# Patient Record
Sex: Female | Born: 1945 | Race: White | Hispanic: No | Smoking: Former smoker
Health system: Southern US, Community
[De-identification: ages and names within clinical notes are randomized; demographics above are authoritative.]

## PROBLEM LIST (undated history)

## (undated) DIAGNOSIS — E785 Hyperlipidemia, unspecified: Secondary | ICD-10-CM

## (undated) DIAGNOSIS — I213 ST elevation (STEMI) myocardial infarction of unspecified site: Secondary | ICD-10-CM

## (undated) DIAGNOSIS — I10 Essential (primary) hypertension: Secondary | ICD-10-CM

## (undated) DIAGNOSIS — I5021 Acute systolic (congestive) heart failure: Secondary | ICD-10-CM

## (undated) HISTORY — PX: OVARY SURGERY: SHX727

## (undated) HISTORY — PX: FRACTURE SURGERY: SHX138

---

## 2001-09-13 ENCOUNTER — Encounter: Payer: Self-pay | Admitting: Obstetrics and Gynecology

## 2001-09-13 ENCOUNTER — Ambulatory Visit (HOSPITAL_COMMUNITY): Admission: RE | Admit: 2001-09-13 | Discharge: 2001-09-13 | Payer: Self-pay | Admitting: Obstetrics and Gynecology

## 2001-09-22 ENCOUNTER — Other Ambulatory Visit: Admission: RE | Admit: 2001-09-22 | Discharge: 2001-09-22 | Payer: Self-pay | Admitting: Obstetrics and Gynecology

## 2001-12-28 ENCOUNTER — Ambulatory Visit: Admission: RE | Admit: 2001-12-28 | Discharge: 2001-12-28 | Payer: Self-pay | Admitting: Obstetrics and Gynecology

## 2009-08-07 ENCOUNTER — Ambulatory Visit (HOSPITAL_COMMUNITY): Admission: RE | Admit: 2009-08-07 | Discharge: 2009-08-07 | Payer: Self-pay | Admitting: Family Medicine

## 2009-08-07 ENCOUNTER — Encounter: Payer: Self-pay | Admitting: Family Medicine

## 2011-05-26 ENCOUNTER — Other Ambulatory Visit (HOSPITAL_COMMUNITY): Payer: Self-pay | Admitting: Family Medicine

## 2011-05-26 DIAGNOSIS — Z139 Encounter for screening, unspecified: Secondary | ICD-10-CM

## 2011-05-29 ENCOUNTER — Ambulatory Visit (HOSPITAL_COMMUNITY)
Admission: RE | Admit: 2011-05-29 | Discharge: 2011-05-29 | Disposition: A | Discharge status: Home / Self Care | Payer: BC Managed Care – PPO | Source: Ambulatory Visit | Attending: Family Medicine | Admitting: Family Medicine

## 2011-05-29 DIAGNOSIS — Z1231 Encounter for screening mammogram for malignant neoplasm of breast: Secondary | ICD-10-CM | POA: Insufficient documentation

## 2011-05-29 DIAGNOSIS — Z139 Encounter for screening, unspecified: Secondary | ICD-10-CM

## 2012-07-16 ENCOUNTER — Ambulatory Visit (HOSPITAL_COMMUNITY)
Admission: RE | Admit: 2012-07-16 | Discharge: 2012-07-16 | Disposition: A | Discharge status: Home / Self Care | Payer: BC Managed Care – PPO | Source: Ambulatory Visit | Attending: Family Medicine | Admitting: Family Medicine

## 2012-07-16 ENCOUNTER — Other Ambulatory Visit (HOSPITAL_COMMUNITY): Payer: Self-pay | Admitting: Family Medicine

## 2012-07-16 DIAGNOSIS — R52 Pain, unspecified: Secondary | ICD-10-CM

## 2012-07-16 DIAGNOSIS — M25569 Pain in unspecified knee: Secondary | ICD-10-CM | POA: Insufficient documentation

## 2013-08-01 ENCOUNTER — Other Ambulatory Visit (HOSPITAL_COMMUNITY): Payer: Self-pay | Admitting: Family Medicine

## 2013-08-01 DIAGNOSIS — Z139 Encounter for screening, unspecified: Secondary | ICD-10-CM

## 2013-08-03 ENCOUNTER — Ambulatory Visit (HOSPITAL_COMMUNITY)
Admission: RE | Admit: 2013-08-03 | Discharge: 2013-08-03 | Disposition: A | Discharge status: Home / Self Care | Payer: BC Managed Care – PPO | Source: Ambulatory Visit | Attending: Family Medicine | Admitting: Family Medicine

## 2013-08-03 DIAGNOSIS — Z139 Encounter for screening, unspecified: Secondary | ICD-10-CM

## 2013-08-03 DIAGNOSIS — Z1231 Encounter for screening mammogram for malignant neoplasm of breast: Secondary | ICD-10-CM | POA: Insufficient documentation

## 2013-08-04 ENCOUNTER — Ambulatory Visit (HOSPITAL_COMMUNITY): Payer: BC Managed Care – PPO

## 2014-07-23 ENCOUNTER — Other Ambulatory Visit (HOSPITAL_COMMUNITY): Payer: Self-pay | Admitting: Family Medicine

## 2014-07-23 DIAGNOSIS — Z1231 Encounter for screening mammogram for malignant neoplasm of breast: Secondary | ICD-10-CM

## 2014-08-09 ENCOUNTER — Ambulatory Visit (HOSPITAL_COMMUNITY): Payer: BC Managed Care – PPO

## 2014-08-20 ENCOUNTER — Ambulatory Visit (HOSPITAL_COMMUNITY)
Admission: RE | Admit: 2014-08-20 | Discharge: 2014-08-20 | Disposition: A | Discharge status: Home / Self Care | Payer: BC Managed Care – PPO | Source: Ambulatory Visit | Attending: Family Medicine | Admitting: Family Medicine

## 2014-08-20 DIAGNOSIS — Z1231 Encounter for screening mammogram for malignant neoplasm of breast: Secondary | ICD-10-CM | POA: Diagnosis not present

## 2014-12-29 ENCOUNTER — Encounter: Payer: Self-pay | Admitting: Podiatry

## 2014-12-29 ENCOUNTER — Ambulatory Visit (INDEPENDENT_AMBULATORY_CARE_PROVIDER_SITE_OTHER): Payer: BC Managed Care – PPO | Admitting: Podiatry

## 2014-12-29 VITALS — BP 155/81 | HR 73 | Resp 18

## 2014-12-29 DIAGNOSIS — B351 Tinea unguium: Secondary | ICD-10-CM

## 2014-12-29 DIAGNOSIS — L600 Ingrowing nail: Secondary | ICD-10-CM

## 2014-12-29 MED ORDER — TAVABOROLE 5 % EX SOLN: 1.0000 [drp] | CUTANEOUS | Status: DC

## 2014-12-29 NOTE — Progress Notes (Signed)
   Subjective:    Patient ID: Rachel Kennedy, female    DOB: 04/30/1946, 69 y.o.   MRN: [id number]  HPI  69 year old phenol presents the office today with complaints of bilateral ingrown toenails on both of her big toes. She states that areas are not particularly painful and that she is to hit her toe. She states that she previously had a partial nail avulsion with a chemical wanted to help prevent the corner from reoccurring on the right inside border however since then she states that the right big toenail has started to grow curve towards the outside is abutting the second digit. She denies any recent redness or drainage from the nail sites. No other complaints at this time.   Review of Systems  All other systems reviewed and are negative.      Objective:   Physical Exam AAO x3, NAD DP/PT pulses palpable bilaterally, CRT less than 3 seconds Protective sensation intact with Simms Weinstein monofilament, vibratory sensation intact, Achilles tendon reflex intact Nails are hypertrophic, yellow discoloration, dystrophic 10. There is incurvation of both the medial and lateral nail borders of all nails. The right hallux nail medial nail border has previously been removed. The right hallux nail is laterally deviated and is abutting the second digit. There is significant ingrowing along the medial border of the left hallux.There is no swelling erythema or drainage from the nail sites.  Currently, there is no tenderness to palpation along the nail border sites. No overlying edema, erythema, increased warmth bilateral lower extremities. No areas of pinpoint bony tenderness to bilateral lower extremities. MMT 5/5, ROM WNL.  No pain with calf compression, swelling, warmth, erythema.      Assessment & Plan:  69 year old female with ingrown toenail deformities, likely onychomycosis. -Treatment options were discussed with the patient including alternatives, risks, complications. - Nails were sharply  debrided 10 without complications/bleeding to patient comfort.  -Discussed with her that she likely has nail fungus. Discussed various treatments of nail fungus. At this time she is not to proceed with topical treatment and she is allowed to proceed with Rachel Kennedy. Prescription for this was sent to [facility] and she was given information to follow-up at the pharmacy. Side effects the medication were discussed the patient and directed to stop immediately if any are to occur and call the office. -Discussed possible partial nail avulsions with chemical matricectomy to help prevent the nail borders from reoccurring. Will continue to monitor the area. If she has reoccurrence or pain to the area she will consider doing this. -Follow-up as needed. In the meantime, encouraged call the office with any questions, concerns, changes symptoms. -

## 2014-12-29 NOTE — Patient Instructions (Signed)
Onychomycosis/Fungal Toenails  WHAT IS IT? An infection that lies within the keratin of your nail plate that is caused by a fungus.  WHY ME? Fungal infections affect all ages, sexes, races, and creeds.  There may be many factors that predispose you to a fungal infection such as age, coexisting medical conditions such as diabetes, or an autoimmune disease; stress, medications, fatigue, genetics, etc.  Bottom line: fungus thrives in a warm, moist environment and your shoes offer such a location.  IS IT CONTAGIOUS? Theoretically, yes.  You do not want to share shoes, nail clippers or files with someone who has fungal toenails.  Walking around barefoot in the same room or sleeping in the same bed is unlikely to transfer the organism.  It is important to realize, however, that fungus can spread easily from one nail to the next on the same foot.  HOW DO WE TREAT THIS?  There are several ways to treat this condition.  Treatment may depend on many factors such as age, medications, pregnancy, liver and kidney conditions, etc.  It is best to ask your doctor which options are available to you.  1. No treatment.   Unlike many other medical concerns, you can live with this condition.  However for many people this can be a painful condition and may lead to ingrown toenails or a bacterial infection.  It is recommended that you keep the nails cut short to help reduce the amount of fungal nail. 2. Topical treatment.  These range from herbal remedies to prescription strength nail lacquers.  About 40-50% effective, topicals require twice daily application for approximately 9 to 12 months or until an entirely new nail has grown out.  The most effective topicals are medical grade medications available through physicians offices. 3. Oral antifungal medications.  With an 80-90% cure rate, the most common oral medication requires 3 to 4 months of therapy and stays in your system for a year as the new nail grows out.  Oral  antifungal medications do require blood work to make sure it is a safe drug for you.  A liver function panel will be performed prior to starting the medication and after the first month of treatment.  It is important to have the blood work performed to avoid any harmful side effects.  In general, this medication safe but blood work is required. 4. Laser Therapy.  This treatment is performed by applying a specialized laser to the affected nail plate.  This therapy is noninvasive, fast, and non-painful.  It is not covered by insurance and is therefore, out of pocket.  The results have been very good with a 80-95% cure rate.  [facility] is the only practice in the area to offer this therapy. Permanent Nail Avulsion.  Removing the entire nail so that a new nail will not grow back.Ingrown Toenail An ingrown toenail occurs when the sharp edge of your toenail grows into the skin. Causes of ingrown toenails include toenails clipped too far back or poorly fitting shoes. Activities involving sudden stops (basketball, tennis) causing "toe jamming" may lead to an ingrown nail. HOME CARE INSTRUCTIONS  5. Soak the whole foot in warm soapy water for 20 minutes, 3 times per day. 6. You may lift the edge of the nail away from the sore skin by wedging a small piece of cotton under the corner of the nail. Be careful not to dig (traumatize) and cause more injury to the area. 7. Wear shoes that fit well.  While the ingrown nail is causing problems, sandals may be beneficial. 8. Trim your toenails regularly and carefully. Cut your toenails straight across, not in a curve. This will prevent injury to the skin at the corners of the toenail. 9. Keep your feet clean and dry. 10. Crutches may be helpful early in treatment if walking is painful. 11. Antibiotics, if prescribed, should be taken as directed. 12. Return for a wound check in 2 days or as directed. 13. Only take over-the-counter or prescription medicines for  pain, discomfort, or fever as directed by your caregiver. SEEK IMMEDIATE MEDICAL CARE IF:   You have a fever.  You have increasing pain, redness, swelling, or heat at the wound site.  Your toe is not better in 7 days. If conservative treatment is not successful, surgical removal of a portion or all of the nail may be necessary. MAKE SURE YOU:   Understand these instructions.  Will watch your condition.  Will get help right away if you are not doing well or get worse. Document Released: 12/04/2000 Document Revised: 02/29/2012 Document Reviewed: 11/28/2008 [facility] Patient Information 2015 [place], [place]. This information is not intended to replace advice given to you by your health care provider. Make sure you discuss any questions you have with your health care provider.

## 2015-12-06 ENCOUNTER — Other Ambulatory Visit: Payer: Self-pay | Admitting: Family Medicine

## 2015-12-10 ENCOUNTER — Other Ambulatory Visit: Payer: Self-pay

## 2015-12-11 ENCOUNTER — Other Ambulatory Visit: Payer: Self-pay | Admitting: Family Medicine

## 2016-03-30 ENCOUNTER — Other Ambulatory Visit (HOSPITAL_COMMUNITY): Payer: Self-pay | Admitting: Family Medicine

## 2016-03-30 DIAGNOSIS — Z1231 Encounter for screening mammogram for malignant neoplasm of breast: Secondary | ICD-10-CM

## 2016-04-01 ENCOUNTER — Ambulatory Visit (HOSPITAL_COMMUNITY)
Admission: RE | Admit: 2016-04-01 | Discharge: 2016-04-01 | Disposition: A | Discharge status: Home / Self Care | Payer: BC Managed Care – PPO | Source: Ambulatory Visit | Attending: Family Medicine | Admitting: Family Medicine

## 2016-04-01 DIAGNOSIS — Z1231 Encounter for screening mammogram for malignant neoplasm of breast: Secondary | ICD-10-CM | POA: Diagnosis present

## 2016-08-05 ENCOUNTER — Other Ambulatory Visit (HOSPITAL_COMMUNITY): Payer: Self-pay | Admitting: Registered Nurse

## 2016-08-05 DIAGNOSIS — M2550 Pain in unspecified joint: Secondary | ICD-10-CM

## 2016-08-10 ENCOUNTER — Ambulatory Visit (HOSPITAL_COMMUNITY)
Admission: RE | Admit: 2016-08-10 | Discharge: 2016-08-10 | Disposition: A | Discharge status: Home / Self Care | Payer: Medicare Other | Source: Ambulatory Visit | Attending: Registered Nurse | Admitting: Registered Nurse

## 2016-08-10 DIAGNOSIS — M169 Osteoarthritis of hip, unspecified: Secondary | ICD-10-CM | POA: Diagnosis not present

## 2016-08-10 DIAGNOSIS — M2550 Pain in unspecified joint: Secondary | ICD-10-CM | POA: Diagnosis not present

## 2016-08-10 DIAGNOSIS — M19049 Primary osteoarthritis, unspecified hand: Secondary | ICD-10-CM | POA: Insufficient documentation

## 2018-03-07 ENCOUNTER — Other Ambulatory Visit (HOSPITAL_COMMUNITY): Payer: Self-pay | Admitting: Family Medicine

## 2018-03-07 DIAGNOSIS — Z1231 Encounter for screening mammogram for malignant neoplasm of breast: Secondary | ICD-10-CM

## 2018-03-09 ENCOUNTER — Ambulatory Visit (HOSPITAL_COMMUNITY)
Admission: RE | Admit: 2018-03-09 | Discharge: 2018-03-09 | Disposition: A | Discharge status: Home / Self Care | Payer: Medicare Other | Source: Ambulatory Visit | Attending: Family Medicine | Admitting: Family Medicine

## 2018-03-09 DIAGNOSIS — Z1231 Encounter for screening mammogram for malignant neoplasm of breast: Secondary | ICD-10-CM | POA: Insufficient documentation

## 2019-02-09 ENCOUNTER — Other Ambulatory Visit: Payer: Self-pay

## 2019-02-09 ENCOUNTER — Inpatient Hospital Stay (HOSPITAL_COMMUNITY)
Admission: EM | Disposition: A | Discharge status: Home / Self Care | Payer: Self-pay | Source: Home / Self Care | Attending: Cardiology

## 2019-02-09 ENCOUNTER — Emergency Department (HOSPITAL_COMMUNITY): Payer: Medicare Other

## 2019-02-09 ENCOUNTER — Inpatient Hospital Stay (HOSPITAL_COMMUNITY)
Admission: EM | Admit: 2019-02-09 | Discharge: 2019-02-11 | DRG: 246 | Disposition: A | Discharge status: Home / Self Care | Payer: Medicare Other | Attending: Cardiology | Admitting: Cardiology

## 2019-02-09 ENCOUNTER — Encounter (HOSPITAL_COMMUNITY): Payer: Self-pay | Admitting: Emergency Medicine

## 2019-02-09 DIAGNOSIS — Z8249 Family history of ischemic heart disease and other diseases of the circulatory system: Secondary | ICD-10-CM | POA: Diagnosis not present

## 2019-02-09 DIAGNOSIS — E785 Hyperlipidemia, unspecified: Secondary | ICD-10-CM | POA: Diagnosis present

## 2019-02-09 DIAGNOSIS — Z87891 Personal history of nicotine dependence: Secondary | ICD-10-CM

## 2019-02-09 DIAGNOSIS — I2109 ST elevation (STEMI) myocardial infarction involving other coronary artery of anterior wall: Secondary | ICD-10-CM | POA: Diagnosis not present

## 2019-02-09 DIAGNOSIS — I2102 ST elevation (STEMI) myocardial infarction involving left anterior descending coronary artery: Secondary | ICD-10-CM | POA: Diagnosis present

## 2019-02-09 DIAGNOSIS — I2589 Other forms of chronic ischemic heart disease: Secondary | ICD-10-CM | POA: Diagnosis not present

## 2019-02-09 DIAGNOSIS — I2510 Atherosclerotic heart disease of native coronary artery without angina pectoris: Secondary | ICD-10-CM | POA: Diagnosis present

## 2019-02-09 DIAGNOSIS — I255 Ischemic cardiomyopathy: Secondary | ICD-10-CM | POA: Diagnosis present

## 2019-02-09 DIAGNOSIS — Z79899 Other long term (current) drug therapy: Secondary | ICD-10-CM | POA: Diagnosis not present

## 2019-02-09 DIAGNOSIS — R079 Chest pain, unspecified: Secondary | ICD-10-CM | POA: Diagnosis present

## 2019-02-09 DIAGNOSIS — Z955 Presence of coronary angioplasty implant and graft: Secondary | ICD-10-CM

## 2019-02-09 DIAGNOSIS — I110 Hypertensive heart disease with heart failure: Secondary | ICD-10-CM | POA: Diagnosis present

## 2019-02-09 DIAGNOSIS — I5021 Acute systolic (congestive) heart failure: Secondary | ICD-10-CM | POA: Diagnosis present

## 2019-02-09 HISTORY — DX: Essential (primary) hypertension: I10

## 2019-02-09 HISTORY — DX: Acute systolic (congestive) heart failure: I50.21

## 2019-02-09 HISTORY — PX: LEFT HEART CATH AND CORONARY ANGIOGRAPHY: CATH118249

## 2019-02-09 HISTORY — DX: Hyperlipidemia, unspecified: E78.5

## 2019-02-09 HISTORY — PX: CORONARY/GRAFT ACUTE MI REVASCULARIZATION: CATH118305

## 2019-02-09 HISTORY — DX: ST elevation (STEMI) myocardial infarction of unspecified site: I21.3

## 2019-02-09 LAB — CBC
HCT: 47.9 % — ABNORMAL HIGH (ref 36.0–46.0)
HCT: 41.5 % (ref 36.0–46.0)
Hemoglobin: 15.2 g/dL — ABNORMAL HIGH (ref 12.0–15.0)
Hemoglobin: 13.6 g/dL (ref 12.0–15.0)
MCH: 26.5 pg (ref 26.0–34.0)
MCH: 26.5 pg (ref 26.0–34.0)
MCHC: 31.7 g/dL (ref 30.0–36.0)
MCHC: 32.8 g/dL (ref 30.0–36.0)
MCV: 83.4 fL (ref 80.0–100.0)
MCV: 80.7 fL (ref 80.0–100.0)
PLATELETS: 200 10*3/uL (ref 150–400)
Platelets: 219 K/uL (ref 150–400)
RBC: 5.74 MIL/uL — ABNORMAL HIGH (ref 3.87–5.11)
RBC: 5.14 MIL/uL — ABNORMAL HIGH (ref 3.87–5.11)
RDW: 14 % (ref 11.5–15.5)
RDW: 13.6 % (ref 11.5–15.5)
WBC: 11 K/uL — ABNORMAL HIGH (ref 4.0–10.5)
WBC: 10.5 10*3/uL (ref 4.0–10.5)
nRBC: 0 % (ref 0.0–0.2)
nRBC: 0 % (ref 0.0–0.2)

## 2019-02-09 LAB — HEMOGLOBIN A1C
Hgb A1c MFr Bld: 5.8 % — ABNORMAL HIGH (ref 4.8–5.6)
Mean Plasma Glucose: 119.76 mg/dL

## 2019-02-09 LAB — BASIC METABOLIC PANEL WITH GFR
Anion gap: 11 (ref 5–15)
BUN: 21 mg/dL (ref 8–23)
CO2: 20 mmol/L — ABNORMAL LOW (ref 22–32)
Calcium: 9.7 mg/dL (ref 8.9–10.3)
Chloride: 102 mmol/L (ref 98–111)
Creatinine, Ser: 0.8 mg/dL (ref 0.44–1.00)
GFR calc Af Amer: >60 mL/min
GFR calc non Af Amer: >60 mL/min
Glucose, Bld: 131 mg/dL — ABNORMAL HIGH (ref 70–99)
Potassium: 3.8 mmol/L (ref 3.5–5.1)
Sodium: 133 mmol/L — ABNORMAL LOW (ref 135–145)

## 2019-02-09 LAB — POCT I-STAT TROPONIN I: Troponin i, poc: 0.24 ng/mL (ref 0.00–0.08)

## 2019-02-09 LAB — COMPREHENSIVE METABOLIC PANEL
ALT: 40 U/L (ref 0–44)
AST: 149 U/L — ABNORMAL HIGH (ref 15–41)
Albumin: 3.8 g/dL (ref 3.5–5.0)
Alkaline Phosphatase: 70 U/L (ref 38–126)
Anion gap: 8 (ref 5–15)
BUN: 14 mg/dL (ref 8–23)
CO2: 21 mmol/L — ABNORMAL LOW (ref 22–32)
Calcium: 9.2 mg/dL (ref 8.9–10.3)
Chloride: 105 mmol/L (ref 98–111)
Creatinine, Ser: 0.77 mg/dL (ref 0.44–1.00)
GFR calc Af Amer: >60 mL/min (ref >60)
GFR calc non Af Amer: >60 mL/min (ref >60)
Glucose, Bld: 145 mg/dL — ABNORMAL HIGH (ref 70–99)
Potassium: 3.9 mmol/L (ref 3.5–5.1)
Sodium: 134 mmol/L — ABNORMAL LOW (ref 135–145)
Total Bilirubin: 0.7 mg/dL (ref 0.3–1.2)
Total Protein: 6.4 g/dL — ABNORMAL LOW (ref 6.5–8.1)

## 2019-02-09 LAB — POCT ACTIVATED CLOTTING TIME: Activated Clotting Time: 390 seconds

## 2019-02-09 LAB — MRSA PCR SCREENING: MRSA by PCR: NEGATIVE

## 2019-02-09 LAB — TROPONIN I: Troponin I: 57.07 ng/mL (ref <0.03)

## 2019-02-09 LAB — TSH: TSH: 0.648 u[IU]/mL (ref 0.350–4.500)

## 2019-02-09 SURGERY — CORONARY/GRAFT ACUTE MI REVASCULARIZATION
Anesthesia: LOCAL

## 2019-02-09 MED ORDER — TIROFIBAN (AGGRASTAT) BOLUS VIA INFUSION
INTRAVENOUS | Status: DC | PRN
  Administered 2019-02-09: 2040 ug via INTRAVENOUS

## 2019-02-09 MED ORDER — TICAGRELOR 90 MG PO TABS
ORAL_TABLET | ORAL | Status: DC | PRN
  Administered 2019-02-09 (×2): 180 mg via ORAL

## 2019-02-09 MED ORDER — SODIUM CHLORIDE 0.9 % IV SOLN
INTRAVENOUS | Status: AC | PRN
  Administered 2019-02-09: 10 mL/h via INTRAVENOUS

## 2019-02-09 MED ORDER — SODIUM CHLORIDE 0.9 % IV SOLN: 250.0000 mL | INTRAVENOUS | Status: DC | PRN

## 2019-02-09 MED ORDER — SODIUM CHLORIDE 0.9% FLUSH: 3.0000 mL | Freq: Once | INTRAVENOUS | Status: DC

## 2019-02-09 MED ORDER — TIROFIBAN HCL IN NACL 5-0.9 MG/100ML-% IV SOLN
INTRAVENOUS | Status: AC | PRN
  Administered 2019-02-09: 0.15 ug/kg/min via INTRAVENOUS

## 2019-02-09 MED ORDER — HEPARIN (PORCINE) IN NACL 1000-0.9 UT/500ML-% IV SOLN
INTRAVENOUS | Status: DC | PRN
  Administered 2019-02-09 (×2): 500 mL

## 2019-02-09 MED ORDER — ACETAMINOPHEN 325 MG PO TABS: 650.0000 mg | ORAL_TABLET | ORAL | Status: DC | PRN

## 2019-02-09 MED ORDER — ENOXAPARIN SODIUM 40 MG/0.4ML ~~LOC~~ SOLN
40.0000 mg | SUBCUTANEOUS | Status: DC
  Administered 2019-02-10 – 2019-02-11 (×2): 40 mg via SUBCUTANEOUS
  Filled 2019-02-09 (×2): qty 0.4

## 2019-02-09 MED ORDER — HEPARIN SODIUM (PORCINE) 5000 UNIT/ML IJ SOLN
INTRAMUSCULAR | Status: AC
  Administered 2019-02-09: 5000 [IU]
  Filled 2019-02-09: qty 1

## 2019-02-09 MED ORDER — ASPIRIN 81 MG PO CHEW
CHEWABLE_TABLET | ORAL | Status: AC
  Administered 2019-02-09: 243 mg
  Filled 2019-02-09: qty 4

## 2019-02-09 MED ORDER — ONDANSETRON HCL 4 MG/2ML IJ SOLN: 4.0000 mg | Freq: Four times a day (QID) | INTRAMUSCULAR | Status: DC | PRN

## 2019-02-09 MED ORDER — ATORVASTATIN CALCIUM 80 MG PO TABS
80.0000 mg | ORAL_TABLET | Freq: Every day | ORAL | Status: DC
  Administered 2019-02-09 – 2019-02-10 (×2): 80 mg via ORAL
  Filled 2019-02-09 (×2): qty 1

## 2019-02-09 MED ORDER — HEPARIN SODIUM (PORCINE) 1000 UNIT/ML IJ SOLN
INTRAMUSCULAR | Status: DC | PRN
  Administered 2019-02-09: 5000 [IU] via INTRAVENOUS

## 2019-02-09 MED ORDER — SERTRALINE HCL 100 MG PO TABS
100.0000 mg | ORAL_TABLET | Freq: Every day | ORAL | Status: DC
  Administered 2019-02-09 – 2019-02-10 (×2): 100 mg via ORAL
  Filled 2019-02-09 (×2): qty 1

## 2019-02-09 MED ORDER — TICAGRELOR 90 MG PO TABS
90.0000 mg | ORAL_TABLET | Freq: Two times a day (BID) | ORAL | Status: DC
  Administered 2019-02-10 – 2019-02-11 (×3): 90 mg via ORAL
  Filled 2019-02-09 (×4): qty 1

## 2019-02-09 MED ORDER — LIDOCAINE HCL (PF) 1 % IJ SOLN
INTRAMUSCULAR | Status: DC | PRN
  Administered 2019-02-09: 2 mL via INTRADERMAL

## 2019-02-09 MED ORDER — SODIUM CHLORIDE 0.9% FLUSH
3.0000 mL | Freq: Two times a day (BID) | INTRAVENOUS | Status: DC
  Administered 2019-02-10 – 2019-02-11 (×3): 3 mL via INTRAVENOUS

## 2019-02-09 MED ORDER — TIROFIBAN HCL IN NACL 5-0.9 MG/100ML-% IV SOLN
0.1500 ug/kg/min | INTRAVENOUS | Status: AC
  Administered 2019-02-09: 0.15 ug/kg/min via INTRAVENOUS
  Filled 2019-02-09: qty 100

## 2019-02-09 MED ORDER — VERAPAMIL HCL 2.5 MG/ML IV SOLN
INTRAVENOUS | Status: DC | PRN
  Administered 2019-02-09: 10 mL via INTRA_ARTERIAL

## 2019-02-09 MED ORDER — BUPROPION HCL ER (XL) 150 MG PO TB24
300.0000 mg | ORAL_TABLET | Freq: Every day | ORAL | Status: DC
  Administered 2019-02-09 – 2019-02-10 (×2): 300 mg via ORAL
  Filled 2019-02-09 (×2): qty 2

## 2019-02-09 MED ORDER — SODIUM CHLORIDE 0.9 % WEIGHT BASED INFUSION: 1.0000 mL/kg/h | INTRAVENOUS | Status: AC

## 2019-02-09 MED ORDER — IOHEXOL 350 MG/ML SOLN
INTRAVENOUS | Status: DC | PRN
  Administered 2019-02-09: 130 mL via INTRAVENOUS

## 2019-02-09 MED ORDER — ASPIRIN 81 MG PO CHEW
81.0000 mg | CHEWABLE_TABLET | Freq: Every day | ORAL | Status: DC
  Administered 2019-02-10 – 2019-02-11 (×2): 81 mg via ORAL
  Filled 2019-02-09 (×3): qty 1

## 2019-02-09 MED ORDER — NITROGLYCERIN 0.4 MG SL SUBL: 0.4000 mg | SUBLINGUAL_TABLET | SUBLINGUAL | Status: DC | PRN

## 2019-02-09 MED ORDER — LOSARTAN POTASSIUM 25 MG PO TABS
25.0000 mg | ORAL_TABLET | Freq: Every day | ORAL | Status: DC
  Administered 2019-02-09 – 2019-02-11 (×3): 25 mg via ORAL
  Filled 2019-02-09 (×4): qty 1

## 2019-02-09 MED ORDER — ENOXAPARIN SODIUM 40 MG/0.4ML ~~LOC~~ SOLN: 40.0000 mg | SUBCUTANEOUS | Status: DC

## 2019-02-09 MED ORDER — NITROGLYCERIN 1 MG/10 ML FOR IR/CATH LAB
INTRA_ARTERIAL | Status: DC | PRN
  Administered 2019-02-09 (×2): 200 ug via INTRACORONARY
  Administered 2019-02-09: 200 ug

## 2019-02-09 MED ORDER — SODIUM CHLORIDE 0.9% FLUSH: 3.0000 mL | INTRAVENOUS | Status: DC | PRN

## 2019-02-09 MED ORDER — ONDANSETRON HCL 4 MG/2ML IJ SOLN
INTRAMUSCULAR | Status: DC | PRN
  Administered 2019-02-09: 4 mg via INTRAVENOUS

## 2019-02-09 MED ORDER — CARVEDILOL 6.25 MG PO TABS
6.2500 mg | ORAL_TABLET | Freq: Two times a day (BID) | ORAL | Status: DC
  Administered 2019-02-09 – 2019-02-11 (×4): 6.25 mg via ORAL
  Filled 2019-02-09 (×4): qty 1

## 2019-02-09 SURGICAL SUPPLY — 18 items
BALLN SAPPHIRE 2.5X12 (BALLOONS) ×2
BALLN SAPPHIRE ~~LOC~~ 3.25X18 (BALLOONS) ×1 IMPLANT
BALLOON SAPPHIRE 2.5X12 (BALLOONS) IMPLANT
CATH INFINITI 5FR ANG PIGTAIL (CATHETERS) ×1 IMPLANT
CATH INFINITI JR4 5F (CATHETERS) ×1 IMPLANT
CATH VISTA GUIDE 6FR XBLAD3.5 (CATHETERS) ×1 IMPLANT
DEVICE RAD COMP TR BAND LRG (VASCULAR PRODUCTS) ×1 IMPLANT
GLIDESHEATH SLEND SS 6F .021 (SHEATH) ×1 IMPLANT
GUIDEWIRE INQWIRE 1.5J.035X260 (WIRE) IMPLANT
INQWIRE 1.5J .035X260CM (WIRE) ×2
KIT ENCORE 26 ADVANTAGE (KITS) ×1 IMPLANT
KIT HEART LEFT (KITS) ×2 IMPLANT
PACK CARDIAC CATHETERIZATION (CUSTOM PROCEDURE TRAY) ×2 IMPLANT
STENT RESOLUTE ONYX 3.0X30 (Permanent Stent) ×1 IMPLANT
SYR MEDRAD MARK 7 150ML (SYRINGE) ×1 IMPLANT
TRANSDUCER W/STOPCOCK (MISCELLANEOUS) ×2 IMPLANT
TUBING CIL FLEX 10 FLL-RA (TUBING) ×2 IMPLANT
WIRE ASAHI PROWATER 180CM (WIRE) ×1 IMPLANT

## 2019-02-09 NOTE — Care Management (Signed)
Benefits check sent and pending for Brilinta.  Colleen Can RN, BSN, NCM-BC, ACM-RN [phone]

## 2019-02-09 NOTE — ED Notes (Signed)
CRITICAL VALUE ALERT  Critical Value:  Troponin 0.24  Date & Time Notied:  02/09/2019  1430  Provider Notified: Jeraldine Loots  Orders Received/Actions taken: Charge nurse notified Dyanne Iha R.N.

## 2019-02-09 NOTE — ED Provider Notes (Signed)
[facility] EMERGENCY DEPARTMENT Provider Note   CSN: [id number] Arrival date & time: 02/09/19  1356    History   Chief Complaint Chief Complaint  Patient presents with  . Code STEMI    HPI Rachel Kennedy is a 73 y.o. female.     HPI Patient presents due to chest pain. Patient states that she is generally well has no history of hypertension, diabetes or known cardiac disease. Within the past 3 hours or so the patient developed left-sided sharp pain. There is some pressure component, but she describes the pain essentially as being sharp. Pain was more severe initially, improved minimally with aspirin, 81 mg There is associated mild generalized discomfort, but no dyspnea or other focal changes. She notes that she had a similar episode, though more transient, milder about 2 weeks ago, following which she did not seek medical care. She is here with her husband who assists with the HPI after the initial intervention, evaluation. History reviewed. No pertinent past medical history.  There are no active problems to display for this patient.   Past Surgical History:  Procedure Laterality Date  . FRACTURE SURGERY    . OVARY SURGERY       OB History   No obstetric history on file.      Home Medications    Prior to Admission medications   Medication Sig Start Date End Date Taking? Authorizing Provider  buPROPion (WELLBUTRIN XL) 300 MG 24 hr tablet Take 300 mg by mouth daily. 10/04/14   [provider]  doxycycline (PERIOSTAT) 20 MG tablet Take 20 mg by mouth 2 (two) times daily. 11/17/14   [provider]  RESTASIS 0.05 % ophthalmic emulsion  11/19/14   [provider]  sertraline (ZOLOFT) 100 MG tablet Take 100 mg by mouth daily.    [provider]  Tavaborole (KERYDIN) 5 % SOLN Apply 1 drop topically 1 day or 1 dose. Apply 1 drop to the toenail daily. 12/29/14   Vivi Barrack, DPM    Family History Family History  Problem  Relation Age of Onset  . Heart disease Father     Social History Social History   Tobacco Use  . Smoking status: Never Smoker  . Smokeless tobacco: Never Used  Substance Use Topics  . Alcohol use: No    Alcohol/week: 0.0 standard drinks  . Drug use: No     Allergies   Patient has no known allergies.   Review of Systems Review of Systems  Constitutional:       Per HPI, otherwise negative  HENT:       Per HPI, otherwise negative  Respiratory:       Per HPI, otherwise negative  Cardiovascular:       Per HPI, otherwise negative  Gastrointestinal: Negative for vomiting.  Endocrine:       Negative aside from HPI  Genitourinary:       Neg aside from HPI   Musculoskeletal:       Per HPI, otherwise negative  Skin: Negative.   Neurological: Negative for syncope.  Psychiatric/Behavioral: Positive for dysphoric mood.     Physical Exam Updated Vital Signs BP (!) 192/90 (BP Location: Right Arm)   Pulse 80   Temp 98.1 F (36.7 C) (Oral)   Resp (!) 25   Ht 5\' 6"  (1.676 m)   Wt 81.6 kg   SpO2 95%   BMI 29.05 kg/m   Physical Exam Vitals signs and nursing note reviewed.  Constitutional:  General: She is not in acute distress.    Appearance: She is well-developed.  HENT:     Head: Normocephalic and atraumatic.  Eyes:     Conjunctiva/sclera: Conjunctivae normal.  Cardiovascular:     Rate and Rhythm: Normal rate and regular rhythm.  Pulmonary:     Effort: Pulmonary effort is normal. No respiratory distress.     Breath sounds: Normal breath sounds. No stridor.  Abdominal:     General: There is no distension.  Skin:    General: Skin is warm and dry.  Neurological:     Mental Status: She is alert and oriented to person, place, and time.     Cranial Nerves: No cranial nerve deficit.      ED Treatments / Results  Labs (all labs ordered are listed, but only abnormal results are displayed) Labs Reviewed  CBC - Abnormal; Notable for the following  components:      Result Value   WBC 11.0 (*)    RBC 5.74 (*)    Hemoglobin 15.2 (*)    HCT 47.9 (*)    All other components within normal limits  POCT I-STAT TROPONIN I - Abnormal; Notable for the following components:   Troponin i, poc 0.24 (*)    All other components within normal limits  BASIC METABOLIC PANEL  I-STAT TROPONIN, ED    EKG Sinus rhythm, rate 78, acute ischemic changes in the anterior leads abnormal  So ID services are currently unavailable, the EKG itself does not transmit fully, but is available under the images tab.  Radiology Dg Chest Port 1 View  Result Date: 02/09/2019 CLINICAL DATA:  Lambert ModySharp chest pain centered about left breast area. Nausea. EXAM: PORTABLE CHEST 1 VIEW COMPARISON:  None. FINDINGS: Midline trachea. Normal heart size. Atherosclerosis in the transverse aorta. No pleural effusion or pneumothorax. Pulmonary interstitial prominence is felt to be technique related; due to low lung volumes and AP portable technique. Numerous leads and wires project over the chest. No lobar consolidation. No overt congestive failure. IMPRESSION: No acute cardiopulmonary disease. Aortic Atherosclerosis (ICD10-I70.0). Electronically Signed   By: Jeronimo GreavesKyle  Talbot M.D.   On: 02/09/2019 14:38    Procedures Procedures (including critical care time)  Medications Ordered in ED Medications  sodium chloride flush (NS) 0.9 % injection 3 mL (has no administration in time range)  heparin 5000 UNIT/ML injection (5,000 Units  Given 02/09/19 1426)  aspirin 81 MG chewable tablet (243 mg  Given 02/09/19 1425)     Initial Impression / Assessment and Plan / ED Course  I have reviewed the triage vital signs and the nursing notes.  Pertinent labs & imaging results that were available during my care of the patient were reviewed by me and considered in my medical decision making (see chart for details).    With concern for acute ischemic event given her EKG abnormalities, which was repeated  in the room, discussed the patient's case with our cardiology colleagues, and the patient was provided heparin, aspirin. [name] remained 3/10 in the emergency department.     2:35 PM Initial troponin 0 0.24. Patient preparing for transfer to our affiliated catheterization center.  This elderly female with no cardiac disease history, no prior cardiology evaluation presents after an episode of chest pain within the past 2 weeks that was transient, but now with persistent pain, starting earlier today. Patient found to have abnormal EKG concerning for acute ischemic event, and after discussion with cardiology, provision of heparin bolus, aspirin, the patient required  transfer for emergent catheterization.   7:50 PM Cath Lab records reviewed, findings as below: 1. 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 Diffuse 50% proximal to mid RCA 3. Severe LV dysfunction with anterolateral akinesis. EF 30-35%. 4. Normal LVEDP 5. Successful PCI with stenting of the proximal LAD with DES. There is persistent diffuse narrowing of the mid to distal LAD of unclear etiology ? Spasm  Final Clinical Impressions(s) / ED Diagnoses   Final diagnoses:  ST elevation myocardial infarction involving left anterior descending (LAD) coronary artery (HCC)   CRITICAL CARE Performed by: Gerhard Munch Total critical care time: 35 minutes Critical care time was exclusive of separately billable procedures and treating other patients. Critical care was necessary to treat or prevent imminent or life-threatening deterioration. Critical care was time spent personally by me on the following activities: development of treatment plan with patient and/or surrogate as well as nursing, discussions with consultants, evaluation of patient's response to treatment, examination of patient, obtaining history from patient or surrogate, ordering and performing treatments and interventions, ordering and review of laboratory  studies, ordering and review of radiographic studies, pulse oximetry and re-evaluation of patient's condition.    Gerhard Munch, MD 02/09/19 [phone]

## 2019-02-09 NOTE — ED Triage Notes (Signed)
Patient reports onset of sharp chest pain this am, has eased some since 1000. Patient reports pain around the L breast area. Denies SOB, dizziness, or diaphoresis. Patient states she feels nauseated and has had 1 episode of diarrhea this am. No cardiac history.

## 2019-02-09 NOTE — H&P (Signed)
Cardiology Admission History and Physical:   Patient ID: Rachel Kennedy MRN: [id number]; DOB: [birth year]   Admission date: 02/09/2019  Primary Care Provider: Assunta Found, MD Primary Cardiologist: No primary care provider on file.  Primary Electrophysiologist:  None   Chief Complaint:  Chest pain  Patient Profile:   Rachel Kennedy is a 73 y.o. female with PMH of HTN who presented to the [facility] with chest pain.   History of Present Illness:   Rachel Kennedy is a 73 yo female with PMH of HTN. She presented to the [facility] with onset of chest pain that started about 3 hours prior to presenting to the ED. Had associated nausea and diarrhea. States that she had similar pain about 2 weeks prior but did not see anyone for this. In the ED her EKG was concerning for anterior ST elevation and CODE STEMI was called. Pt was given ASA and heparin prior to transfer. Initial troponin was 0.24. CXR was negative.    Past Medical History:  Diagnosis Date  . Hypertension     Past Surgical History:  Procedure Laterality Date  . FRACTURE SURGERY    . OVARY SURGERY       Medications Prior to Admission: Prior to Admission medications   Medication Sig Start Date End Date Taking? Authorizing Provider  buPROPion (WELLBUTRIN XL) 300 MG 24 hr tablet Take 300 mg by mouth daily. 10/04/14   [provider]  doxycycline (PERIOSTAT) 20 MG tablet Take 20 mg by mouth 2 (two) times daily. 11/17/14   [provider]  RESTASIS 0.05 % ophthalmic emulsion  11/19/14   [provider]  sertraline (ZOLOFT) 100 MG tablet Take 100 mg by mouth daily.    [provider]  Tavaborole (KERYDIN) 5 % SOLN Apply 1 drop topically 1 day or 1 dose. Apply 1 drop to the toenail daily. 12/29/14   Vivi Barrack, DPM     Allergies:   No Known Allergies  Social History:   Social History   Socioeconomic History  . Marital status: Significant Other    Spouse name: Not on file  . Number of  children: Not on file  . Years of education: Not on file  . Highest education level: Not on file  Occupational History  . Not on file  Social Needs  . Financial resource strain: Not on file  . Food insecurity:    Worry: Not on file    Inability: Not on file  . Transportation needs:    Medical: Not on file    Non-medical: Not on file  Tobacco Use  . Smoking status: Never Smoker  . Smokeless tobacco: Never Used  Substance and Sexual Activity  . Alcohol use: No    Alcohol/week: 0.0 standard drinks  . Drug use: No  . Sexual activity: Not on file  Lifestyle  . Physical activity:    Days per week: Not on file    Minutes per session: Not on file  . Stress: Not on file  Relationships  . Social connections:    Talks on phone: Not on file    Gets together: Not on file    Attends religious service: Not on file    Active member of club or organization: Not on file    Attends meetings of clubs or organizations: Not on file    Relationship status: Not on file  . Intimate partner violence:    Fear of current or ex partner: Not on file  Emotionally abused: Not on file    Physically abused: Not on file    Forced sexual activity: Not on file  Other Topics Concern  . Not on file  Social History Narrative  . Not on file    Family History:   The patient's family history includes Heart disease in her father.    ROS:  Please see the history of present illness.  All other ROS reviewed and negative.     Physical Exam/Data:   Vitals:   02/09/19 1424 02/09/19 1425 02/09/19 1426 02/09/19 1507  BP:      Pulse: 76 77 80   Resp: 19 18 (!) 25   Temp:      TempSrc:      SpO2: 94% 93% 95% 91%  Weight:      Height:       No intake or output data in the 24 hours ending 02/09/19 1548 Last 3 Weights 02/09/2019  Weight (lbs) 180 lb  Weight (kg) 81.647 kg     Body mass index is 29.05 kg/m.  General:  Older WF HEENT: normal Lymph: no adenopathy Neck: no JVD Cardiac:  normal S1, S2;  RRR; no murmur  Lungs:  clear to auscultation bilaterally, no wheezing, rhonchi or rales  Abd: soft, nontender, no hepatomegaly  Ext: no edema Musculoskeletal:  No deformities, BUE and BLE strength normal and equal Skin: warm and dry  Neuro:  CNs 2-12 intact, no focal abnormalities noted Psych:  Normal affect    EKG:  The ECG that was done 02/09/2019 was personally reviewed and demonstrates SR with ST elevation in anterior leads  Relevant CV Studies:   Laboratory Data:  Chemistry Recent Labs  Lab 02/09/19 1405  NA 133*  K 3.8  CL 102  CO2 20*  GLUCOSE 131*  BUN 21  CREATININE 0.80  CALCIUM 9.7  GFRNONAA >60  GFRAA >60  ANIONGAP 11    No results for input(s): PROT, ALBUMIN, AST, ALT, ALKPHOS, BILITOT in the last 168 hours. Hematology Recent Labs  Lab 02/09/19 1405  WBC 11.0*  RBC 5.74*  HGB 15.2*  HCT 47.9*  MCV 83.4  MCH 26.5  MCHC 31.7  RDW 14.0  PLT 219   Cardiac EnzymesNo results for input(s): TROPONINI in the last 168 hours.  Recent Labs  Lab 02/09/19 1417  TROPIPOC 0.24*    BNPNo results for input(s): BNP, PROBNP in the last 168 hours.  DDimer No results for input(s): DDIMER in the last 168 hours.  Radiology/Studies:  Dg Chest Port 1 View  Result Date: 02/09/2019 CLINICAL DATA:  Lambert Mody chest pain centered about left breast area. Nausea. EXAM: PORTABLE CHEST 1 VIEW COMPARISON:  None. FINDINGS: Midline trachea. Normal heart size. Atherosclerosis in the transverse aorta. No pleural effusion or pneumothorax. Pulmonary interstitial prominence is felt to be technique related; due to low lung volumes and AP portable technique. Numerous leads and wires project over the chest. No lobar consolidation. No overt congestive failure. IMPRESSION: No acute cardiopulmonary disease. Aortic Atherosclerosis (ICD10-I70.0). Electronically Signed   By: Jeronimo Greaves M.D.   On: 02/09/2019 14:38    Assessment and Plan:   Rachel Kennedy is a 73 y.o. female with PMH of HTN  who presented to the [facility] with chest pain.   Acute anterior STEMI: Symptoms started about 3 hours prior to presenting to the ED. EKG with ST elevation in anterior leads. Given ASA and heparin. Transferred to [facility] for emergent cardiac cath. Further recommendations post cath.  Severity of Illness: The appropriate patient status for this patient is INPATIENT. Inpatient status is judged to be reasonable and necessary in order to provide the required intensity of service to ensure the patient's safety. The patient's presenting symptoms, physical exam findings, and initial radiographic and laboratory data in the context of their chronic comorbidities is felt to place them at high risk for further clinical deterioration. Furthermore, it is not anticipated that the patient will be medically stable for discharge from the hospital within 2 midnights of admission. The following factors support the patient status of inpatient.   " The patient's presenting symptoms include chest pain, nausea. " The worrisome physical exam findings include stable findings. " The initial radiographic and laboratory data are worrisome because of EKG concerning for STEMI. " The chronic co-morbidities include HTN.   * I certify that at the point of admission it is my clinical judgment that the patient will require inpatient hospital care spanning beyond 2 midnights from the point of admission due to high intensity of service, high risk for further deterioration and high frequency of surveillance required.*    For questions or updates, please contact [facility] Please consult [email or web address] for contact info under   Signed, Laverda PageLindsay , NP  02/09/2019 3:48 PM

## 2019-02-09 NOTE — ED Notes (Signed)
[facility] here and given report to [facility] at this time.

## 2019-02-09 NOTE — Progress Notes (Signed)
ANTICOAGULATION CONSULT NOTE  Pharmacy Consult for Tirofiban post-PCI  Labs: Recent Labs    02/09/19 1405  HGB 15.2*  HCT 47.9*  PLT 219  CREATININE 0.80    Assessment: 72 yof presenting with STEMI, now s/p cath with DES to LAD. Pt received tirofiban in cath. Pharmacy consulted to continue x 6 hrs post-cath. CBC and SCr wnl. No active bleed issues documented.  Goal of Therapy:  Monitor platelets by anticoagulation protocol: Yes   Plan:  Continue tirofiban 0.15 mcg/kg/min x 6 hrs (stop 2130) Monitor CBC, s/sx bleeding  Babs Bertin, PharmD, BCPS Clinical Pharmacist 02/09/2019 4:32 PM

## 2019-02-09 NOTE — ED Notes (Signed)
All pts belongings (pocketbook, clothing) given to her significant other.

## 2019-02-09 NOTE — ED Notes (Signed)
[facility] called at 2:10 & [facility] sending truck

## 2019-02-10 ENCOUNTER — Inpatient Hospital Stay (HOSPITAL_COMMUNITY): Payer: Medicare Other

## 2019-02-10 ENCOUNTER — Other Ambulatory Visit: Payer: Self-pay

## 2019-02-10 DIAGNOSIS — I2589 Other forms of chronic ischemic heart disease: Secondary | ICD-10-CM

## 2019-02-10 LAB — BASIC METABOLIC PANEL
Anion gap: 11 (ref 5–15)
BUN: 11 mg/dL (ref 8–23)
CO2: 22 mmol/L (ref 22–32)
Calcium: 9 mg/dL (ref 8.9–10.3)
Chloride: 107 mmol/L (ref 98–111)
Creatinine, Ser: 0.76 mg/dL (ref 0.44–1.00)
GFR calc Af Amer: >60 mL/min (ref >60)
GFR calc non Af Amer: >60 mL/min (ref >60)
GLUCOSE: 130 mg/dL — AB (ref 70–99)
Potassium: 3.8 mmol/L (ref 3.5–5.1)
Sodium: 140 mmol/L (ref 135–145)

## 2019-02-10 LAB — ECHOCARDIOGRAM COMPLETE
HEIGHTINCHES: 66 in
WEIGHTICAEL: 2880 [oz_av]

## 2019-02-10 LAB — LIPID PANEL
Cholesterol: 194 mg/dL (ref 0–200)
HDL: 37 mg/dL — ABNORMAL LOW (ref >40)
LDL CALC: 126 mg/dL — AB (ref 0–99)
Total CHOL/HDL Ratio: 5.2 RATIO
Triglycerides: 156 mg/dL — ABNORMAL HIGH (ref <150)
VLDL: 31 mg/dL (ref 0–40)

## 2019-02-10 LAB — TROPONIN I
Troponin I: 57.03 ng/mL (ref <0.03)
Troponin I: 28.23 ng/mL (ref <0.03)

## 2019-02-10 LAB — HIV ANTIBODY (ROUTINE TESTING W REFLEX): HIV Screen 4th Generation wRfx: NONREACTIVE

## 2019-02-10 LAB — CBC
HCT: 42.6 % (ref 36.0–46.0)
Hemoglobin: 13.6 g/dL (ref 12.0–15.0)
MCH: 26.4 pg (ref 26.0–34.0)
MCHC: 31.9 g/dL (ref 30.0–36.0)
MCV: 82.6 fL (ref 80.0–100.0)
Platelets: 192 10*3/uL (ref 150–400)
RBC: 5.16 MIL/uL — ABNORMAL HIGH (ref 3.87–5.11)
RDW: 14 % (ref 11.5–15.5)
WBC: 7.8 10*3/uL (ref 4.0–10.5)
nRBC: 0 % (ref 0.0–0.2)

## 2019-02-10 MED FILL — Hydralazine HCl Inj 20 MG/ML: INTRAMUSCULAR | Qty: 1 | Status: AC

## 2019-02-10 NOTE — Care Management (Signed)
#    4.     S Charlann Noss  @  [facility]  #  [phone]   TICAGRELOR : NONE FORMULARY  BRILINTA    90 MG BID COVER- YES CO-PAY- $ 38.70 TIER- 2 DRUG PRIOR APPROVAL- NO  PREFERRED PHARMACY : [facility]

## 2019-02-10 NOTE — Discharge Instructions (Signed)

## 2019-02-10 NOTE — Care Management (Signed)
#   6.   S/W FOREST  @ [facility] # [phone]  NO DOSAGE GIVEN  1. ENTRESTO  24-26 MG BID COVER- YES CO-PAY- $ 38.70 TIER- 2 DRUG PRIOR APPROVAL- NO  2. ENTRESTO 49-51 MG BID COVER- YES CO-PAY- $ 38.70 TIER- 2 DRUG PRIOR APPROVAL- NO  PREFERRED [facility]ARMACY: [facility]  AND [facility] M/O 90 DAY SUPPLY FOR M/O $ 80.00

## 2019-02-10 NOTE — Progress Notes (Signed)
  Echocardiogram 2D Echocardiogram has been performed.  Delcie Roch 02/10/2019, 10:22 AM

## 2019-02-10 NOTE — Care Management Note (Signed)
Case Management Note  Patient Details  Name: Rachel Kennedy MRN: [id number] Date of Birth: September 17, 1946  Subjective/Objective:  74 yo female presented from [facility] with an anterior STEMI; s/p emergent cath/PCI.                Action/Plan: CM met with patient to discuss transitional needs. Patient states living at home and being independent with her ADLs. PCP verified as: Dr. Sharilyn Sites; [facility] [place]. Brilinta benefits check complete with est monthly cost $38.70, with information discussed with patient and a 30-day free Brilinta card provided. Patient indicated having assistance at home post-transition. No further needs from CM.   Expected Discharge Date:                  Expected Discharge Plan:  Home/Self Care  In-House Referral:  NA  Discharge planning Services  CM Consult, Medication Assistance(Brilinta 30-day free card; Benefits check)  Post Acute Care Choice:  NA Choice offered to:  NA  DME Arranged:  N/A DME Agency:  NA  HH Arranged:  NA HH Agency:  NA  Status of Service:  In process, will continue to follow  If discussed at Long Length of Stay Meetings, dates discussed:    Additional Comments:  Midge Minium RN, BSN, NCM-BC, ACM-RN [phone] 02/10/2019, 12:14 PM

## 2019-02-10 NOTE — Progress Notes (Signed)
Pt just walked with RN. Sts she did well although she did use the EVA. Encouraged her to walk more later without AD. Ed completed with good reception. Discussed MI, stent, Brilinta, restrictions, diet, ex, NTG, and CRPII. I also encouraged her to weigh daily and decrease sodium. Will refer to [facility] CRPII.  [phone] Ethelda Chick CES, ACSM 12:10 PM 02/10/2019

## 2019-02-10 NOTE — Progress Notes (Addendum)
Progress Note  Patient Name: Rachel Kennedy Date of Encounter: 02/10/2019  Primary Cardiologist: new; Dr. Swaziland  Subjective   No recurrent chest pain  Inpatient Medications    Scheduled Meds: . aspirin  81 mg Oral Daily  . atorvastatin  80 mg Oral q1800  . buPROPion  300 mg Oral Daily  . carvedilol  6.25 mg Oral BID WC  . enoxaparin (LOVENOX) injection  40 mg Subcutaneous Q24H  . losartan  25 mg Oral Daily  . sertraline  100 mg Oral Daily  . sodium chloride flush  3 mL Intravenous Once  . sodium chloride flush  3 mL Intravenous Q12H  . ticagrelor  90 mg Oral BID   Continuous Infusions: . sodium chloride     PRN Meds: sodium chloride, acetaminophen, nitroGLYCERIN, ondansetron (ZOFRAN) IV, sodium chloride flush   Vital Signs    Vitals:   02/10/19 0754 02/10/19 0800 02/10/19 0802 02/10/19 0900  BP: (!) 124/57  (!) 113/95   Pulse:      Resp:  13  14  Temp:      TempSrc:      SpO2:  93%  91%  Weight:      Height:        Intake/Output Summary (Last 24 hours) at 02/10/2019 1025 Last data filed at 02/10/2019 0800 Gross per 24 hour  Intake 487.75 ml  Output 1 ml  Net 486.75 ml    I/O since admission: +486  Filed Weights   02/09/19 1403  Weight: 81.6 kg    Telemetry    Sinus - Personally Reviewed  ECG    ECG (independently read by me): NSR at 66; QS anteroseptally with evolving T wave inversion V2-6, I,aVL  Physical Exam   BP (!) 113/95   Pulse 73   Temp 98.7 F (37.1 C) (Oral)   Resp 14   Ht 5\' 6"  (1.676 m)   Wt 81.6 kg   SpO2 91%   BMI 29.05 kg/m  General: Alert, oriented, no distress.  Skin: normal turgor, no rashes, warm and dry HEENT: Normocephalic, atraumatic. Pupils equal round and reactive to light; sclera anicteric; extraocular muscles intact;  Nose without nasal septal hypertrophy Mouth/Parynx benign; Mallinpatti scale 3 Neck: No JVD, no carotid bruits; normal carotid upstroke Lungs: clear to ausculatation and percussion; no  wheezing or rales Chest wall: without tenderness to palpitation Heart: PMI not displaced, RRR, s1 s2 normal, 1/6 systolic murmur, no diastolic murmur, no rubs, gallops, thrills, or heaves Abdomen: soft, nontender; no hepatosplenomehaly, BS+; abdominal aorta nontender and not dilated by palpation. Back: no CVA tenderness Pulses 2+ ecchymosis R radial site; pulse intact Musculoskeletal: full range of motion, normal strength, no joint deformities Extremities: no clubbing cyanosis or edema, Homan's sign negative  Neurologic: grossly nonfocal; Cranial nerves grossly wnl Psychologic: Normal mood and affect   Labs    Chemistry Recent Labs  Lab 02/09/19 1405 02/09/19 1852 02/10/19 0439  NA 133* 134* 140  K 3.8 3.9 3.8  CL 102 105 107  CO2 20* 21* 22  GLUCOSE 131* 145* 130*  BUN 21 14 11   CREATININE 0.80 0.77 0.76  CALCIUM 9.7 9.2 9.0  PROT  --  6.4*  --   ALBUMIN  --  3.8  --   AST  --  149*  --   ALT  --  40  --   ALKPHOS  --  70  --   BILITOT  --  0.7  --   GFRNONAA >60 >  60 >60  GFRAA >60 >60 >60  ANIONGAP 11 8 11      Hematology Recent Labs  Lab 02/09/19 1405 02/09/19 1852 02/10/19 0439  WBC 11.0* 10.5 7.8  RBC 5.74* 5.14* 5.16*  HGB 15.2* 13.6 13.6  HCT 47.9* 41.5 42.6  MCV 83.4 80.7 82.6  MCH 26.5 26.5 26.4  MCHC 31.7 32.8 31.9  RDW 14.0 13.6 14.0  PLT 219 200 192    Cardiac Enzymes Recent Labs  Lab 02/09/19 1852 02/09/19 2307 02/10/19 0439  TROPONINI 57.07* 57.03* 28.23*    Recent Labs  Lab 02/09/19 1417  TROPIPOC 0.24*     BNPNo results for input(s): BNP, PROBNP in the last 168 hours.   DDimer No results for input(s): DDIMER in the last 168 hours.   Lipid Panel     Component Value Date/Time   CHOL 194 02/10/2019 0439   TRIG 156 (H) 02/10/2019 0439   HDL 37 (L) 02/10/2019 0439   CHOLHDL 5.2 02/10/2019 0439   VLDL 31 02/10/2019 0439   LDLCALC 126 (H) 02/10/2019 0439     Radiology    Dg Chest Port 1 View  Result Date:  02/09/2019 CLINICAL DATA:  Sharp chest pain centered about left breast area. Nausea. EXAM: PORTABLE CHEST 1 VIEW COMPARISON:  None. FINDINGS: Midline trachea. Normal heart size. Atherosclerosis in the transverse aorta. No pleural effusion or pneumothorax. Pulmonary interstitial prominence is felt to be technique related; due to low lung volumes and AP portable technique. Numerous leads and wires project over the chest. No lobar consolidation. No overt congestive failure. IMPRESSION: No acute cardiopulmonary disease. Aortic Atherosclerosis (ICD10-I70.0). Electronically Signed   By: Jeronimo GreavesKyle  Talbot M.D.   On: 02/09/2019 14:38    Cardiac Studies    Prox RCA to Mid RCA lesion is 50% stenosed.  Ost Cx to Prox Cx lesion is 30% stenosed.  Mid LAD to Dist LAD lesion is 80% stenosed.  Ost LAD to Prox LAD lesion is 100% stenosed.  Post intervention, there is a 0% residual stenosis.  A drug-eluting stent was successfully placed using a STENT RESOLUTE ONYX 3.0X30.  There is severe left ventricular systolic dysfunction.  LV end diastolic pressure is normal.  The left ventricular ejection fraction is 25-35% by visual estimate.  There is trivial (1+) mitral regurgitation.   1. 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 Diffuse 50% proximal to mid RCA 3. Severe LV dysfunction with anterolateral akinesis. EF 30-35%. 4. Normal LVEDP 5. Successful PCI with stenting of the proximal LAD with DES. There is persistent diffuse narrowing of the mid to distal LAD of unclear etiology ? Spasm  Plan: DAPT for one year. Aggrastat for 6 hours. Optimize medical therapy with statin, beta blocker, ARB. If EF <35% by Echo consider Entresto. May also need to consider Lifevest at DC.     Intervention     Patient Profile     73 y.o. female who presented 02/09/19 in transfer for [facility] with Anterior STEMI and underwent emergent cath/PCI.  Assessment & Plan    1. Day 1 Anterior STEMI 2/2/ proximal LAD  occlusion Rx with DES stent to LAD with distal concomitant LAD stenosis of 80% and mild LCx and RCA disease. Troponin 57.7. Continue DAPT for a minimum of 12 months.  2. Ischemic cardiomyopathy: echo just completed at bedside; initial very preliminary review by me shows mid distal septal and apical hypokinesis c/w Anterior STEMI.  Est EF ~ 35 - 40%; await complete review. Now on coreg and  losartan post MI.  3. HLD; LDL 126; will add atovastain.   4. Cardiac Rehab.  Will keep in unit today and transfer tomorrow to telemetry.  Signed, Lennette Bihari, MD, [facility] 02/10/2019, 10:25 AM

## 2019-02-11 ENCOUNTER — Encounter (HOSPITAL_COMMUNITY): Payer: Self-pay | Admitting: Cardiology

## 2019-02-11 DIAGNOSIS — E785 Hyperlipidemia, unspecified: Secondary | ICD-10-CM | POA: Diagnosis present

## 2019-02-11 DIAGNOSIS — I5021 Acute systolic (congestive) heart failure: Secondary | ICD-10-CM

## 2019-02-11 MED ORDER — CARVEDILOL 6.25 MG PO TABS: 6.2500 mg | ORAL_TABLET | Freq: Two times a day (BID) | ORAL | 1 refills | Status: DC

## 2019-02-11 MED ORDER — ASPIRIN EC 81 MG PO TBEC: 81.0000 mg | DELAYED_RELEASE_TABLET | Freq: Every day | ORAL | Status: AC

## 2019-02-11 MED ORDER — ASPIRIN 81 MG PO CHEW: 81.0000 mg | CHEWABLE_TABLET | Freq: Every day | ORAL | Status: DC

## 2019-02-11 MED ORDER — LOSARTAN POTASSIUM 25 MG PO TABS: 25.0000 mg | ORAL_TABLET | Freq: Every day | ORAL | 1 refills | Status: DC

## 2019-02-11 MED ORDER — TICAGRELOR 90 MG PO TABS: 90.0000 mg | ORAL_TABLET | Freq: Two times a day (BID) | ORAL | 2 refills | Status: DC

## 2019-02-11 MED ORDER — ATORVASTATIN CALCIUM 80 MG PO TABS: 80.0000 mg | ORAL_TABLET | Freq: Every day | ORAL | 1 refills | Status: DC

## 2019-02-11 MED ORDER — NITROGLYCERIN 0.4 MG SL SUBL: 0.4000 mg | SUBLINGUAL_TABLET | SUBLINGUAL | 1 refills | Status: DC | PRN

## 2019-02-11 NOTE — Progress Notes (Signed)
CARDIAC REHAB PHASE I   PRE:  Rate/Rhythm: 65 SR  BP:  Supine:   Sitting: 90/79  Standing:    SaO2: 91%RA  MODE:  Ambulation: 370 ft   POST:  Rate/Rhythm: 74 SR  BP:  Supine:   Sitting: 116/99  Standing:    SaO2: would not register. Checked both hands [phone] Pt up in room packing. Pt walked 370 ft on RA with steady gait. No CP. No SOB noted. Gave pt CHF booklet and discussed when to call MD. Pt already knew to weigh daily and to watch sodium. Reinforced brilinta with stent. No questions re ed done yesterday. Already has been referred to [facility] CRP 2.   Luetta Nutting, RN BSN  02/11/2019 11:07 AM

## 2019-02-11 NOTE — Discharge Summary (Addendum)
Discharge Summary    Patient ID: Rachel Kennedy,  MRN: [id number], DOB/AGE: August 19, 1946 73 y.o.  Admit date: 02/09/2019 Discharge date: 02/11/2019  Primary Care Provider: Assunta Found Primary Cardiologist: Dr. Swaziland (may like to transfer to [facility] office)  Discharge Diagnoses    Principal Problem:   Acute ST elevation myocardial infarction (STEMI) of anterior wall [facility]) Active Problems:   Acute ST elevation myocardial infarction (STEMI) involving left anterior descending (LAD) coronary artery (HCC)   Hyperlipidemia   Acute systolic heart failure (HCC)  Allergies No Known Allergies  Diagnostic Studies/Procedures    Cath: 02/09/2019   Prox RCA to Mid RCA lesion is 50% stenosed.  Ost Cx to Prox Cx lesion is 30% stenosed.  Mid LAD to Dist LAD lesion is 80% stenosed.  Ost LAD to Prox LAD lesion is 100% stenosed.  Post intervention, there is a 0% residual stenosis.  A drug-eluting stent was successfully placed using a STENT RESOLUTE ONYX 3.0X30.  There is severe left ventricular systolic dysfunction.  LV end diastolic pressure is normal.  The left ventricular ejection fraction is 25-35% by visual estimate.  There is trivial (1+) mitral regurgitation.   1. 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 Diffuse 50% proximal to mid RCA 3. Severe LV dysfunction with anterolateral akinesis. EF 30-35%. 4. Normal LVEDP 5. Successful PCI with stenting of the proximal LAD with DES. There is persistent diffuse narrowing of the mid to distal LAD of unclear etiology ? Spasm  Plan: DAPT for one year. Aggrastat for 6 hours. Optimize medical therapy with statin, beta blocker, ARB. If EF <35% by Echo consider Entresto. May also need to consider Lifevest at DC.   TTE: 02/10/2019  IMPRESSIONS    1. The left ventricle has moderately reduced systolic function, with an ejection fraction of 35-40%. The cavity size was normal. Left ventricular diastolic Doppler  parameters are consistent with impaired relaxation.  2. Severe hypokinesis of the left ventricular mid anteroseptum and inferoseptum, anteroapex and apical septum.  3. Moderate hypokinesis of the left ventricular basal anteroseptum and basal inferoseptum.  4. The tricuspid valve is normal in structure.  5. The pulmonic valve was normal in structure.  6. The mitral valve is normal in structure.  7. The aortic valve is normal in structure. Mild sclerosis of the aortic valve.  _____________   History of Present Illness     73 yo female with PMH of HTN. She presented to the [facility] with onset of chest pain that started about 3 hours prior to presenting to the ED. Had associated nausea and diarrhea. Stated that she had similar pain about 2 weeks prior but did not see anyone for this. In the ED her EKG was concerning for anterior ST elevation and CODE STEMI was called. Pt was given ASA and heparin prior to transfer. Initial troponin was 0.24. CXR was negative. She was taken directly to the cath lab on arrival.   Hospital Course    Underwent cardiac cath noted above with proximal LAD occlusion Rx with DES stent to LAD with distal concomitant LAD stenosis of 80% and mild LCx and RCA disease. Troponin 57.7. Placed on DAPT with ASA/Brilinta for at least one year. Echo with EF noted at 35-40%. Placed on coreg and losartan. Blood pressures tolerated, and Entresto considered but too soft at the time of discharge to transition. LDL 126, placed on high dose statin. Worked well with cardiac rehab and referred to cardiac rehab at [facility]. Will  send message for follow up at [facility] office but may want to transfer after TOC follow up to [place].   Rachel Kennedy was seen by Dr. Gala Romney and determined stable for discharge home. Follow up in the office has been arranged. Medications are listed below.   _____________  Discharge Vitals Blood pressure 90/79, pulse 73, temperature 98.7 F (37.1 C), temperature  source Oral, resp. rate 13, height  (1.676 m), weight 81.6 kg, SpO2 100 %.  Filed Weights   02/09/19 1403  Weight: 81.6 kg    Labs & Radiologic Studies    CBC Recent Labs    02/09/19 1852 02/10/19 0439  WBC 10.5 7.8  HGB 13.6 13.6  HCT 41.5 42.6  MCV 80.7 82.6  PLT 200 192   Basic Metabolic Panel Recent Labs    40/98/11 1852 02/10/19 0439  NA 134* 140  K 3.9 3.8  CL 105 107  CO2 21* 22  GLUCOSE 145* 130*  BUN 14 11  CREATININE 0.77 0.76  CALCIUM 9.2 9.0   Liver Function Tests Recent Labs    02/09/19 1852  AST 149*  ALT 40  ALKPHOS 70  BILITOT 0.7  PROT 6.4*  ALBUMIN 3.8   No results for input(s): LIPASE, AMYLASE in the last 72 hours. Cardiac Enzymes Recent Labs    02/09/19 1852 02/09/19 2307 02/10/19 0439  TROPONINI 57.07* 57.03* 28.23*   BNP Invalid input(s): POCBNP D-Dimer No results for input(s): DDIMER in the last 72 hours. Hemoglobin A1C Recent Labs    02/09/19 1852  HGBA1C 5.8*   Fasting Lipid Panel Recent Labs    02/10/19 0439  CHOL 194  HDL 37*  LDLCALC 126*  TRIG 156*  CHOLHDL 5.2   Thyroid Function Tests Recent Labs    02/09/19 1852  TSH 0.648   _____________  Dg Chest Port 1 View  Result Date: 02/09/2019 CLINICAL DATA:  Sharp chest pain centered about left breast area. Nausea. EXAM: PORTABLE CHEST 1 VIEW COMPARISON:  None. FINDINGS: Midline trachea. Normal heart size. Atherosclerosis in the transverse aorta. No pleural effusion or pneumothorax. Pulmonary interstitial prominence is felt to be technique related; due to low lung volumes and AP portable technique. Numerous leads and wires project over the chest. No lobar consolidation. No overt congestive failure. IMPRESSION: No acute cardiopulmonary disease. Aortic Atherosclerosis (ICD10-I70.0). Electronically Signed   By: Jeronimo Greaves M.D.   On: 02/09/2019 14:38   Disposition   Pt is being discharged home today in good condition.  Follow-up Plans & Appointments     Follow-up Information    Swaziland, Peter M, MD Follow up.   Specialty:  Cardiology Why:  The office will call you with a follow up appt next week.  Contact information: [address] [place] [address] [phone]          Discharge Instructions    (HEART FAILURE PATIENTS) Call MD:  Anytime you have any of the following symptoms: 1) 3 pound weight gain in 24 hours or 5 pounds in 1 week 2) shortness of breath, with or without a dry hacking cough 3) swelling in the hands, feet or stomach 4) if you have to sleep on extra pillows at night in order to breathe.   Complete by:  As directed    Amb Referral to Cardiac Rehabilitation   Complete by:  As directed    Diagnosis:   Coronary Stents PTCA STEMI     Call MD for:  redness, tenderness, or signs of  infection (pain, swelling, redness, odor or green/yellow discharge around incision site)   Complete by:  As directed    Diet - low sodium heart healthy   Complete by:  As directed    Discharge instructions   Complete by:  As directed    Radial Site Care Refer to this sheet in the next few weeks. These instructions provide you with information on caring for yourself after your procedure. Your caregiver may also give you more specific instructions. Your treatment has been planned according to current medical practices, but problems sometimes occur. Call your caregiver if you have any problems or questions after your procedure. HOME CARE INSTRUCTIONS You may shower the day after the procedure.Remove the bandage (dressing) and gently wash the site with plain soap and water.Gently pat the site dry.  Do not apply powder or lotion to the site.  Do not submerge the affected site in water for 3 to 5 days.  Inspect the site at least twice daily.  Do not flex or bend the affected arm for 24 hours.  No lifting over 5 pounds (2.3 kg) for 5 days after your procedure.  Do not drive home if you are discharged the same day of the  procedure. Have someone else drive you.  You may drive 24 hours after the procedure unless otherwise instructed by your caregiver.  What to expect: Any bruising will usually fade within 1 to 2 weeks.  Blood that collects in the tissue (hematoma) may be painful to the touch. It should usually decrease in size and tenderness within 1 to 2 weeks.  SEEK IMMEDIATE MEDICAL CARE IF: You have unusual pain at the radial site.  You have redness, warmth, swelling, or pain at the radial site.  You have drainage (other than a small amount of blood on the dressing).  You have chills.  You have a fever or persistent symptoms for more than 72 hours.  You have a fever and your symptoms suddenly get worse.  Your arm becomes pale, cool, tingly, or numb.  You have heavy bleeding from the site. Hold pressure on the site.   PLEASE DO NOT MISS ANY DOSES OF YOUR BRILINTA!!!!! Also keep a log of you blood pressures and bring back to your follow up appt. Please call the office with any questions.   Patients taking blood thinners should generally stay away from medicines like ibuprofen, Advil, Motrin, naproxen, and Aleve due to risk of stomach bleeding. You may take Tylenol as directed or talk to your primary doctor about alternatives.  Please stop the Celebrex as this increases your bleeding risk while on aspirin and brilinta.   Increase activity slowly   Complete by:  As directed        Discharge Medications     Medication List    STOP taking these medications   celecoxib 200 MG capsule Commonly known as:  CELEBREX   Tavaborole 5 % Soln Commonly known as:  KERYDIN     TAKE these medications   acetaminophen 500 MG tablet Commonly known as:  TYLENOL Take 500 mg by mouth every 6 (six) hours as needed for headache (pain).   aspirin EC 81 MG tablet Take 1 tablet (81 mg total) by mouth daily.   atorvastatin 80 MG tablet Commonly known as:  LIPITOR Take 1 tablet (80 mg total) by mouth daily at 6  PM.   buPROPion 300 MG 24 hr tablet Commonly known as:  WELLBUTRIN XL Take 300 mg by mouth daily.  carvedilol 6.25 MG tablet Commonly known as:  COREG Take 1 tablet (6.25 mg total) by mouth 2 (two) times daily with a meal.   cycloSPORINE 0.05 % ophthalmic emulsion Commonly known as:  RESTASIS Place 1 drop into both eyes 2 (two) times daily.   doxycycline 20 MG tablet Commonly known as:  PERIOSTAT Take 20 mg by mouth 2 (two) times daily.   GLUCOSAMINE-CHONDROITIN PO Take 1 tablet by mouth 2 (two) times daily.   losartan 25 MG tablet Commonly known as:  COZAAR Take 1 tablet (25 mg total) by mouth daily. Start taking on:  February 12, 2019   Melatonin 10 MG Tabs Take 10 mg by mouth at bedtime.   nitroGLYCERIN 0.4 MG SL tablet Commonly known as:  NITROSTAT Place 1 tablet (0.4 mg total) under the tongue every 5 (five) minutes x 3 doses as needed for chest pain.   PRESCRIPTION MEDICATION Apply 1 application topically daily as needed (arthritis pain). Arthritis cream compounded at [facility]   Diclofenac 3% Baclofen 2% gabapentin 5% Lidocaine 5% Menthol 1%   sertraline 100 MG tablet Commonly known as:  ZOLOFT Take 100 mg by mouth at bedtime.   ticagrelor 90 MG Tabs tablet Commonly known as:  BRILINTA Take 1 tablet (90 mg total) by mouth 2 (two) times daily.   zolpidem 10 MG tablet Commonly known as:  AMBIEN Take 10 mg by mouth at bedtime as needed for sleep.       Acute coronary syndrome (MI, NSTEMI, STEMI, etc) this admission?: Yes.     AHA/ACC Clinical Performance & Quality Measures: 1. Aspirin prescribed? - Yes 2. ADP Receptor Inhibitor (Plavix/Clopidogrel, Brilinta/Ticagrelor or Effient/Prasugrel) prescribed (includes medically managed patients)? - Yes 3. Beta Blocker prescribed? - Yes 4. High Intensity Statin (Lipitor 40-80mg  or Crestor 20-40mg ) prescribed? - Yes 5. EF assessed during THIS hospitalization? - Yes 6. For EF <40%, was ACEI/ARB  prescribed? - Yes 7. For EF <40%, Aldosterone Antagonist (Spironolactone or Eplerenone) prescribed? - No - Reason:  blood pressure soft.  8. Cardiac Rehab Phase II ordered (Included Medically managed Patients)? - Yes  Outstanding Labs/Studies   BMET at follow up. LFTs/FLP in 6 weeks if tolerating statin. Consider transition to [facility] as outpatient if blood pressure stable.   Duration of Discharge Encounter   Greater than 30 minutes including physician time.  Signed, Laverda Page NP-C 02/11/2019, 10:59 AM   Agree with above. Stable post-MI. Ok for d/c. Refer to CR at [facility].  Arvilla Meres, MD  11:53 AM

## 2019-02-11 NOTE — Progress Notes (Signed)
Progress Note  Patient Name: Rachel Kennedy Date of Encounter: 02/11/2019  Primary Cardiologist: new; Dr. Swaziland  Subjective   Feels good. No CP. Ambulating room. Eager to go home.   Inpatient Medications    Scheduled Meds: . aspirin  81 mg Oral Daily  . atorvastatin  80 mg Oral q1800  . buPROPion  300 mg Oral Daily  . carvedilol  6.25 mg Oral BID WC  . enoxaparin (LOVENOX) injection  40 mg Subcutaneous Q24H  . losartan  25 mg Oral Daily  . sertraline  100 mg Oral Daily  . sodium chloride flush  3 mL Intravenous Once  . sodium chloride flush  3 mL Intravenous Q12H  . ticagrelor  90 mg Oral BID   Continuous Infusions: . sodium chloride     PRN Meds: sodium chloride, acetaminophen, nitroGLYCERIN, ondansetron (ZOFRAN) IV, sodium chloride flush   Vital Signs    Vitals:   02/11/19 0000 02/11/19 0300 02/11/19 0812 02/11/19 0835  BP:    98/74  Pulse:      Resp:    17  Temp: 97.6 F (36.4 C) (!) 97.5 F (36.4 C) 98.7 F (37.1 C)   TempSrc: Oral Oral Oral   SpO2:    100%  Weight:      Height:        Intake/Output Summary (Last 24 hours) at 02/11/2019 1019 Last data filed at 02/11/2019 0835 Gross per 24 hour  Intake 480 ml  Output -  Net 480 ml    I/O since admission: +486  Filed Weights   02/09/19 1403  Weight: 81.6 kg    Telemetry    Sinus 60-70 - Personally Reviewed  ECG    ECG (independently read by me): NSR at 66; QS anteroseptally with evolving T wave inversion V2-6, I,aVL  Physical Exam   BP 98/74 (BP Location: Left Arm)   Pulse 73   Temp 98.7 F (37.1 C) (Oral)   Resp 17   Ht 5\' 6"  (1.676 m)   Wt 81.6 kg   SpO2 100%   BMI 29.05 kg/m  General:  Well appearing. No resp difficulty HEENT: normal Neck: supple. no JVD. Carotids 2+ bilat; no bruits. No lymphadenopathy or thryomegaly appreciated. Cor: PMI nondisplaced. Regular rate & rhythm. No rubs, gallops or murmurs. Lungs: clear Abdomen: soft, nontender, nondistended. No  hepatosplenomegaly. No bruits or masses. Good bowel sounds. Extremities: no cyanosis, clubbing, rash, edema  R radial ecchymosis  +bruit Neuro: alert & orientedx3, cranial nerves grossly intact. moves all 4 extremities w/o difficulty. Affect pleasant   Labs    Chemistry Recent Labs  Lab 02/09/19 1405 02/09/19 1852 02/10/19 0439  NA 133* 134* 140  K 3.8 3.9 3.8  CL 102 105 107  CO2 20* 21* 22  GLUCOSE 131* 145* 130*  BUN 21 14 11   CREATININE 0.80 0.77 0.76  CALCIUM 9.7 9.2 9.0  PROT  --  6.4*  --   ALBUMIN  --  3.8  --   AST  --  149*  --   ALT  --  40  --   ALKPHOS  --  70  --   BILITOT  --  0.7  --   GFRNONAA >60 >60 >60  GFRAA >60 >60 >60  ANIONGAP 11 8 11      Hematology Recent Labs  Lab 02/09/19 1405 02/09/19 1852 02/10/19 0439  WBC 11.0* 10.5 7.8  RBC 5.74* 5.14* 5.16*  HGB 15.2* 13.6 13.6  HCT 47.9* 41.5 42.6  MCV 83.4 80.7 82.6  MCH 26.5 26.5 26.4  MCHC 31.7 32.8 31.9  RDW 14.0 13.6 14.0  PLT 219 200 192    Cardiac Enzymes Recent Labs  Lab 02/09/19 1852 02/09/19 2307 02/10/19 0439  TROPONINI 57.07* 57.03* 28.23*    Recent Labs  Lab 02/09/19 1417  TROPIPOC 0.24*     BNPNo results for input(s): BNP, PROBNP in the last 168 hours.   DDimer No results for input(s): DDIMER in the last 168 hours.   Lipid Panel     Component Value Date/Time   CHOL 194 02/10/2019 0439   TRIG 156 (H) 02/10/2019 0439   HDL 37 (L) 02/10/2019 0439   CHOLHDL 5.2 02/10/2019 0439   VLDL 31 02/10/2019 0439   LDLCALC 126 (H) 02/10/2019 0439     Radiology    Dg Chest Port 1 View  Result Date: 02/09/2019 CLINICAL DATA:  Sharp chest pain centered about left breast area. Nausea. EXAM: PORTABLE CHEST 1 VIEW COMPARISON:  None. FINDINGS: Midline trachea. Normal heart size. Atherosclerosis in the transverse aorta. No pleural effusion or pneumothorax. Pulmonary interstitial prominence is felt to be technique related; due to low lung volumes and AP portable technique.  Numerous leads and wires project over the chest. No lobar consolidation. No overt congestive failure. IMPRESSION: No acute cardiopulmonary disease. Aortic Atherosclerosis (ICD10-I70.0). Electronically Signed   By: Jeronimo GreavesKyle  Talbot M.D.   On: 02/09/2019 14:38    Cardiac Studies    Prox RCA to Mid RCA lesion is 50% stenosed.  Ost Cx to Prox Cx lesion is 30% stenosed.  Mid LAD to Dist LAD lesion is 80% stenosed.  Ost LAD to Prox LAD lesion is 100% stenosed.  Post intervention, there is a 0% residual stenosis.  A drug-eluting stent was successfully placed using a STENT RESOLUTE ONYX 3.0X30.  There is severe left ventricular systolic dysfunction.  LV end diastolic pressure is normal.  The left ventricular ejection fraction is 25-35% by visual estimate.  There is trivial (1+) mitral regurgitation.   1. 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 Diffuse 50% proximal to mid RCA 3. Severe LV dysfunction with anterolateral akinesis. EF 30-35%. 4. Normal LVEDP 5. Successful PCI with stenting of the proximal LAD with DES. There is persistent diffuse narrowing of the mid to distal LAD of unclear etiology ? Spasm  Plan: DAPT for one year. Aggrastat for 6 hours. Optimize medical therapy with statin, beta blocker, ARB. If EF <35% by Echo consider Entresto. May also need to consider Lifevest at DC.     Intervention     Patient Profile     73 y.o. female who presented 02/09/19 in transfer for [facility] with Anterior STEMI and underwent emergent cath/PCI.  Assessment & Plan    1. Anterior STEMI 2/2/ proximal LAD occlusion Rx with DES stent to LAD with distal concomitant LAD stenosis of 80% and mild LCx and RCA disease. Troponin 57.7. Continue DAPT for a minimum of 12 months. - pain free.  - Now on atorva, coreg and losartan post MI. BP too low for entresto currently - [facility]  2. Ischemic cardiomyopathy:  - Echo 2/21 EF ~ 35 - 40%;   3. HLD; LDL 126;  atorvastain added.    4. Cardiac Rehab.   Doing well ~48 hours post-STEM. Ok for d/c today.   D/c meds  Carvedilol 6.25 bid Losartan 25 qhs ECASA 81 Brilinta 90 bid  Sertraline  [facility]   Arvilla Meresaniel ,  MD  10:23 AM

## 2019-02-27 ENCOUNTER — Encounter: Payer: Self-pay | Admitting: Physician Assistant

## 2019-02-27 ENCOUNTER — Ambulatory Visit: Payer: Medicare Other | Admitting: Physician Assistant

## 2019-02-27 VITALS — BP 102/58 | HR 58 | Ht 66.0 in | Wt 180.0 lb

## 2019-02-27 DIAGNOSIS — I2510 Atherosclerotic heart disease of native coronary artery without angina pectoris: Secondary | ICD-10-CM | POA: Diagnosis not present

## 2019-02-27 DIAGNOSIS — Z79899 Other long term (current) drug therapy: Secondary | ICD-10-CM

## 2019-02-27 DIAGNOSIS — E785 Hyperlipidemia, unspecified: Secondary | ICD-10-CM

## 2019-02-27 DIAGNOSIS — I255 Ischemic cardiomyopathy: Secondary | ICD-10-CM

## 2019-02-27 DIAGNOSIS — I10 Essential (primary) hypertension: Secondary | ICD-10-CM

## 2019-02-27 LAB — BASIC METABOLIC PANEL
BUN/Creatinine Ratio: 20 (ref 12–28)
BUN: 16 mg/dL (ref 8–27)
CO2: 20 mmol/L (ref 20–29)
Calcium: 10 mg/dL (ref 8.7–10.3)
Chloride: 103 mmol/L (ref 96–106)
Creatinine, Ser: 0.79 mg/dL (ref 0.57–1.00)
GFR calc Af Amer: 87 mL/min/{1.73_m2} (ref >59)
GFR calc non Af Amer: 76 mL/min/{1.73_m2} (ref >59)
Glucose: 90 mg/dL (ref 65–99)
Potassium: 4.8 mmol/L (ref 3.5–5.2)
Sodium: 139 mmol/L (ref 134–144)

## 2019-02-27 NOTE — Patient Instructions (Addendum)
Medication Instructions: REMEMBER TO TAKE YOUR BRILINTA 2 TIMES A DAY Your physician recommends that you continue on your current medications as directed. Please refer to the Current Medication list given to you today.   If you need a refill on your cardiac medications before your next appointment, please call your pharmacy.   Lab work:TODAY you will have labs (blood work) drawn: BMET You will need to return in 6 weeks for more labs (blood work) drawn: LIPID and LIVER   If you have labs (blood work) drawn today and your tests are completely normal, you will receive your results only by: Marland Kitchen MyChart Message (if you have MyChart) OR . A paper copy in the mail If you have any lab test that is abnormal or we need to change your treatment, we will call you to review the results.  Testing/Procedures:  Echocardiogram should be scheduled for 3 months   Your physician has requested that you have an Echocardiogram. Echocardiography is a painless test that uses sound waves to create images of your heart. It provides your doctor with information about the size and shape of your heart and how well your heart's chambers and valves are working. This procedure takes approximately one hour. There are no restrictions for this procedure.    Follow-Up: . Your physician recommends that you schedule a follow-up appointment with Dr. Swaziland after your ECHO   Any Other Special Instructions Will Be Listed Below (If Applicable).

## 2019-02-27 NOTE — Progress Notes (Signed)
Cardiology Office Note    Date:  02/28/2019   ID:  Rachel Kennedy, Rachel Kennedy 11-22-1946, MRN [id number]  PCP:  Assunta Found, MD  Cardiologist: Dr. Swaziland  Chief Complaint  Patient presents with  . Follow-up    hospital follow up, new medication causing Diarrhea    History of Present Illness:  Rachel Kennedy is a 73 y.o. female with PMH of HTN, HLD, and recently diagnosed of CAD.  Patient recently presented to the hospital on 02/09/2019 with code STEMI.  Initial EKG was consistent with anterior STEMI.  She was transferred from an apparent hospital to [facility].  Initial troponin was 0.24.  Patient underwent emergent cardiac catheterization on 02/09/2019 which revealed 50% proximal to mid RCA lesion, 50% ostial to proximal left circumflex lesion, 80% mid to distal LAD lesion, 100% ostial to proximal LAD occlusion treated with Onyx resolute 3.0 x 30 mm DES, EF 25 to 35%, trivial 1+ MR.  Her troponin peaked at 57.07 before trending back down.  Echocardiogram obtained on the following day showed EF 35 to 40%, moderate hypokinesis of the LV basal anteroseptal and basal inferoseptal region, mild sclerosis of the aortic valve.  Lipid panel obtained during the hospital showed LDL 126, HDL 37.  Patient was discharged on aspirin and Brilinta along with carvedilol and losartan.  Blood pressure in the hospital was too low for Entresto.  Patient presents today for cardiology office visit.  Her blood pressure is borderline.  She denies any significant chest discomfort or shortness of breath.  She apparently has been taking the Brilinta only once a day, we emphasized repeatedly today that Brilinta is twice a day.  She has been compliant with aspirin.  I do not think I can switch her losartan to the Entresto at this point given borderline blood pressure.  I will continue her on the current medication.  She will need basic metabolic panel to make sure her renal function and electrolyte is stable on the  losartan.  She will need fasting lipid panel and LFT in 6 to 8 weeks.  I plan for her to have a repeat echocardiogram in 3 months before her next office visit with Dr. Swaziland.  She does have some diarrhea with the new medication, however I recommended her to continue to observe and see her primary care provider.   Past Medical History:  Diagnosis Date  . Acute systolic (congestive) heart failure (HCC)   . Hyperlipidemia   . Hypertension   . STEMI (ST elevation myocardial infarction) [facility])     Past Surgical History:  Procedure Laterality Date  . CORONARY/GRAFT ACUTE MI REVASCULARIZATION N/A 02/09/2019   Procedure: CORONARY/GRAFT ACUTE MI REVASCULARIZATION;  Surgeon: Swaziland, Peter M, MD;  Location: [facility] INVASIVE CV LAB;  Service: Cardiovascular;  Laterality: N/A;  . FRACTURE SURGERY    . LEFT HEART CATH AND CORONARY ANGIOGRAPHY N/A 02/09/2019   Procedure: LEFT HEART CATH AND CORONARY ANGIOGRAPHY;  Surgeon: Swaziland, Peter M, MD;  Location: [facility] INVASIVE CV LAB;  Service: Cardiovascular;  Laterality: N/A;  . OVARY SURGERY      Current Medications: Outpatient Medications Prior to Visit  Medication Sig Dispense Refill  . acetaminophen (TYLENOL) 500 MG tablet Take 500 mg by mouth every 6 (six) hours as needed for headache (pain).    Marland Kitchen aspirin EC 81 MG tablet Take 1 tablet (81 mg total) by mouth daily.    Marland Kitchen atorvastatin (LIPITOR) 80 MG tablet Take 1 tablet (80 mg total) by mouth  daily at 6 PM. 30 tablet 1  . buPROPion (WELLBUTRIN XL) 300 MG 24 hr tablet Take 300 mg by mouth daily.  1  . carvedilol (COREG) 6.25 MG tablet Take 1 tablet (6.25 mg total) by mouth 2 (two) times daily with a meal. 60 tablet 1  . cycloSPORINE (RESTASIS) 0.05 % ophthalmic emulsion Place 1 drop into both eyes 2 (two) times daily.    Marland Kitchen doxycycline (PERIOSTAT) 20 MG tablet Take 20 mg by mouth 2 (two) times daily.  2  . losartan (COZAAR) 25 MG tablet Take 1 tablet (25 mg total) by mouth daily. 30 tablet 1  . Melatonin 10 MG TABS  Take 10 mg by mouth at bedtime.    . nitroGLYCERIN (NITROSTAT) 0.4 MG SL tablet Place 1 tablet (0.4 mg total) under the tongue every 5 (five) minutes x 3 doses as needed for chest pain. 25 tablet 1  . PRESCRIPTION MEDICATION Apply 1 application topically daily as needed (arthritis pain). Arthritis cream compounded at [facility]   Diclofenac 3% Baclofen 2% gabapentin 5% Lidocaine 5% Menthol 1%    . sertraline (ZOLOFT) 100 MG tablet Take 100 mg by mouth at bedtime.     . ticagrelor (BRILINTA) 90 MG TABS tablet Take 1 tablet (90 mg total) by mouth 2 (two) times daily. 180 tablet 2  . zolpidem (AMBIEN) 10 MG tablet Take 10 mg by mouth at bedtime as needed for sleep.     Marland Kitchen GLUCOSAMINE-CHONDROITIN PO Take 1 tablet by mouth 2 (two) times daily.     No facility-administered medications prior to visit.      Allergies:   Patient has no known allergies.   Social History   Socioeconomic History  . Marital status: Significant Other    Spouse name: Not on file  . Number of children: Not on file  . Years of education: Not on file  . Highest education level: Not on file  Occupational History  . Not on file  Social Needs  . Financial resource strain: Not on file  . Food insecurity:    Worry: Not on file    Inability: Not on file  . Transportation needs:    Medical: Not on file    Non-medical: Not on file  Tobacco Use  . Smoking status: Former Games developer  . Smokeless tobacco: Never Used  . Tobacco comment: quit in 2017. Smoked for 30 years prior to that.   Substance and Sexual Activity  . Alcohol use: No    Alcohol/week: 0.0 standard drinks  . Drug use: No  . Sexual activity: Not on file  Lifestyle  . Physical activity:    Days per week: Not on file    Minutes per session: Not on file  . Stress: Not on file  Relationships  . Social connections:    Talks on phone: Not on file    Gets together: Not on file    Attends religious service: Not on file    Active member of club or  organization: Not on file    Attends meetings of clubs or organizations: Not on file    Relationship status: Not on file  Other Topics Concern  . Not on file  Social History Narrative  . Not on file     Family History:  The patient's family history includes Heart disease in her father; Transient ischemic attack in her mother.   ROS:   Please see the history of present illness.    ROS All other systems  reviewed and are negative.   PHYSICAL EXAM:   VS:  BP (!) 102/58   Pulse (!) 58   Ht  (1.676 m)   Wt 180 lb (81.6 kg)   BMI 29.05 kg/m    GEN: Well nourished, well developed, in no acute distress  HEENT: normal  Neck: no JVD, carotid bruits, or masses Cardiac: RRR; no murmurs, rubs, or gallops,no edema  Respiratory:  clear to auscultation bilaterally, normal work of breathing GI: soft, nontender, nondistended, + BS MS: no deformity or atrophy  Skin: warm and dry, no rash Neuro:  Alert and Oriented x 3, Strength and sensation are intact Psych: euthymic mood, full affect  Wt Readings from Last 3 Encounters:  02/27/19 180 lb (81.6 kg)  02/09/19 180 lb (81.6 kg)      Studies/Labs Reviewed:   EKG:  EKG is ordered today.  The ekg ordered today demonstrates normal sinus rhythm with diffuse T wave inversion in anterior lateral leads  Recent Labs: 02/09/2019: ALT 40; TSH 0.648 02/10/2019: Hemoglobin 13.6; Platelets 192 02/27/2019: BUN 16; Creatinine, Ser 0.79; Potassium 4.8; Sodium 139   Lipid Panel    Component Value Date/Time   CHOL 194 02/10/2019 0439   TRIG 156 (H) 02/10/2019 0439   HDL 37 (L) 02/10/2019 0439   CHOLHDL 5.2 02/10/2019 0439   VLDL 31 02/10/2019 0439   LDLCALC 126 (H) 02/10/2019 0439    Additional studies/ records that were reviewed today include:   Cath 02/09/2019  Prox RCA to Mid RCA lesion is 50% stenosed.  Ost Cx to Prox Cx lesion is 30% stenosed.  Mid LAD to Dist LAD lesion is 80% stenosed.  Ost LAD to Prox LAD lesion is 100%  stenosed.  Post intervention, there is a 0% residual stenosis.  A drug-eluting stent was successfully placed using a STENT RESOLUTE ONYX 3.0X30.  There is severe left ventricular systolic dysfunction.  LV end diastolic pressure is normal.  The left ventricular ejection fraction is 25-35% by visual estimate.  There is trivial (1+) mitral regurgitation.   1. 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 Diffuse 50% proximal to mid RCA 3. Severe LV dysfunction with anterolateral akinesis. EF 30-35%. 4. Normal LVEDP 5. Successful PCI with stenting of the proximal LAD with DES. There is persistent diffuse narrowing of the mid to distal LAD of unclear etiology ? Spasm  Plan: DAPT for one year. Aggrastat for 6 hours. Optimize medical therapy with statin, beta blocker, ARB. If EF <35% by Echo consider Entresto. May also need to consider Lifevest at DC.    Echo 02/10/2019  1. The left ventricle has moderately reduced systolic function, with an ejection fraction of 35-40%. The cavity size was normal. Left ventricular diastolic Doppler parameters are consistent with impaired relaxation.  2. Severe hypokinesis of the left ventricular mid anteroseptum and inferoseptum, anteroapex and apical septum.  3. Moderate hypokinesis of the left ventricular basal anteroseptum and basal inferoseptum.  4. The tricuspid valve is normal in structure.  5. The pulmonic valve was normal in structure.  6. The mitral valve is normal in structure.  7. The aortic valve is normal in structure. Mild sclerosis of the aortic valve.  SUMMARY   Endocardial border not optimally visualized for wall motion abnormalities, future exams should be performed with Definity IV LV enhancement  ASSESSMENT:    1. Coronary artery disease involving native coronary artery of native heart without angina pectoris   2. Cardiomyopathy, ischemic   3. Hyperlipidemia, unspecified  hyperlipidemia type   4. Medication management    5. Essential hypertension      PLAN:  In order of problems listed above:  1. CAD: Recently underwent DES to ostial LAD.  Continue to have 80% mid to distal LAD lesion that was treated medically.  Denies any further chest discomfort.  Continue aspirin and Brilinta.  She has been taking Brilinta only once a day, I instructed her to start taking Brilinta twice a day.  2. Ischemic cardiomyopathy: Continue carvedilol and losartan.  Unable to add Entresto given soft blood pressure.  Plan for repeat echocardiogram in 3 months  3. Hypertension: Blood pressure borderline on current therapy.  Obtain basic metabolic panel today given losartan.  4. Hyperlipidemia: Need a fasting lipid panel and LFT in 6 to 8 weeks    Medication Adjustments/Labs and Tests Ordered: Current medicines are reviewed at length with the patient today.  Concerns regarding medicines are outlined above.  Medication changes, Labs and Tests ordered today are listed in the Patient Instructions below. Patient Instructions  Medication Instructions: REMEMBER TO TAKE YOUR BRILINTA 2 TIMES A DAY Your physician recommends that you continue on your current medications as directed. Please refer to the Current Medication list given to you today.   If you need a refill on your cardiac medications before your next appointment, please call your pharmacy.   Lab work:TODAY you will have labs (blood work) drawn: BMET You will need to return in 6 weeks for more labs (blood work) drawn: LIPID and LIVER   If you have labs (blood work) drawn today and your tests are completely normal, you will receive your results only by: Marland Kitchen MyChart Message (if you have MyChart) OR . A paper copy in the mail If you have any lab test that is abnormal or we need to change your treatment, we will call you to review the results.  Testing/Procedures:  Echocardiogram should be scheduled for 3 months   Your physician has requested that you have an  Echocardiogram. Echocardiography is a painless test that uses sound waves to create images of your heart. It provides your doctor with information about the size and shape of your heart and how well your heart's chambers and valves are working. This procedure takes approximately one hour. There are no restrictions for this procedure.    Follow-Up: . Your physician recommends that you schedule a follow-up appointment with Dr. Swaziland after your ECHO   Any Other Special Instructions Will Be Listed Below (If Applicable).       Ramond Dial, [place]  02/28/2019 9:05 AM    [facility] [address] [place], [place], [place]  [address] Phone: [phone]; Fax: [phone]

## 2019-02-28 ENCOUNTER — Encounter: Payer: Self-pay | Admitting: Physician Assistant

## 2019-03-02 NOTE — Progress Notes (Signed)
The patient has been notified of the result and verbalized understanding.  All questions (if any) were answered. Dorris Fetch, CMA 03/02/2019 3:33 PM

## 2019-03-05 ENCOUNTER — Other Ambulatory Visit (HOSPITAL_COMMUNITY): Payer: Self-pay | Admitting: Cardiology

## 2019-03-06 ENCOUNTER — Other Ambulatory Visit: Payer: Self-pay | Admitting: *Deleted

## 2019-03-06 MED ORDER — ATORVASTATIN CALCIUM 80 MG PO TABS: 80.0000 mg | ORAL_TABLET | Freq: Every day | ORAL | 2 refills | Status: DC

## 2019-03-06 MED ORDER — LOSARTAN POTASSIUM 25 MG PO TABS: 25.0000 mg | ORAL_TABLET | Freq: Every day | ORAL | 2 refills | Status: DC

## 2019-03-31 DIAGNOSIS — R0602 Shortness of breath: Secondary | ICD-10-CM | POA: Insufficient documentation

## 2019-04-05 ENCOUNTER — Encounter (HOSPITAL_COMMUNITY): Payer: Medicare Other

## 2019-04-10 ENCOUNTER — Other Ambulatory Visit (HOSPITAL_COMMUNITY)
Admission: RE | Admit: 2019-04-10 | Discharge: 2019-04-10 | Disposition: A | Discharge status: Home / Self Care | Payer: Medicare Other | Source: Ambulatory Visit | Attending: Physician Assistant | Admitting: Physician Assistant

## 2019-04-10 ENCOUNTER — Telehealth: Payer: Self-pay

## 2019-04-10 DIAGNOSIS — E785 Hyperlipidemia, unspecified: Secondary | ICD-10-CM | POA: Diagnosis present

## 2019-04-10 LAB — LIPID PANEL
Cholesterol: 133 mg/dL (ref 0–200)
HDL: 52 mg/dL (ref >40)
LDL Cholesterol: 61 mg/dL (ref 0–99)
Total CHOL/HDL Ratio: 2.6 RATIO
Triglycerides: 100 mg/dL (ref <150)
VLDL: 20 mg/dL (ref 0–40)

## 2019-04-10 LAB — HEPATIC FUNCTION PANEL
ALT: 22 U/L (ref 0–44)
AST: 19 U/L (ref 15–41)
Albumin: 4.3 g/dL (ref 3.5–5.0)
Alkaline Phosphatase: 89 U/L (ref 38–126)
Bilirubin, Direct: <0.1 mg/dL (ref 0.0–0.2)
Total Bilirubin: 0.5 mg/dL (ref 0.3–1.2)
Total Protein: 7.4 g/dL (ref 6.5–8.1)

## 2019-04-10 NOTE — Progress Notes (Signed)
Tried calling patient could not leave a message

## 2019-04-10 NOTE — Telephone Encounter (Signed)
Tried calling patient to give  Lab results could not leave a message

## 2019-04-28 ENCOUNTER — Telehealth: Payer: Self-pay

## 2019-04-28 NOTE — Progress Notes (Signed)
Will also mail a copy of the results to the patient with Azalee Course, PA-C comments

## 2019-04-28 NOTE — Progress Notes (Signed)
Left a voice message for the patient with her results and stated that she could call our office if she has any questions.

## 2019-04-28 NOTE — Telephone Encounter (Addendum)
Left a detailed message for the patient about her results and stated that if she has any questions about her results to give our office a call and someone will assist her.   ----- Message from Azalee Course, [place] sent at 04/10/2019  4:01 PM EDT ----- Cholesterol much better, bad cholesterol (LDL) now at goal. Liver function normal.

## 2019-05-07 ENCOUNTER — Observation Stay (HOSPITAL_BASED_OUTPATIENT_CLINIC_OR_DEPARTMENT_OTHER): Payer: Medicare Other

## 2019-05-07 ENCOUNTER — Emergency Department (HOSPITAL_COMMUNITY): Payer: Medicare Other

## 2019-05-07 ENCOUNTER — Encounter (HOSPITAL_COMMUNITY): Payer: Self-pay

## 2019-05-07 ENCOUNTER — Observation Stay (HOSPITAL_COMMUNITY): Payer: Medicare Other

## 2019-05-07 ENCOUNTER — Other Ambulatory Visit: Payer: Self-pay

## 2019-05-07 ENCOUNTER — Observation Stay (HOSPITAL_COMMUNITY)
Admission: EM | Admit: 2019-05-07 | Discharge: 2019-05-07 | Disposition: A | Discharge status: Home / Self Care | Payer: Medicare Other | Attending: Internal Medicine | Admitting: Internal Medicine

## 2019-05-07 DIAGNOSIS — I5022 Chronic systolic (congestive) heart failure: Secondary | ICD-10-CM

## 2019-05-07 DIAGNOSIS — Z7982 Long term (current) use of aspirin: Secondary | ICD-10-CM | POA: Diagnosis not present

## 2019-05-07 DIAGNOSIS — Z79899 Other long term (current) drug therapy: Secondary | ICD-10-CM | POA: Diagnosis not present

## 2019-05-07 DIAGNOSIS — F329 Major depressive disorder, single episode, unspecified: Secondary | ICD-10-CM | POA: Diagnosis not present

## 2019-05-07 DIAGNOSIS — I2510 Atherosclerotic heart disease of native coronary artery without angina pectoris: Secondary | ICD-10-CM | POA: Diagnosis present

## 2019-05-07 DIAGNOSIS — I110 Hypertensive heart disease with heart failure: Secondary | ICD-10-CM | POA: Insufficient documentation

## 2019-05-07 DIAGNOSIS — R55 Syncope and collapse: Principal | ICD-10-CM | POA: Diagnosis present

## 2019-05-07 DIAGNOSIS — R4182 Altered mental status, unspecified: Secondary | ICD-10-CM

## 2019-05-07 DIAGNOSIS — Z20828 Contact with and (suspected) exposure to other viral communicable diseases: Secondary | ICD-10-CM | POA: Diagnosis not present

## 2019-05-07 DIAGNOSIS — I10 Essential (primary) hypertension: Secondary | ICD-10-CM | POA: Diagnosis present

## 2019-05-07 DIAGNOSIS — I252 Old myocardial infarction: Secondary | ICD-10-CM | POA: Diagnosis not present

## 2019-05-07 DIAGNOSIS — F32A Depression, unspecified: Secondary | ICD-10-CM | POA: Diagnosis present

## 2019-05-07 DIAGNOSIS — I6523 Occlusion and stenosis of bilateral carotid arteries: Secondary | ICD-10-CM

## 2019-05-07 DIAGNOSIS — E785 Hyperlipidemia, unspecified: Secondary | ICD-10-CM | POA: Diagnosis present

## 2019-05-07 DIAGNOSIS — Z87891 Personal history of nicotine dependence: Secondary | ICD-10-CM | POA: Diagnosis not present

## 2019-05-07 HISTORY — DX: Depression, unspecified: F32.A

## 2019-05-07 HISTORY — DX: Atherosclerotic heart disease of native coronary artery without angina pectoris: I25.10

## 2019-05-07 LAB — TROPONIN I
Troponin I: <0.03 ng/mL (ref <0.03)
Troponin I: <0.03 ng/mL (ref <0.03)
Troponin I: <0.03 ng/mL (ref <0.03)

## 2019-05-07 LAB — BASIC METABOLIC PANEL
Anion gap: 8 (ref 5–15)
BUN: 16 mg/dL (ref 8–23)
CO2: 25 mmol/L (ref 22–32)
Calcium: 9.1 mg/dL (ref 8.9–10.3)
Chloride: 107 mmol/L (ref 98–111)
Creatinine, Ser: 0.74 mg/dL (ref 0.44–1.00)
GFR calc Af Amer: >60 mL/min (ref >60)
GFR calc non Af Amer: >60 mL/min (ref >60)
Glucose, Bld: 113 mg/dL — ABNORMAL HIGH (ref 70–99)
Potassium: 3.6 mmol/L (ref 3.5–5.1)
Sodium: 140 mmol/L (ref 135–145)

## 2019-05-07 LAB — CBC WITH DIFFERENTIAL/PLATELET
Abs Immature Granulocytes: 0.01 10*3/uL (ref 0.00–0.07)
Basophils Absolute: 0.1 10*3/uL (ref 0.0–0.1)
Basophils Relative: 1 %
Eosinophils Absolute: 0.1 10*3/uL (ref 0.0–0.5)
Eosinophils Relative: 1 %
HCT: 41.2 % (ref 36.0–46.0)
Hemoglobin: 13.2 g/dL (ref 12.0–15.0)
Immature Granulocytes: 0 %
Lymphocytes Relative: 34 %
Lymphs Abs: 1.9 10*3/uL (ref 0.7–4.0)
MCH: 27.2 pg (ref 26.0–34.0)
MCHC: 32 g/dL (ref 30.0–36.0)
MCV: 84.9 fL (ref 80.0–100.0)
Monocytes Absolute: 0.6 10*3/uL (ref 0.1–1.0)
Monocytes Relative: 10 %
Neutro Abs: 2.9 10*3/uL (ref 1.7–7.7)
Neutrophils Relative %: 54 %
Platelets: 176 10*3/uL (ref 150–400)
RBC: 4.85 MIL/uL (ref 3.87–5.11)
RDW: 13.7 % (ref 11.5–15.5)
WBC: 5.5 10*3/uL (ref 4.0–10.5)
nRBC: 0 % (ref 0.0–0.2)

## 2019-05-07 LAB — D-DIMER, QUANTITATIVE: D-Dimer, Quant: 0.31 ug/mL-FEU (ref 0.00–0.50)

## 2019-05-07 LAB — MAGNESIUM: Magnesium: 2.2 mg/dL (ref 1.7–2.4)

## 2019-05-07 LAB — SARS CORONAVIRUS 2 BY RT PCR (HOSPITAL ORDER, PERFORMED IN ~~LOC~~ HOSPITAL LAB): SARS Coronavirus 2: NEGATIVE

## 2019-05-07 LAB — PHOSPHORUS: Phosphorus: 3.8 mg/dL (ref 2.5–4.6)

## 2019-05-07 LAB — ECHOCARDIOGRAM COMPLETE
Height: 67 in
Weight: 2931.24 oz

## 2019-05-07 LAB — BRAIN NATRIURETIC PEPTIDE: B Natriuretic Peptide: 167 pg/mL — ABNORMAL HIGH (ref 0.0–100.0)

## 2019-05-07 MED ORDER — IOHEXOL 350 MG/ML SOLN
75.0000 mL | Freq: Once | INTRAVENOUS | Status: AC | PRN
  Administered 2019-05-07: 15:00:00 75 mL via INTRAVENOUS

## 2019-05-07 MED ORDER — MELATONIN 3 MG PO TABS: 9.0000 mg | ORAL_TABLET | Freq: Every day | ORAL | Status: DC

## 2019-05-07 MED ORDER — DOXYCYCLINE HYCLATE 20 MG PO TABS: 20.0000 mg | ORAL_TABLET | Freq: Two times a day (BID) | ORAL | Status: DC

## 2019-05-07 MED ORDER — ATORVASTATIN CALCIUM 40 MG PO TABS
80.0000 mg | ORAL_TABLET | Freq: Every day | ORAL | Status: DC
  Administered 2019-05-07: 80 mg via ORAL
  Filled 2019-05-07: qty 2

## 2019-05-07 MED ORDER — LOSARTAN POTASSIUM 50 MG PO TABS
25.0000 mg | ORAL_TABLET | Freq: Every day | ORAL | Status: DC
  Administered 2019-05-07: 25 mg via ORAL
  Filled 2019-05-07: qty 1

## 2019-05-07 MED ORDER — SERTRALINE HCL 50 MG PO TABS: 100.0000 mg | ORAL_TABLET | Freq: Every day | ORAL | Status: DC

## 2019-05-07 MED ORDER — ACETAMINOPHEN 325 MG PO TABS: 650.0000 mg | ORAL_TABLET | Freq: Four times a day (QID) | ORAL | Status: DC | PRN

## 2019-05-07 MED ORDER — ACETAMINOPHEN 650 MG RE SUPP: 650.0000 mg | Freq: Four times a day (QID) | RECTAL | Status: DC | PRN

## 2019-05-07 MED ORDER — CYCLOSPORINE 0.05 % OP EMUL
1.0000 [drp] | Freq: Two times a day (BID) | OPHTHALMIC | Status: DC
  Administered 2019-05-07: 1 [drp] via OPHTHALMIC
  Filled 2019-05-07: qty 30

## 2019-05-07 MED ORDER — TICAGRELOR 90 MG PO TABS
90.0000 mg | ORAL_TABLET | Freq: Two times a day (BID) | ORAL | Status: DC
  Administered 2019-05-07: 90 mg via ORAL
  Filled 2019-05-07: qty 1

## 2019-05-07 MED ORDER — NITROGLYCERIN 0.4 MG SL SUBL: 0.4000 mg | SUBLINGUAL_TABLET | SUBLINGUAL | Status: DC | PRN

## 2019-05-07 MED ORDER — ENOXAPARIN SODIUM 40 MG/0.4ML ~~LOC~~ SOLN
40.0000 mg | SUBCUTANEOUS | Status: DC
  Administered 2019-05-07: 40 mg via SUBCUTANEOUS
  Filled 2019-05-07: qty 0.4

## 2019-05-07 MED ORDER — BUPROPION HCL ER (XL) 300 MG PO TB24
300.0000 mg | ORAL_TABLET | Freq: Every day | ORAL | Status: DC
  Administered 2019-05-07: 11:00:00 300 mg via ORAL
  Filled 2019-05-07: qty 1

## 2019-05-07 NOTE — ED Notes (Signed)
Pt to ct 

## 2019-05-07 NOTE — H&P (Signed)
History and Physical    Rachel Kennedy ZOX:[id number] DOB: 01/28/1946 DOA: 05/07/2019  PCP: Assunta Found, MD   Patient coming from: Home.  I have personally briefly reviewed patient's old medical records in [facility]  Chief Complaint: Dizziness.  HPI: Rachel Kennedy is a 73 y.o. female with medical history significant of recent STEMI in February which produced acute systolic CHF, depression, hyperlipidemia, hypertension who is coming to the emergency department with complaints of feeling dizzy while she was in her bathroom at home getting ready for bed.  The patient states that she looked towards the floor and noticed that he is seemed " funny", her knees and legs became weak and she lowered herself to the floor.  The symptoms resolved shortly after that, but given her recent cardiovascular event she decided to come to the emergency department.  She denied any recent positional or post defecation lightheadedness.  No chest pain, palpitations, diaphoresis, PND, orthopnea or pitting edema of the lower extremities.  Denies headache, fever, chills, sore throat, rhinorrhea, wheezing, hemoptysis, abdominal pain, nausea or emesis, diarrhea or constipation, melena or hematochezia.  Denies dysuria frequency or hematuria.  Denies polyuria, polydipsia, polyphagia or blurred vision.  ED Course: Initial vital signs temperature 97.6 F, pulse 63, respirations 18, blood pressure 139/71 mmHg and O2 sat 96% on room air.  The patient did not receive any medications in the ED.  Her CBC was normal.  D-dimer within normal limits.  Troponin was negative.  BNP was 167.0 pg/mL.  Chemistry shows glucose of 113 mg/dL, all other values are within normal limits.  Imaging: A 2 view chest radiograph did not show any active cardiopulmonary pathology.  CT head without contrast was normal.  Review of Systems: As per HPI otherwise 10 point review of systems negative.   Past Medical History:  Diagnosis Date  . Acute  systolic (congestive) heart failure (HCC)   . CAD (coronary artery disease) 05/07/2019  . Depression 05/07/2019  . Hyperlipidemia   . Hypertension   . STEMI (ST elevation myocardial infarction) [facility])     Past Surgical History:  Procedure Laterality Date  . CORONARY/GRAFT ACUTE MI REVASCULARIZATION N/A 02/09/2019   Procedure: CORONARY/GRAFT ACUTE MI REVASCULARIZATION;  Surgeon: Swaziland, Peter M, MD;  Location: [facility] INVASIVE CV LAB;  Service: Cardiovascular;  Laterality: N/A;  . FRACTURE SURGERY    . LEFT HEART CATH AND CORONARY ANGIOGRAPHY N/A 02/09/2019   Procedure: LEFT HEART CATH AND CORONARY ANGIOGRAPHY;  Surgeon: Swaziland, Peter M, MD;  Location: [facility] INVASIVE CV LAB;  Service: Cardiovascular;  Laterality: N/A;  . OVARY SURGERY       reports that she has quit smoking. She has never used smokeless tobacco. She reports that she does not drink alcohol or use drugs.  No Known Allergies  Family History  Problem Relation Age of Onset  . Heart disease Father        CABG  . Transient ischemic attack Mother    Prior to Admission medications   Medication Sig Start Date End Date Taking? Authorizing Provider  acetaminophen (TYLENOL) 500 MG tablet Take 500 mg by mouth every 6 (six) hours as needed for headache (pain).    [provider]  aspirin EC 81 MG tablet Take 1 tablet (81 mg total) by mouth daily. 02/11/19 02/11/20  Arty Baumgartner, NP  atorvastatin (LIPITOR) 80 MG tablet Take 1 tablet (80 mg total) by mouth daily at 6 PM. 03/06/19   [place]il[place], Peter M, MD  buPROPion [facility]  XL) 300 MG 24 hr tablet Take 300 mg by mouth daily. 10/04/14   [provider]  carvedilol (COREG) 6.25 MG tablet TAKE 1 TABLET (6.25 MG TOTAL) BY MOUTH 2 (TWO) TIMES DAILY WITH A MEAL. 03/06/19   Azalee Course, PA  cycloSPORINE (RESTASIS) 0.05 % ophthalmic emulsion Place 1 drop into both eyes 2 (two) times daily.    [provider]  doxycycline (PERIOSTAT) 20 MG tablet Take 20 mg by mouth 2 (two)  times daily. 11/17/14   [provider]  losartan (COZAAR) 25 MG tablet Take 1 tablet (25 mg total) by mouth daily. 03/06/19   [place]il[place], Peter M, MD  Melatonin 10 MG TABS Take 10 mg by mouth at bedtime.    [provider]  nitroGLYCERIN (NITROSTAT) 0.4 MG SL tablet Place 1 tablet (0.4 mg total) under the tongue every 5 (five) minutes x 3 doses as needed for chest pain. 02/11/19   Arty Baumgartner, NP  PRESCRIPTION MEDICATION Apply 1 application topically daily as needed (arthritis pain). Arthritis cream compounded at [facility]   Diclofenac 3% Baclofen 2% gabapentin 5% Lidocaine 5% Menthol 1%    [provider]  sertraline (ZOLOFT) 100 MG tablet Take 100 mg by mouth at bedtime.     [provider]  ticagrelor (BRILINTA) 90 MG TABS tablet Take 1 tablet (90 mg total) by mouth 2 (two) times daily. 02/11/19   Arty Baumgartner, NP  zolpidem (AMBIEN) 10 MG tablet Take 10 mg by mouth at bedtime as needed for sleep.  01/22/19   [provider]    Physical Exam: Vitals:   05/07/19 0215 05/07/19 0230 05/07/19 0249 05/07/19 0250  BP: 139/71 135/73 (!) 140/54 127/68  Pulse: 63 (!) 56 65 (!) 59  Resp: 18 15 (!) 21 14  Temp: 97.6 F (36.4 C)     SpO2: 96% 97% 98% 95%  Weight: 81.6 kg     Height: 5\' 7"  (1.702 m)       Constitutional: NAD, calm, comfortable Eyes: PERRL, lids and conjunctivae normal ENMT: Mucous membranes are moist. Posterior pharynx clear of any exudate or lesions. Neck: normal, supple, no masses, no thyromegaly Respiratory: clear to auscultation bilaterally, no wheezing, no crackles. Normal respiratory effort. No accessory muscle use.  Cardiovascular: Bradycardic at 58 bpm, no murmurs / rubs / gallops. No extremity edema. 2+ pedal pulses. No carotid bruits.  Abdomen: Soft, no tenderness, no masses palpated. No hepatosplenomegaly. Bowel sounds positive.  Musculoskeletal: no clubbing / cyanosis. Good ROM, no contractures. Normal  muscle tone.  Skin: no rashes, lesions, ulcers on limited dermatological examination. Neurologic: CN 2-12 grossly intact. Sensation intact, DTR normal. Strength 5/5 in all 4.  Psychiatric: Normal judgment and insight. Alert and oriented x 3. Normal mood.   Labs on Admission: I have personally reviewed following labs and imaging studies  CBC: Recent Labs  Lab 05/07/19 0318  WBC 5.5  NEUTROABS 2.9  HGB 13.2  HCT 41.2  MCV 84.9  PLT 176   Basic Metabolic Panel: Recent Labs  Lab 05/07/19 0318  NA 140  K 3.6  CL 107  CO2 25  GLUCOSE 113*  BUN 16  CREATININE 0.74  CALCIUM 9.1   GFR: Estimated Creatinine Clearance: 70.9 mL/min (by C-G formula based on SCr of 0.74 mg/dL). Liver Function Tests: No results for input(s): AST, ALT, ALKPHOS, BILITOT, PROT, ALBUMIN in the last 168 hours. No results for input(s): LIPASE, AMYLASE in the last 168 hours. No results for input(s): AMMONIA  in the last 168 hours. Coagulation Profile: No results for input(s): INR, PROTIME in the last 168 hours. Cardiac Enzymes: Recent Labs  Lab 05/07/19 0318  TROPONINI <0.03   BNP (last 3 results) No results for input(s): PROBNP in the last 8760 hours. HbA1C: No results for input(s): HGBA1C in the last 72 hours. CBG: No results for input(s): GLUCAP in the last 168 hours. Lipid Profile: No results for input(s): CHOL, HDL, LDLCALC, TRIG, CHOLHDL, LDLDIRECT in the last 72 hours. Thyroid Function Tests: No results for input(s): TSH, T4TOTAL, FREET4, T3FREE, THYROIDAB in the last 72 hours. Anemia Panel: No results for input(s): VITAMINB12, FOLATE, FERRITIN, TIBC, IRON, RETICCTPCT in the last 72 hours. Urine analysis: No results found for: COLORURINE, APPEARANCEUR, LABSPEC, PHURINE, GLUCOSEU, HGBUR, BILIRUBINUR, KETONESUR, PROTEINUR, UROBILINOGEN, NITRITE, LEUKOCYTESUR  Radiological Exams on Admission: Dg Chest 2 View  Result Date: 05/07/2019 CLINICAL DATA:  Altered mental status EXAM: CHEST - 2  VIEW COMPARISON:  02/09/2019 FINDINGS: Mild interstitial opacities are unchanged. Normal cardiomediastinal contours. No focal airspace consolidation. No pleural effusion or pneumothorax. IMPRESSION: No active cardiopulmonary disease. Electronically Signed   By: Deatra Robinson M.D.   On: 05/07/2019 04:05   Ct Head Wo Contrast  Result Date: 05/07/2019 CLINICAL DATA:  Altered level of consciousness (LOC), unexplained EXAM: CT HEAD WITHOUT CONTRAST TECHNIQUE: Contiguous axial images were obtained from the base of the skull through the vertex without intravenous contrast. COMPARISON:  None. FINDINGS: Brain: No intracranial hemorrhage, mass effect, or midline shift. No hydrocephalus. The basilar cisterns are patent. No evidence of territorial infarct or acute ischemia. Generalized cerebral atrophy, normal for age. Mild chronic small vessel ischemia. No extra-axial or intracranial fluid collection. Vascular: No hyperdense vessel. Skull: No fracture or focal lesion. Sinuses/Orbits: Paranasal sinuses and mastoid air cells are clear. The visualized orbits are unremarkable. Other: None. IMPRESSION: No acute intracranial abnormality. Electronically Signed   By: Narda Rutherford M.D.   On: 05/07/2019 03:56   Echocardiogram 02/10/2018  IMPRESSIONS:   1. The left ventricle has moderately reduced systolic function, with an ejection fraction of 35-40%. The cavity size was normal. Left ventricular diastolic Doppler parameters are consistent with impaired relaxation.  2. Severe hypokinesis of the left ventricular mid anteroseptum and inferoseptum, anteroapex and apical septum.  3. Moderate hypokinesis of the left ventricular basal anteroseptum and basal inferoseptum.  4. The tricuspid valve is normal in structure.  5. The pulmonic valve was normal in structure.  6. The mitral valve is normal in structure.  7. The aortic valve is normal in structure. Mild sclerosis of the aortic valve.  EKG: Independently reviewed.   Vent. rate 63 BPM PR interval * ms QRS duration 105 ms QT/QTc 429/440 ms P-R-T axes 13 87 92 Sinus rhythm Anteroseptal infarct, age indeterminate No acute changes. Not significantly changed from previous.  Assessment/Plan Principal Problem:   Near syncope Observation/telemetry. Trend troponin level. Check carotid Doppler. Check echocardiogram. Hold Coreg for now. May need to decrease dose of beta-blocker. Could Ambien be playing a role? (Her floor looked "funny")  Active Problems:   CAD (coronary artery disease) Continue aspirin 81 mg p.o. daily. Continue Brilinta 90 mg p.o. twice daily. Hold carvedilol while bradycardic. May need to adjust to a lower dose.    Hyperlipidemia Continue atorvastatin 80 mg p.o. daily. Monitor LFTs periodically. Fasting lipid profile follow-up as an outpatient.    Hypertension Continue losartan 25 mg p.o. daily. Hold carvedilol for now. Monitor blood pressure, heart rate, renal function electrolytes.    Depression  Continue sertraline 100 mg p.o. daily. Continue bupropion 300 mg p.o. daily.   DVT prophylaxis: Lovenox SQ. Code Status: Full code. Family Communication: Disposition Plan: Observation for near syncopal episode. Consults called: Admission status: Observation/telemetry.   Bobette Moavid Manuel Irl Bodie MD [facility]  05/07/2019, 5:10 AM   This document was prepared using Dragon voice recognition software and may contain some unintended transcription errors.

## 2019-05-07 NOTE — ED Provider Notes (Signed)
[facility] Provider Note   CSN: [id number] Arrival date & time: 05/07/19  0204    History   Chief Complaint Chief Complaint  Patient presents with   Fall    HPI Rachel Kennedy is a 73 y.o. female.     Patient with history of hypertension, hyperlipidemia, recent STEMI in February 2020 presenting from home with episode of near syncope and lowering herself to the floor.  States she had a normal day was getting ready for bed in her bathroom.  She used the toilet without difficulty while standing at the sink she said that all of a sudden "the floor looked wavy" in her "feet would not work like she wanted them to".  She stated it looked like the carpet had waves in it. she denies getting up too fast.  She denies straining on the toilet.  She lowered herself to the ground but did not strike her head or lose consciousness.  She had to be helped up by her significant other with some difficulty.  Denies having any chest pain, shortness of breath, abdominal pain, nausea or vomiting.  Denies any diaphoresis.  Denies any dizziness or lightheadedness.  Denies any focal weakness, numbness or tingling.  Denies any difficulty speaking or difficulty swallowing.  She feels back to baseline now.   Fall  Pertinent negatives include no chest pain, no abdominal pain, no headaches and no shortness of breath.    Past Medical History:  Diagnosis Date   Acute systolic (congestive) heart failure (HCC)    Hyperlipidemia    Hypertension    STEMI (ST elevation myocardial infarction) [facility](HCC)     Patient Active Problem List   Diagnosis Date Noted   Hyperlipidemia 02/11/2019   Acute systolic heart failure (HCC) 02/11/2019   Acute ST elevation myocardial infarction (STEMI) of anterior wall (HCC) 02/09/2019   Acute ST elevation myocardial infarction (STEMI) involving left anterior descending (LAD) coronary artery (HCC) 02/09/2019    Past Surgical History:  Procedure Laterality  Date   CORONARY/GRAFT ACUTE MI REVASCULARIZATION N/A 02/09/2019   Procedure: CORONARY/GRAFT ACUTE MI REVASCULARIZATION;  Surgeon: SwazilandJordan, Peter M, MD;  [facility];  Service: Cardiovascular;  Laterality: N/A;   FRACTURE SURGERY     LEFT HEART CATH AND CORONARY ANGIOGRAPHY N/A 02/09/2019   Procedure: LEFT HEART CATH AND CORONARY ANGIOGRAPHY;  Surgeon: SwazilandJordan, Peter M, MD;  [facility];  Service: Cardiovascular;  Laterality: N/A;   OVARY SURGERY       OB History   No obstetric history on file.      Home Medications    Prior to Admission medications   Medication Sig Start Date End Date Taking? Authorizing Provider  acetaminophen (TYLENOL) 500 MG tablet Take 500 mg by mouth every 6 (six) hours as needed for headache (pain).    [provider]  aspirin EC 81 MG tablet Take 1 tablet (81 mg total) by mouth daily. 02/11/19 02/11/20  Arty Baumgartneroberts, Lindsay B, NP  atorvastatin (LIPITOR) 80 MG tablet Take 1 tablet (80 mg total) by mouth daily at 6 PM. 03/06/19   [place]azilandJordan, Peter M, MD  buPROPion (WELLBUTRIN XL) 300 MG 24 hr tablet Take 300 mg by mouth daily. 10/04/14   [provider]  carvedilol (COREG) 6.25 MG tablet TAKE 1 TABLET (6.25 MG TOTAL) BY MOUTH 2 (TWO) TIMES DAILY WITH A MEAL. 03/06/19   Azalee CourseMeng, Hao, PA  cycloSPORINE (RESTASIS) 0.05 % ophthalmic emulsion Place 1 drop into both eyes  2 (two) times daily.    [provider]  doxycycline (PERIOSTAT) 20 MG tablet Take 20 mg by mouth 2 (two) times daily. 11/17/14   [provider]  losartan (COZAAR) 25 MG tablet Take 1 tablet (25 mg total) by mouth daily. 03/06/19   [place]il[place], Peter M, MD  Melatonin 10 MG TABS Take 10 mg by mouth at bedtime.    [provider]  nitroGLYCERIN (NITROSTAT) 0.4 MG SL tablet Place 1 tablet (0.4 mg total) under the tongue every 5 (five) minutes x 3 doses as needed for chest pain. 02/11/19   Arty Baumgartner, NP  PRESCRIPTION MEDICATION Apply 1  application topically daily as needed (arthritis pain). Arthritis cream compounded at [facility]   Diclofenac 3% Baclofen 2% gabapentin 5% Lidocaine 5% Menthol 1%    [provider]  sertraline (ZOLOFT) 100 MG tablet Take 100 mg by mouth at bedtime.     [provider]  ticagrelor (BRILINTA) 90 MG TABS tablet Take 1 tablet (90 mg total) by mouth 2 (two) times daily. 02/11/19   Arty Baumgartner, NP  zolpidem (AMBIEN) 10 MG tablet Take 10 mg by mouth at bedtime as needed for sleep.  01/22/19   [provider]    Family History Family History  Problem Relation Age of Onset   Heart disease Father        CABG   Transient ischemic attack Mother     Social History Social History   Tobacco Use   Smoking status: Former Smoker   Smokeless tobacco: Never Used   Tobacco comment: quit in 2017. Smoked for 30 years prior to that.   Substance Use Topics   Alcohol use: No    Alcohol/week: 0.0 standard drinks   Drug use: No     Allergies   Patient has no known allergies.   Review of Systems Review of Systems  Constitutional: Negative for activity change, appetite change and fever.  HENT: Negative for congestion.   Eyes: Positive for visual disturbance.  Respiratory: Negative for cough, chest tightness and shortness of breath.   Cardiovascular: Negative for chest pain.  Gastrointestinal: Negative for abdominal pain, nausea and vomiting.  Genitourinary: Negative for dysuria and hematuria.  Musculoskeletal: Negative for arthralgias and myalgias.  Skin: Negative for rash.  Neurological: Positive for syncope. Negative for dizziness, weakness, light-headedness and headaches.   all other systems are negative except as noted in the HPI and PMH.     Physical Exam Updated Vital Signs BP 135/73    Pulse (!) 56    Temp 97.6 F (36.4 C)    Resp 15    Ht  (1.702 m)    Wt 81.6 kg    SpO2 97%    BMI 28.19 kg/m   Physical Exam Vitals signs and  nursing note reviewed.  Constitutional:      General: She is not in acute distress.    Appearance: She is well-developed.  HENT:     Head: Normocephalic and atraumatic.     Mouth/Throat:     Mouth: Mucous membranes are dry.     Pharynx: No oropharyngeal exudate.  Eyes:     Conjunctiva/sclera: Conjunctivae normal.     Pupils: Pupils are equal, round, and reactive to light.  Neck:     Musculoskeletal: Normal range of motion and neck supple.     Comments: No meningismus. Cardiovascular:     Rate and Rhythm: Normal rate and regular rhythm.     Heart  sounds: Normal heart sounds. No murmur.  Pulmonary:     Effort: Pulmonary effort is normal. No respiratory distress.     Breath sounds: Normal breath sounds.  Abdominal:     Palpations: Abdomen is soft.     Tenderness: There is no abdominal tenderness. There is no guarding or rebound.  Musculoskeletal: Normal range of motion.        General: No tenderness.  Skin:    General: Skin is warm.     Capillary Refill: Capillary refill takes less than 2 seconds.  Neurological:     General: No focal deficit present.     Mental Status: She is alert and oriented to person, place, and time. Mental status is at baseline.     Cranial Nerves: No cranial nerve deficit.     Motor: No abnormal muscle tone.     Coordination: Coordination normal.     Comments: No ataxia on finger to nose bilaterally. No pronator drift. 5/5 strength throughout. CN 2-12 intact.Equal grip strength. Sensation intact.   Psychiatric:        Behavior: Behavior normal.      ED Treatments / Results  Labs (all labs ordered are listed, but only abnormal results are displayed) Labs Reviewed  BASIC METABOLIC PANEL - Abnormal; Notable for the following components:      Result Value   Glucose, Bld 113 (*)    All other components within normal limits  BRAIN NATRIURETIC PEPTIDE - Abnormal; Notable for the following components:   B Natriuretic Peptide 167.0 (*)    All other  components within normal limits  SARS CORONAVIRUS 2 (HOSPITAL ORDER, PERFORMED IN [facility])  CBC WITH DIFFERENTIAL/PLATELET  TROPONIN I  D-DIMER, QUANTITATIVE (NOT AT [facility])  MAGNESIUM  PHOSPHORUS    EKG EKG Interpretation  Date/Time:  Sunday May 07 2019 02:25:44 EDT Ventricular Rate:  63 PR Interval:    QRS Duration: 105 QT Interval:  429 QTC Calculation: 440 R Axis:   87 Text Interpretation:  Sinus rhythm Anteroseptal infarct, age indeterminate similar T wave inversions anteriorly Confirmed by Glynn Octave [phone]) on 05/07/2019 2:44:32 AM   Radiology Dg Chest 2 View  Result Date: 05/07/2019 CLINICAL DATA:  Altered mental status EXAM: CHEST - 2 VIEW COMPARISON:  02/09/2019 FINDINGS: Mild interstitial opacities are unchanged. Normal cardiomediastinal contours. No focal airspace consolidation. No pleural effusion or pneumothorax. IMPRESSION: No active cardiopulmonary disease. Electronically Signed   By: Deatra Robinson M.D.   On: 05/07/2019 04:05   Ct Head Wo Contrast  Result Date: 05/07/2019 CLINICAL DATA:  Altered level of consciousness (LOC), unexplained EXAM: CT HEAD WITHOUT CONTRAST TECHNIQUE: Contiguous axial images were obtained from the base of the skull through the vertex without intravenous contrast. COMPARISON:  None. FINDINGS: Brain: No intracranial hemorrhage, mass effect, or midline shift. No hydrocephalus. The basilar cisterns are patent. No evidence of territorial infarct or acute ischemia. Generalized cerebral atrophy, normal for age. Mild chronic small vessel ischemia. No extra-axial or intracranial fluid collection. Vascular: No hyperdense vessel. Skull: No fracture or focal lesion. Sinuses/Orbits: Paranasal sinuses and mastoid air cells are clear. The visualized orbits are unremarkable. Other: None. IMPRESSION: No acute intracranial abnormality. Electronically Signed   By: Narda Rutherford M.D.   On: 05/07/2019 03:56    Procedures Procedures  (including critical care time)  Medications Ordered in ED Medications - No data to display   Initial Impression / Assessment and Plan / ED Course  I have reviewed the triage vital signs and  the nursing notes.  Pertinent labs & imaging results that were available during my care of the patient were reviewed by me and considered in my medical decision making (see chart for details).       Episode of near syncope with visual changes and difficulty walking.  No trauma or falls.  Back to baseline now with nonfocal neuro exam.  EKG with sinus rhythm and similar anterior T wave inversions.  Orthostatics are negative.  Patient's echo in February did show reduced EF of 40% with diffuse hypokinesis.  Work-up is reassuring with normal labs including troponin and d-dimer.  CT head negative.  Feels back to baseline and denies any dizziness or lightheadedness.  No chest pain or shortness of breath.  Unclear etiology of exactly what happened.  Sounds like episode of near syncope without prodrome.  Given patient's cardiac history and CHF history, feel observation admission warranted to evaluate for arrhythmia versus ACS. Patient agrees.  Discussed with Dr. Robb Matar.    Final Clinical Impressions(s) / ED Diagnoses   Final diagnoses:  Near syncope    ED Discharge Orders    None       Melvyn Hommes, Jeannett Senior, MD 05/07/19 [phone]

## 2019-05-07 NOTE — ED Triage Notes (Signed)
Pt stated that she was in the bathroom getting ready for bed when she noticed the floor looked funny, her knees got weak, and she lowered her self on the floor.  Feels fine now. Denies dizziness, getting up too fast, or having bm prior to event.

## 2019-05-07 NOTE — ED Triage Notes (Signed)
Did not hit anything when lowered self to floor

## 2019-05-07 NOTE — Progress Notes (Signed)
2d echo completed.   Tayla Panozzo, RDCS 

## 2019-05-07 NOTE — Discharge Summary (Signed)
Physician Discharge Summary  Rachel Kennedy IDP:[id number] DOB: Jul 29, 1946 DOA: 05/07/2019  PCP: Assunta Found, MD  Admit date: 05/07/2019 Discharge date: 05/18/2019  Admitted From: home Disposition:  home   Recommendations for Outpatient Follow-up:  1. Will need surveillance for carotid artery disease  Discharge Condition:  stable   CODE STATUS:  Full code   Consultations:  none    Discharge Diagnoses:  Principal Problem:   Near syncope Active Problems:   Hyperlipidemia   Hypertension   CAD (coronary artery disease)   Depression   Carotid artery stenosis, asymptomatic, bilateral   CAD (coronary artery disease), native coronary artery   Chronic systolic CHF (congestive heart failure) (HCC)    HPI: Rachel Kennedy is a 73 y.o. female with medical history significant of recent STEMI in February which produced acute systolic CHF, depression, hyperlipidemia, hypertension who is coming to the emergency department with complaints of feeling dizzy while she was in her bathroom at home getting ready for bed.  The patient states that she looked towards the floor and noticed that he is seemed " funny", her knees and legs became weak and she lowered herself to the floor.  The symptoms resolved shortly after that, but given her recent cardiovascular event she decided to come to the emergency department.  She denied any recent positional or post defecation lightheadedness.  No chest pain, palpitations, diaphoresis, PND, orthopnea or pitting edema of the lower extremities.  Denies headache, fever, chills, sore throat, rhinorrhea, wheezing, hemoptysis, abdominal pain, nausea or emesis, diarrhea or constipation, melena or hematochezia.  Denies dysuria frequency or hematuria.  Denies polyuria, polydipsia, polyphagia or blurred vision.  Hospital Course:  Near syncope - etiology undetermined- she was not orthostatic, troponin were normal, d dimer was normal - head CT unrevealing -carotid duplex  ordered by admitting doctor>  Moderate to large amount of bilateral atherosclerotic plaque, right greater than left, results in elevated peak systolic velocities within the bilateral internal carotid arteries, the right >70%, and the left compatible with the 50-69% luminal narrowing range. - f/u CTA ordered which revealed 60 % stenosis if left ICA and 50% in right ICA- she was recommended to continues aspirin and statin    Discharge Exam: Vitals:   05/07/19 0602 05/07/19 1435  BP: (!) 149/65 (!) 163/98  Pulse: (!) 58 66  Resp: 20 20  Temp: (!) 97.5 F (36.4 C) 98.1 F (36.7 C)  SpO2: 96% 99%   Vitals:   05/07/19 0500 05/07/19 0600 05/07/19 0602 05/07/19 1435  BP: 136/68  (!) 149/65 (!) 163/98  Pulse:   (!) 58 66  Resp: 15  20 20   Temp:   (!) 97.5 F (36.4 C) 98.1 F (36.7 C)  TempSrc:   Oral Oral  SpO2: 96%  96% 99%  Weight:  83.1 kg    Height:  5\' 7"  (1.702 m)      General: Pt is alert, awake, not in acute distress Cardiovascular: RRR, S1/S2 +, no rubs, no gallops Respiratory: CTA bilaterally, no wheezing, no rhonchi Abdominal: Soft, NT, ND, bowel sounds + Extremities: no edema, no cyanosis   Discharge Instructions  Discharge Instructions    Diet - low sodium heart healthy   Complete by:  As directed    Increase activity slowly   Complete by:  As directed      Allergies as of 05/07/2019   No Known Allergies     Medication List    TAKE these medications   acetaminophen 500 MG tablet  Commonly known as:  TYLENOL Take 500 mg by mouth every 6 (six) hours as needed for headache (pain).   aspirin EC 81 MG tablet Take 1 tablet (81 mg total) by mouth daily.   atorvastatin 80 MG tablet Commonly known as:  LIPITOR Take 1 tablet (80 mg total) by mouth daily at 6 PM.   buPROPion 300 MG 24 hr tablet Commonly known as:  WELLBUTRIN XL Take 300 mg by mouth daily.   carvedilol 6.25 MG tablet Commonly known as:  COREG TAKE 1 TABLET (6.25 MG TOTAL) BY MOUTH 2  (TWO) TIMES DAILY WITH A MEAL.   cycloSPORINE 0.05 % ophthalmic emulsion Commonly known as:  RESTASIS Place 1 drop into both eyes 2 (two) times daily.   doxycycline 20 MG tablet Commonly known as:  PERIOSTAT Take 20 mg by mouth 2 (two) times daily.   losartan 25 MG tablet Commonly known as:  COZAAR Take 1 tablet (25 mg total) by mouth daily.   Melatonin 10 MG Tabs Take 10 mg by mouth at bedtime.   nitroGLYCERIN 0.4 MG SL tablet Commonly known as:  NITROSTAT Place 1 tablet (0.4 mg total) under the tongue every 5 (five) minutes x 3 doses as needed for chest pain.   PRESCRIPTION MEDICATION Apply 1 application topically daily as needed (arthritis pain). Arthritis cream compounded at [facility]   Diclofenac 3% Baclofen 2% gabapentin 5% Lidocaine 5% Menthol 1%   sertraline 100 MG tablet Commonly known as:  ZOLOFT Take 100 mg by mouth at bedtime.   ticagrelor 90 MG Tabs tablet Commonly known as:  BRILINTA Take 1 tablet (90 mg total) by mouth 2 (two) times daily.   zolpidem 10 MG tablet Commonly known as:  AMBIEN Take 10 mg by mouth at bedtime as needed for sleep.       No Known Allergies   Procedures/Studies:   Dg Chest 2 View  Result Date: 05/07/2019 CLINICAL DATA:  Altered mental status EXAM: CHEST - 2 VIEW COMPARISON:  02/09/2019 FINDINGS: Mild interstitial opacities are unchanged. Normal cardiomediastinal contours. No focal airspace consolidation. No pleural effusion or pneumothorax. IMPRESSION: No active cardiopulmonary disease. Electronically Signed   By: Deatra Robinson M.D.   On: 05/07/2019 04:05   Ct Head Wo Contrast  Result Date: 05/07/2019 CLINICAL DATA:  Altered level of consciousness (LOC), unexplained EXAM: CT HEAD WITHOUT CONTRAST TECHNIQUE: Contiguous axial images were obtained from the base of the skull through the vertex without intravenous contrast. COMPARISON:  None. FINDINGS: Brain: No intracranial hemorrhage, mass effect, or midline shift.  No hydrocephalus. The basilar cisterns are patent. No evidence of territorial infarct or acute ischemia. Generalized cerebral atrophy, normal for age. Mild chronic small vessel ischemia. No extra-axial or intracranial fluid collection. Vascular: No hyperdense vessel. Skull: No fracture or focal lesion. Sinuses/Orbits: Paranasal sinuses and mastoid air cells are clear. The visualized orbits are unremarkable. Other: None. IMPRESSION: No acute intracranial abnormality. Electronically Signed   By: Narda Rutherford M.D.   On: 05/07/2019 03:56   Ct Angio Neck W Or Wo Contrast  Result Date: 05/07/2019 CLINICAL DATA:  Syncopal episode.  Abnormal carotid ultrasound. EXAM: CT ANGIOGRAPHY NECK TECHNIQUE: Multidetector CT imaging of the neck was performed using the standard protocol during bolus administration of intravenous contrast. Multiplanar CT image reconstructions and MIPs were obtained to evaluate the vascular anatomy. Carotid stenosis measurements (when applicable) are obtained utilizing NASCET criteria, using the distal internal carotid diameter as the denominator. CONTRAST:  75mL OMNIPAQUE IOHEXOL 350 MG/ML SOLN COMPARISON:  Carotid Doppler study 05/07/2019 FINDINGS: Aortic arch: There is a common origin of the left common carotid artery and the innominate artery. A hypoplastic left vertebral artery originates directly from the arch. Atherosclerotic changes are present at the aortic arch. There is no significant aneurysm. There is no stenosis of the great vessel origins. Right carotid system: The right common carotid artery is mildly tortuous. Atherosclerotic changes are noted at the bifurcation. Minimal luminal diameter is 4 mm. This compares to a more normal distal vessel of 5 mm. The more distal right ICA is moderately tortuous without significant stenosis. Visualized intracranial portions of the ICA demonstrates some atherosclerotic change without a significant stenosis. Left carotid system: The left common  carotid artery demonstrates distal luminal plaque without a significant stenosis relative to the distal vessel. Atherosclerotic changes are present at the bifurcation. The lumen is narrowed to 2 mm. This compares with a more distal lumen of 4.5 mm at the skull base. Vertebral arteries: The right vertebral artery originates from the subclavian without significant stenosis. It is the non dominant vessel. A dominant left vertebral artery originates from the subclavian artery. The hypoplastic branch from the aorta merges at the C4 level. There is no significant stenosis of either vertebral artery in the neck. PICA origins are visualized. The hypoplastic right vertebral artery continues to the vertebrobasilar junction. The basilar artery is normal. Skeleton: Degenerative anterolisthesis present at C4-5 and to a greater extent at C5-6. There is some uncovertebral spurring at C3-4 and C4-5, right greater than left. No focal lytic or blastic lesions are present. Vertebral body heights are maintained. Other neck: Thyroid goiter is noted. Dominant lesion in the left lobe of the thyroid measures 10 mm. No follow-up is necessary. Upper chest: Centrilobular emphysematous changes are present. Ground-glass attenuation is noted dependently, likely reflecting atelectasis. IMPRESSION: 1. 60% stenosis of the proximal left internal carotid artery with extensive atherosclerotic irregularity. 2. Less than 50% stenosis of the proximal right ICA due to atherosclerotic change. 3. Additional atherosclerotic changes within the cavernous internal carotid arteries bilaterally without significant stenosis. 4. Tortuosity of the distal ICA bilaterally. This is nonspecific, but most commonly seen in the setting of chronic hypertension. 5. 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 6.  Emphysema (ICD10-J43.9). Electronically Signed   By: Marin Robertshristopher  Mattern M.D.   On: 05/07/2019  16:01   [place] Carotid Bilateral  Result Date: 05/07/2019 CLINICAL DATA:  Syncopal episode earlier today. History of CAD, hypertension and hyperlipidemia. EXAM: BILATERAL CAROTID DUPLEX ULTRASOUND TECHNIQUE: Wallace CullensGray scale imaging, color Doppler and duplex ultrasound were performed of bilateral carotid and vertebral arteries in the neck. COMPARISON:  None. FINDINGS: Criteria: Quantification of carotid stenosis is based on velocity parameters that correlate the residual internal carotid diameter with NASCET-based stenosis levels, using the diameter of the distal internal carotid lumen as the denominator for stenosis measurement. The following velocity measurements were obtained: RIGHT ICA: 177/28 cm/sec CCA: 119/13 cm/sec SYSTOLIC ICA/CCA RATIO:  1.5 ECA: 134 cm/sec LEFT ICA: 300/64 cm/sec CCA: 113/11 cm/sec SYSTOLIC ICA/CCA RATIO:  2.6 ECA: 141 cm/sec RIGHT CAROTID ARTERY: There is a moderate amount of circumferential mixed echogenic plaque within the right carotid bulb (images 14 and 16), extending to involve the origin and proximal aspects of the right internal carotid artery (is 24, which results in elevated peak systolic velocities throughout the interrogated course the right internal carotid artery. Greatest acquired peak systolic velocity within the mid aspect the right internal  carotid artery measures 177 centimeters/second (image 29). RIGHT VERTEBRAL ARTERY:  Antegrade Flow LEFT CAROTID ARTERY: Circumferential intimal thickening/hypoechoic plaque throughout the left common carotid artery (image 41, 44 and 45). There is a moderate to large amount of eccentric mixed echogenic plaque within the left carotid bulb (image 50), extending to involve the origin and proximal aspect the left internal carotid artery (image 58, which results in elevated peak systolic velocities within the proximal mid aspects of the left internal carotid artery. Greatest acquired peak systolic velocity within the proximal left ICA measures  300 centimeters/second (image 60). LEFT VERTEBRAL ARTERY:  Antegrade Flow IMPRESSION: Moderate to large amount of bilateral atherosclerotic plaque, right greater than left, results in elevated peak systolic velocities within the bilateral internal carotid arteries, the right >70%, and the left compatible with the 50-69% luminal narrowing range. Further evaluation with CTA could be performed as clinically indicated. Electronically Signed   By: Simonne Come M.D.   On: 05/07/2019 11:01     The results of significant diagnostics from this hospitalization (including imaging, microbiology, ancillary and laboratory) are listed below for reference.     Microbiology: No results found for this or any previous visit (from the past 240 hour(s)).   Labs: BNP (last 3 results) Recent Labs    05/07/19 0318  BNP 167.0*   Basic Metabolic Panel: No results for input(s): NA, K, CL, CO2, GLUCOSE, BUN, CREATININE, CALCIUM, MG, PHOS in the last 168 hours. Liver Function Tests: No results for input(s): AST, ALT, ALKPHOS, BILITOT, PROT, ALBUMIN in the last 168 hours. No results for input(s): LIPASE, AMYLASE in the last 168 hours. No results for input(s): AMMONIA in the last 168 hours. CBC: No results for input(s): WBC, NEUTROABS, HGB, HCT, MCV, PLT in the last 168 hours. Cardiac Enzymes: No results for input(s): CKTOTAL, CKMB, CKMBINDEX, TROPONINI in the last 168 hours. BNP: Invalid input(s): POCBNP CBG: No results for input(s): GLUCAP in the last 168 hours. D-Dimer No results for input(s): DDIMER in the last 72 hours. Hgb A1c No results for input(s): HGBA1C in the last 72 hours. Lipid Profile No results for input(s): CHOL, HDL, LDLCALC, TRIG, CHOLHDL, LDLDIRECT in the last 72 hours. Thyroid function studies No results for input(s): TSH, T4TOTAL, T3FREE, THYROIDAB in the last 72 hours.  Invalid input(s): FREET3 Anemia work up No results for input(s): VITAMINB12, FOLATE, FERRITIN, TIBC, IRON,  RETICCTPCT in the last 72 hours. Urinalysis No results found for: COLORURINE, APPEARANCEUR, LABSPEC, PHURINE, GLUCOSEU, HGBUR, BILIRUBINUR, KETONESUR, PROTEINUR, UROBILINOGEN, NITRITE, LEUKOCYTESUR Sepsis Labs Invalid input(s): PROCALCITONIN,  WBC,  LACTICIDVEN Microbiology No results found for this or any previous visit (from the past 240 hour(s)).   Time coordinating discharge in minutes: 65  SIGNED:   Calvert Cantor, MD  [facility] 05/18/2019, 10:53 AM Pager   If 7PM-7AM, please contact night-coverage [email or web address] Password [email or web address]

## 2019-05-10 ENCOUNTER — Other Ambulatory Visit (HOSPITAL_COMMUNITY): Payer: Self-pay | Admitting: Family Medicine

## 2019-05-10 DIAGNOSIS — Z1231 Encounter for screening mammogram for malignant neoplasm of breast: Secondary | ICD-10-CM

## 2019-05-18 DIAGNOSIS — I2510 Atherosclerotic heart disease of native coronary artery without angina pectoris: Secondary | ICD-10-CM

## 2019-05-18 DIAGNOSIS — I6523 Occlusion and stenosis of bilateral carotid arteries: Secondary | ICD-10-CM

## 2019-05-18 DIAGNOSIS — I5022 Chronic systolic (congestive) heart failure: Secondary | ICD-10-CM

## 2019-05-22 ENCOUNTER — Ambulatory Visit (HOSPITAL_COMMUNITY)
Admission: RE | Admit: 2019-05-22 | Discharge: 2019-05-22 | Disposition: A | Discharge status: Home / Self Care | Payer: Medicare Other | Source: Ambulatory Visit | Attending: Family Medicine | Admitting: Family Medicine

## 2019-05-22 ENCOUNTER — Other Ambulatory Visit: Payer: Self-pay

## 2019-05-22 DIAGNOSIS — Z1231 Encounter for screening mammogram for malignant neoplasm of breast: Secondary | ICD-10-CM | POA: Diagnosis not present

## 2019-05-29 ENCOUNTER — Telehealth (HOSPITAL_COMMUNITY): Payer: Self-pay | Admitting: *Deleted

## 2019-05-29 ENCOUNTER — Other Ambulatory Visit: Payer: Self-pay

## 2019-05-29 ENCOUNTER — Ambulatory Visit (HOSPITAL_COMMUNITY)
Admission: RE | Admit: 2019-05-29 | Discharge: 2019-05-29 | Disposition: A | Discharge status: Home / Self Care | Payer: Medicare Other | Source: Ambulatory Visit | Attending: Cardiology | Admitting: Cardiology

## 2019-05-29 NOTE — Telephone Encounter (Signed)
Called patient to check on her downloading the [place]. Patient does not have a smartphone so she was not able to put on computer, she will try her laptop. She definitely want to do the regular program.

## 2019-06-01 ENCOUNTER — Telehealth (HOSPITAL_COMMUNITY): Payer: Self-pay | Admitting: *Deleted

## 2019-06-01 NOTE — Telephone Encounter (Signed)
Called patient to inform that the virtual program would not work for her because she does not have a smart phone. We will call her when we re-open. Marland Kitchen

## 2019-06-02 ENCOUNTER — Other Ambulatory Visit: Payer: Self-pay | Admitting: Cardiology

## 2019-06-05 ENCOUNTER — Other Ambulatory Visit: Payer: Self-pay | Admitting: Cardiology

## 2019-06-06 ENCOUNTER — Telehealth: Payer: Self-pay | Admitting: Cardiology

## 2019-06-06 NOTE — Telephone Encounter (Addendum)
Has concerns of shortness of breath/home phone only/ consent/ my chart active/ pre reg completed

## 2019-06-07 ENCOUNTER — Ambulatory Visit (HOSPITAL_COMMUNITY)
Admission: RE | Admit: 2019-06-07 | Discharge: 2019-06-07 | Disposition: A | Discharge status: Home / Self Care | Payer: Medicare Other | Source: Ambulatory Visit | Attending: Physician Assistant | Admitting: Physician Assistant

## 2019-06-07 ENCOUNTER — Other Ambulatory Visit: Payer: Self-pay

## 2019-06-07 DIAGNOSIS — I255 Ischemic cardiomyopathy: Secondary | ICD-10-CM | POA: Insufficient documentation

## 2019-06-07 NOTE — Progress Notes (Signed)
Virtual Visit via Telephone Note   This visit type was conducted due to national recommendations for restrictions regarding the COVID-19 Pandemic (e.g. social distancing) in an effort to limit this patient's exposure and mitigate transmission in our community.  Due to her co-morbid illnesses, this patient is at least at moderate risk for complications without adequate follow up.  This format is felt to be most appropriate for this patient at this time.  The patient did not have access to video technology/had technical difficulties with video requiring transitioning to audio format only (telephone).  All issues noted in this document were discussed and addressed.  No physical exam could be performed with this format.  Please refer to the patient's chart for her  consent to telehealth for [facility].   Date:  06/09/2019   ID:  Rachel Kennedy, DOB 01/07/1946, MRN [id number]  Patient Location: Home Provider Location: Office  PCP:  Assunta FoundGolding, John, MD  Cardiologist:  Shahiem Bedwell [place], MD  Electrophysiologist:  None   Evaluation Performed:  Follow-Up Visit  Chief Complaint:  Follow up CAD  History of Present Illness:    Rachel Kennedy is a 73 y.o. female with PMH of HTN, HLD, and  CAD.  Patient was admitted to the hospital on 02/09/2019 with code STEMI.  Initial EKG was consistent with anterior STEMI.   Initial troponin was 0.24.  Patient underwent emergent cardiac catheterization on 02/09/2019 which revealed 50% proximal to mid RCA lesion, 50% ostial to proximal left circumflex lesion, 80% mid to distal LAD lesion, 100% ostial to proximal LAD occlusion treated with Onyx resolute 3.0 x 30 mm DES, EF 25 to 35%, trivial 1+ MR.  Her troponin peaked at 57.07 before trending back down.  Echocardiogram obtained on the following day showed EF 35 to 40%, moderate hypokinesis of the LV basal anteroseptal and basal inferoseptal region, mild sclerosis of the aortic valve.  Lipid panel obtained during the  hospital showed LDL 126, HDL 37.  Patient was discharged on aspirin and Brilinta along with carvedilol and losartan.  Blood pressure in the hospital was too low for Entresto.  She was admitted overnight on 05/07/19 with an episode of near syncope. She was not orthostatic. Troponin negative. Carotid dopplers showed moderate bilateral disease with > 70% on the right. CTA revealed 60 % stenosis if left ICA and 50% in right ICA. Head CT was normal.   On follow up today she is feeling very well. Initially post MI she had some SOB/gasping at times but this has resolved. No chest pain. No further episodes of dizziness or lightheadedness. No edema or palpitations.  The patient does not have symptoms concerning for COVID-19 infection (fever, chills, cough, or new shortness of breath).    Past Medical History:  Diagnosis Date   Acute systolic (congestive) heart failure (HCC)    CAD (coronary artery disease) 05/07/2019   Depression 05/07/2019   Hyperlipidemia    Hypertension    STEMI (ST elevation myocardial infarction) [facility](HCC)    Past Surgical History:  Procedure Laterality Date   CORONARY/GRAFT ACUTE MI REVASCULARIZATION N/A 02/09/2019   Procedure: CORONARY/GRAFT ACUTE MI REVASCULARIZATION;  Surgeon: SwazilandJordan, Daniyal Tabor M, MD;  [facility];  Service: Cardiovascular;  Laterality: N/A;   FRACTURE SURGERY     LEFT HEART CATH AND CORONARY ANGIOGRAPHY N/A 02/09/2019   Procedure: LEFT HEART CATH AND CORONARY ANGIOGRAPHY;  Surgeon: [place]ilandJordan, Kallista Pae M, MD;  [facility];  Service: Cardiovascular;  Laterality: N/A;  OVARY SURGERY       Current Meds  Medication Sig   acetaminophen (TYLENOL) 500 MG tablet Take 500 mg by mouth every 6 (six) hours as needed for headache (pain).   aspirin EC 81 MG tablet Take 1 tablet (81 mg total) by mouth daily.   atorvastatin (LIPITOR) 80 MG tablet TAKE 1 TABLET (80 MG TOTAL) BY MOUTH DAILY AT 6 PM.   buPROPion (WELLBUTRIN XL) 300 MG 24  hr tablet Take 300 mg by mouth daily.   carvedilol (COREG) 6.25 MG tablet TAKE 1 TABLET (6.25 MG TOTAL) BY MOUTH 2 (TWO) TIMES DAILY WITH A MEAL.   cycloSPORINE (RESTASIS) 0.05 % ophthalmic emulsion Place 1 drop into both eyes 2 (two) times daily.   doxycycline (PERIOSTAT) 20 MG tablet Take 20 mg by mouth 2 (two) times daily.   losartan (COZAAR) 25 MG tablet TAKE 1 TABLET BY MOUTH EVERY DAY   nitroGLYCERIN (NITROSTAT) 0.4 MG SL tablet Place 1 tablet (0.4 mg total) under the tongue every 5 (five) minutes x 3 doses as needed for chest pain.   PRESCRIPTION MEDICATION Apply 1 application topically daily as needed (arthritis pain). Arthritis cream [facility]   Diclofenac 3% Baclofen 2% gabapentin 5% Lidocaine 5% Menthol 1%   sertraline (ZOLOFT) 100 MG tablet Take 100 mg by mouth at bedtime.    ticagrelor (BRILINTA) 90 MG TABS tablet Take 1 tablet (90 mg total) by mouth 2 (two) times daily.   zolpidem (AMBIEN) 10 MG tablet Take 10 mg by mouth at bedtime as needed for sleep.      Allergies:   Patient has no known allergies.   Social History   Tobacco Use   Smoking status: Former Smoker   Smokeless tobacco: Never Used   Tobacco comment: quit in 2017. Smoked for 30 years prior to that.   Substance Use Topics   Alcohol use: No    Alcohol/week: 0.0 standard drinks   Drug use: No     Family Hx: The patient's family history includes Heart disease in her father; Transient ischemic attack in her mother.  ROS:   Please see the history of present illness.    All other systems reviewed and are negative.   Prior CV studies:   The following studies were reviewed today:  Cath 02/09/2019  Prox RCA to Mid RCA lesion is 50% stenosed.  Ost Cx to Prox Cx lesion is 30% stenosed.  Mid LAD to Dist LAD lesion is 80% stenosed.  Ost LAD to Prox LAD lesion is 100% stenosed.  Post intervention, there is a 0% residual stenosis.  A drug-eluting stent was  successfully placed using a STENT RESOLUTE ONYX 3.0X30.  There is severe left ventricular systolic dysfunction.  LV end diastolic pressure is normal.  The left ventricular ejection fraction is 25-35% by visual estimate.  There is trivial (1+) mitral regurgitation.  1. 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 Diffuse 50% proximal to mid RCA 3. Severe LV dysfunction with anterolateral akinesis. EF 30-35%. 4. Normal LVEDP 5. Successful PCI with stenting of the proximal LAD with DES. There is persistent diffuse narrowing of the mid to distal LAD of unclear etiology ? Spasm  Plan: DAPT for one year. Aggrastat for 6 hours. Optimize medical therapy with statin, beta blocker, ARB. If EF <35% by Echo consider Entresto. May also need to consider Lifevest at DC.    Echo 02/10/2019 1. The left ventricle has moderately reduced systolic function, with an ejection fraction  of 35-40%. The cavity size was normal. Left ventricular diastolic Doppler parameters are consistent with impaired relaxation. 2. Severe hypokinesis of the left ventricular mid anteroseptum and inferoseptum, anteroapex and apical septum. 3. Moderate hypokinesis of the left ventricular basal anteroseptum and basal inferoseptum. 4. The tricuspid valve is normal in structure. 5. The pulmonic valve was normal in structure. 6. The mitral valve is normal in structure. 7. The aortic valve is normal in structure. Mild sclerosis of the aortic valve.  SUMMARY  Endocardial border not optimally visualized for wall motion abnormalities, future exams should be performed with Definity IV LV Enhancement  Echo 05/07/19: IMPRESSIONS    1. The left ventricle has normal systolic function with an ejection fraction of 55-60%. The cavity size was normal. There is moderate concentric left ventricular hypertrophy. Left ventricular diastolic Doppler parameters are consistent with impaired  relaxation. There is hypokinesis of  the mid anteroseptal and apical anterior walls.  2. The right ventricle has normal systolic function. The cavity was normal. There is no increase in right ventricular wall thickness.  3. Left atrial size was mildly dilated.   Carotid dopplers 05/07/19:  CLINICAL DATA:  Syncopal episode earlier today. History of CAD, hypertension and hyperlipidemia.  EXAM: BILATERAL CAROTID DUPLEX ULTRASOUND  TECHNIQUE: Pearline Cables scale imaging, color Doppler and duplex ultrasound were performed of bilateral carotid and vertebral arteries in the neck.  COMPARISON:  None.  FINDINGS: Criteria: Quantification of carotid stenosis is based on velocity parameters that correlate the residual internal carotid diameter with NASCET-based stenosis levels, using the diameter of the distal internal carotid lumen as the denominator for stenosis measurement.  The following velocity measurements were obtained:  RIGHT  ICA: 177/28 cm/sec  CCA: 672/09 cm/sec  SYSTOLIC ICA/CCA RATIO:  1.5  ECA: 134 cm/sec  LEFT  ICA: 300/64 cm/sec  CCA: 470/96 cm/sec  SYSTOLIC ICA/CCA RATIO:  2.6  ECA: 141 cm/sec  RIGHT CAROTID ARTERY: There is a moderate amount of circumferential mixed echogenic plaque within the right carotid bulb (images 14 and 16), extending to involve the origin and proximal aspects of the right internal carotid artery (is 24, which results in elevated peak systolic velocities throughout the interrogated course the right internal carotid artery. Greatest acquired peak systolic velocity within the mid aspect the right internal carotid artery measures 177 centimeters/second (image 29).  RIGHT VERTEBRAL ARTERY:  Antegrade Flow  LEFT CAROTID ARTERY: Circumferential intimal thickening/hypoechoic plaque throughout the left common carotid artery (image 41, 44 and 45). There is a moderate to large amount of eccentric mixed echogenic plaque within the left carotid bulb (image 50),  extending to involve the origin and proximal aspect the left internal carotid artery (image 58, which results in elevated peak systolic velocities within the proximal mid aspects of the left internal carotid artery. Greatest acquired peak systolic velocity within the proximal left ICA measures 300 centimeters/second (image 60).  LEFT VERTEBRAL ARTERY:  Antegrade Flow  IMPRESSION: Moderate to large amount of bilateral atherosclerotic plaque, right greater than left, results in elevated peak systolic velocities within the bilateral internal carotid arteries, the right >70%, and the left compatible with the 50-69% luminal narrowing range.  Further evaluation with CTA could be performed as clinically indicated.   Electronically Signed   By: Sandi Mariscal M.D.   On: 05/07/2019 11:01  Labs/Other Tests and Data Reviewed:    EKG:  No ECG reviewed.  Recent Labs: 02/09/2019: TSH 0.648 04/10/2019: ALT 22 05/07/2019: B Natriuretic Peptide 167.0; BUN 16; Creatinine, Ser 0.74; Hemoglobin  13.2; Magnesium 2.2; Platelets 176; Potassium 3.6; Sodium 140   Recent Lipid Panel Lab Results  Component Value Date/Time   CHOL 133 04/10/2019 11:12 AM   TRIG 100 04/10/2019 11:12 AM   HDL 52 04/10/2019 11:12 AM   CHOLHDL 2.6 04/10/2019 11:12 AM   LDLCALC 61 04/10/2019 11:12 AM    Wt Readings from Last 3 Encounters:  06/09/19 180 lb (81.6 kg)  05/07/19 183 lb 3.2 oz (83.1 kg)  02/27/19 180 lb (81.6 kg)     Objective:    Vital Signs:  BP (!) 102/58    Pulse 60    Ht 5\' 7"  (1.702 m)    Wt 180 lb (81.6 kg)    BMI 28.19 kg/m    VITAL SIGNS:  reviewed  ASSESSMENT & PLAN:    1. CAD: Anterior STEMI with DES to ostial LAD on February 09, 2019.  Continue to have 80% mid to distal LAD lesion that will be treated medically. No angina.   Continue aspirin and Brilinta for one year then stop Brilinta.    2. Ischemic cardiomyopathy: Echo shows normalization of LV function Continue carvedilol and  losartan.    3. Hypertension: Blood pressure is well controlled.  4. Hyperlipidemia: excellent control on lipitor 80 mg daily.   5. Carotid arterial disease- moderate. Continue risk factor modification. Repeat dopplers in 6 months.   COVID-19 Education: The signs and symptoms of COVID-19 were discussed with the patient and how to seek care for testing (follow up with PCP or arrange E-visit).  The importance of social distancing was discussed today.  Time:   Today, I have spent 20 minutes with the patient with telehealth technology discussing the above problems.     Medication Adjustments/Labs and Tests Ordered: Current medicines are reviewed at length with the patient today.  Concerns regarding medicines are outlined above.   Tests Ordered: Carotid dopplers in 6 months.  Medication Changes: No orders of the defined types were placed in this encounter.   Follow Up:  In Person in 6 month(s)  Signed, Antavion Bartoszek [place]Jordan, MD  06/09/2019 11:29 AM    [facility]

## 2019-06-07 NOTE — Progress Notes (Signed)
*  PRELIMINARY RESULTS* Echocardiogram 2D Echocardiogram has been performed.  Rachel Kennedy 06/07/2019, 10:13 AM

## 2019-06-08 ENCOUNTER — Telehealth: Payer: Self-pay

## 2019-06-08 NOTE — Telephone Encounter (Signed)
Patient was called by Almyra Deforest, PA-C who left a voice message for the patient to go over her ECHO results.    Keep appt with Dr. Martinique tomorrow. Pumping function of heart has improved significantly, now the pumping function is normal. I tried to call Mrs. Loe but could not reach her, will ask office staff to give her the result

## 2019-06-09 ENCOUNTER — Encounter: Payer: Self-pay | Admitting: Cardiology

## 2019-06-09 ENCOUNTER — Telehealth (INDEPENDENT_AMBULATORY_CARE_PROVIDER_SITE_OTHER): Payer: Medicare Other | Admitting: Cardiology

## 2019-06-09 ENCOUNTER — Telehealth: Payer: Self-pay | Admitting: Cardiology

## 2019-06-09 VITALS — BP 102/58 | HR 60 | Ht 67.0 in | Wt 180.0 lb

## 2019-06-09 DIAGNOSIS — I2510 Atherosclerotic heart disease of native coronary artery without angina pectoris: Secondary | ICD-10-CM | POA: Diagnosis not present

## 2019-06-09 DIAGNOSIS — I6523 Occlusion and stenosis of bilateral carotid arteries: Secondary | ICD-10-CM

## 2019-06-09 DIAGNOSIS — I255 Ischemic cardiomyopathy: Secondary | ICD-10-CM | POA: Diagnosis not present

## 2019-06-09 DIAGNOSIS — E785 Hyperlipidemia, unspecified: Secondary | ICD-10-CM

## 2019-06-09 DIAGNOSIS — I10 Essential (primary) hypertension: Secondary | ICD-10-CM

## 2019-06-09 NOTE — Addendum Note (Signed)
Addended by: Kathyrn Lass on: 06/09/2019 11:37 AM   Modules accepted: Orders

## 2019-06-09 NOTE — Telephone Encounter (Signed)
LVM for patient to call back and schedule carotid dopplers in 10-2019.

## 2019-06-09 NOTE — Patient Instructions (Signed)
Medication Instructions:  Continue same medications If you need a refill on your cardiac medications before your next appointment, please call your pharmacy.   Lab work: None ordered  Testing/Procedures: Schedule Carotid Dopplers in 6 months Scheduler will call to schedule appointment  Follow-Up: At [facility], you and your health needs are our priority.  As part of our continuing mission to provide you with exceptional heart care, we have created designated Provider Care Teams.  These Care Teams include your primary Cardiologist (physician) and Advanced Practice Providers (APPs -  Physician Assistants and Nurse Practitioners) who all work together to provide you with the care you need, when you need it. . Schedule follow up appointment in 6 months    Call 3 months before to schedule

## 2019-06-27 ENCOUNTER — Encounter (HOSPITAL_COMMUNITY): Payer: Self-pay

## 2019-06-27 ENCOUNTER — Encounter (HOSPITAL_COMMUNITY)
Admission: RE | Admit: 2019-06-27 | Discharge: 2019-06-27 | Disposition: A | Discharge status: Home / Self Care | Payer: Medicare Other | Source: Ambulatory Visit | Attending: Cardiology | Admitting: Cardiology

## 2019-06-27 ENCOUNTER — Other Ambulatory Visit: Payer: Self-pay

## 2019-06-27 VITALS — BP 150/72 | HR 58 | Temp 98.4°F | Ht 66.0 in | Wt 184.5 lb

## 2019-06-27 DIAGNOSIS — I2102 ST elevation (STEMI) myocardial infarction involving left anterior descending coronary artery: Secondary | ICD-10-CM | POA: Diagnosis not present

## 2019-06-27 DIAGNOSIS — Z955 Presence of coronary angioplasty implant and graft: Secondary | ICD-10-CM | POA: Insufficient documentation

## 2019-06-27 NOTE — Progress Notes (Signed)
Cardiac Individual Treatment Plan  Patient Details  Name: Rachel Kennedy MRN: [id number] Date of Birth: 04-23-1946 Referring Provider:     [place] from 06/27/2019 in [place]  Referring Provider  [place]       Initial Encounter Date:    CARDIAC REHAB PHASE II ORIENTATION from 06/27/2019 in [place]  Date  06/27/19      Visit Diagnosis: ST Elevation Myocardial Infarction Involving left anterior descending (LAD) coronary artery  Status post coronary artery stent placement  Patient's Home Medications on Admission:  Current Outpatient Medications:  .  acetaminophen (TYLENOL) 500 MG tablet, Take 500 mg by mouth every 6 (six) hours as needed for headache (pain)., Disp: , Rfl:  .  aspirin EC 81 MG tablet, Take 1 tablet (81 mg total) by mouth daily., Disp: , Rfl:  .  atorvastatin (LIPITOR) 80 MG tablet, TAKE 1 TABLET (80 MG TOTAL) BY MOUTH DAILY AT 6 PM., Disp: 90 tablet, Rfl: 0 .  buPROPion (WELLBUTRIN XL) 300 MG 24 hr tablet, Take 300 mg by mouth daily., Disp: , Rfl: 1 .  carvedilol (COREG) 6.25 MG tablet, TAKE 1 TABLET (6.25 MG TOTAL) BY MOUTH 2 (TWO) TIMES DAILY WITH A MEAL., Disp: 180 tablet, Rfl: 1 .  cycloSPORINE (RESTASIS) 0.05 % ophthalmic emulsion, Place 1 drop into both eyes 2 (two) times daily., Disp: , Rfl:  .  doxycycline (PERIOSTAT) 20 MG tablet, Take 20 mg by mouth 2 (two) times daily., Disp: , Rfl: 2 .  losartan (COZAAR) 25 MG tablet, TAKE 1 TABLET BY MOUTH EVERY DAY, Disp: 90 tablet, Rfl: 0 .  nitroGLYCERIN (NITROSTAT) 0.4 MG SL tablet, Place 1 tablet (0.4 mg total) under the tongue every 5 (five) minutes x 3 doses as needed for chest pain., Disp: 25 tablet, Rfl: 1 .  PRESCRIPTION MEDICATION, Apply 1 application topically daily as needed (arthritis pain). Arthritis cream compounded at [facility]   Diclofenac 3% Baclofen 2% gabapentin 5% Lidocaine 5% Menthol 1%, Disp: , Rfl:  .  sertraline  (ZOLOFT) 100 MG tablet, Take 100 mg by mouth at bedtime. , Disp: , Rfl:  .  ticagrelor (BRILINTA) 90 MG TABS tablet, Take 1 tablet (90 mg total) by mouth 2 (two) times daily., Disp: 180 tablet, Rfl: 2 .  zolpidem (AMBIEN) 10 MG tablet, Take 10 mg by mouth at bedtime as needed for sleep. , Disp: , Rfl:   Past Medical History: Past Medical History:  Diagnosis Date  . Acute systolic (congestive) heart failure ([place])   . CAD (coronary artery disease) 05/07/2019  . Depression 05/07/2019  . Hyperlipidemia   . Hypertension   . STEMI (ST elevation myocardial infarction) ([place])     Tobacco Use: Social History   Tobacco Use  Smoking Status Former Smoker  Smokeless Tobacco Never Used  Tobacco Comment   quit in 2017. Smoked for 30 years prior to that.     Labs: Recent Review Flowsheet Data    Labs for ITP Cardiac and Pulmonary Rehab Latest Ref Rng & Units 02/09/2019 02/10/2019 04/10/2019   Cholestrol 0 - 200 mg/dL - 194 133   LDLCALC 0 - 99 mg/dL - 126(H) 61   HDL >40 mg/dL - 37(L) 52   Trlycerides <150 mg/dL - 156(H) 100   Hemoglobin A1c 4.8 - 5.6 % 5.8(H) - -      Capillary Blood Glucose: No results found for: GLUCAP   Exercise Target Goals: Exercise Program Goal: Individual exercise prescription  set using results from initial 6 min walk test and THRR while considering  patient's activity barriers and safety.   Exercise Prescription Goal: Starting with aerobic activity 30 plus minutes a day, 3 days per week for initial exercise prescription. Provide home exercise prescription and guidelines that participant acknowledges understanding prior to discharge.  Activity Barriers & Risk Stratification: Activity Barriers & Cardiac Risk Stratification - 06/27/19 1406      Activity Barriers & Cardiac Risk Stratification   Cardiac Risk Stratification  High       6 Minute Walk: 6 Minute Walk    Row Name 06/27/19 1405         6 Minute Walk   Phase  Initial     Distance  1400 feet      Walk Time  6 minutes     # of Rest Breaks  0     MPH  2.6     METS  3     RPE  10     Perceived Dyspnea   10     VO2 Peak  10.01     Symptoms  No     Resting HR  58 bpm     Resting BP  150/72     Resting Oxygen Saturation   94 %     Exercise Oxygen Saturation  during 6 min walk  92 %     Max Ex. HR  83 bpm     Max Ex. BP  156/78     2 Minute Post BP  146/70        Oxygen Initial Assessment:   Oxygen Re-Evaluation:   Oxygen Discharge (Final Oxygen Re-Evaluation):   Initial Exercise Prescription: Initial Exercise Prescription - 06/27/19 1400      Date of Initial Exercise RX and Referring Provider   Date  06/27/19    Referring Provider  [place]     Expected Discharge Date  09/27/19      Treadmill   MPH  2    Grade  0    Minutes  17    METs  2.9      NuStep   Level  1    SPM  50    Minutes  17    METs  2      Prescription Details   Frequency (times per week)  3    Duration  Progress to 30 minutes of continuous aerobic without signs/symptoms of physical distress      Intensity   THRR 40-80% of Max Heartrate  36-54-72    Ratings of Perceived Exertion  11-13    Perceived Dyspnea  0-4      Progression   Progression  Continue to progress workloads to maintain intensity without signs/symptoms of physical distress.      Resistance Training   Training Prescription  Yes    Weight  1    Reps  10-15       Perform Capillary Blood Glucose checks as needed.  Exercise Prescription Changes:   Exercise Comments:   Exercise Goals and Review:  Exercise Goals    Row Name 06/27/19 1407             Exercise Goals   Increase Physical Activity  Yes       Intervention  Develop an individualized exercise prescription for aerobic and resistive training based on initial evaluation findings, risk stratification, comorbidities and participant's personal goals.       Expected Outcomes  Long Term:  Add in home exercise to make exercise part of routine and to increase  amount of physical activity.;Short Term: Attend rehab on a regular basis to increase amount of physical activity.;Long Term: Exercising regularly at least 3-5 days a week.       Increase Strength and Stamina  Yes       Intervention  Develop an individualized exercise prescription for aerobic and resistive training based on initial evaluation findings, risk stratification, comorbidities and participant's personal goals.       Expected Outcomes  Short Term: Increase workloads from initial exercise prescription for resistance, speed, and METs.;Short Term: Perform resistance training exercises routinely during rehab and add in resistance training at home;Long Term: Improve cardiorespiratory fitness, muscular endurance and strength as measured by increased METs and functional capacity ( )       Able to understand and use rate of perceived exertion (RPE) scale  Yes       Intervention  Provide education and explanation on how to use RPE scale       Expected Outcomes  Short Term: Able to use RPE daily in rehab to express subjective intensity level;Long Term:  Able to use RPE to guide intensity level when exercising independently       Knowledge and understanding of Target Heart Rate Range (THRR)  Yes       Intervention  Provide education and explanation of THRR including how the numbers were predicted and where they are located for reference       Expected Outcomes  Short Term: Able to state/look up THRR;Long Term: Able to use THRR to govern intensity when exercising independently;Short Term: Able to use daily as guideline for intensity in rehab       Able to check pulse independently  Yes       Intervention  Provide education and demonstration on how to check pulse in carotid and radial arteries.;Review the importance of being able to check your own pulse for safety during independent exercise       Expected Outcomes  Short Term: Able to explain why pulse checking is important during independent  exercise;Long Term: Able to check pulse independently and accurately       Understanding of Exercise Prescription  Yes       Intervention  Provide education, explanation, and written materials on patient's individual exercise prescription       Expected Outcomes  Short Term: Able to explain program exercise prescription;Long Term: Able to explain home exercise prescription to exercise independently          Exercise Goals Re-Evaluation :    Discharge Exercise Prescription (Final Exercise Prescription Changes):   Nutrition:  Target Goals: Understanding of nutrition guidelines, daily intake of sodium 1500mg , cholesterol 200mg , calories 30% from fat and 7% or less from saturated fats, daily to have 5 or more servings of fruits and vegetables.  Biometrics: Pre Biometrics - 06/27/19 1408      Pre Biometrics   Height   (1.676 m)    Weight  83.7 kg    Waist Circumference  37 inches    Hip Circumference  48 inches    Waist to Hip Ratio  0.77 %    BMI (Calculated)  29.8    Triceps Skinfold  14 mm    % Body Fat  37.4 %    Grip Strength  3.4 kg    Single Leg Stand  4 seconds        Nutrition Therapy Plan and Nutrition  Goals:   Nutrition Assessments: Nutrition Assessments - 06/27/19 1511      Rate Your Plate Scores   Pre Score  59       Nutrition Goals Re-Evaluation:   Nutrition Goals Discharge (Final Nutrition Goals Re-Evaluation):   Psychosocial: Target Goals: Acknowledge presence or absence of significant depression and/or stress, maximize coping skills, provide positive support system. Participant is able to verbalize types and ability to use techniques and skills needed for reducing stress and depression.  Initial Review & Psychosocial Screening: Initial Psych Review & Screening - 06/27/19 1455      Initial Review   Current issues with  None Identified      Family Dynamics   Good Support System?  Yes      Barriers   Psychosocial barriers to participate  in program  There are no identifiable barriers or psychosocial needs.      Screening Interventions   Interventions  Encouraged to exercise;Provide feedback about the scores to participant    Expected Outcomes  Long Term goal: The participant improves quality of Life and PHQ9 Scores as seen by post scores and/or verbalization of changes;Short Term goal: Identification and review with participant of any Quality of Life or Depression concerns found by scoring the questionnaire.       Quality of Life Scores: Quality of Life - 06/27/19 1409      Quality of Life   Select  Quality of Life      Quality of Life Scores   Health/Function Pre  22.37 %    Socioeconomic Pre  21.83 %    Psych/Spiritual Pre  20.36 %    Family Pre  19.5 %    GLOBAL Pre  21.47 %      Scores of 19 and below usually indicate a poorer quality of life in these areas.  A difference of  2-3 points is a clinically meaningful difference.  A difference of 2-3 points in the total score of the Quality of Life Index has been associated with significant improvement in overall quality of life, self-image, physical symptoms, and general health in studies assessing change in quality of life.  PHQ-9: Recent Review Flowsheet Data    Depression screen [facility] 06/27/2019   Decreased Interest 0   Down, Depressed, Hopeless 0   PHQ - 2 Score 0   Altered sleeping 1   Tired, decreased energy 1   Change in appetite 0   Feeling bad or failure about yourself  0   Trouble concentrating 0   Moving slowly or fidgety/restless 0   Suicidal thoughts 0   PHQ-9 Score 2   Difficult doing work/chores Not difficult at all     Interpretation of Total Score  Total Score Depression Severity:  1-4 = Minimal depression, 5-9 = Mild depression, 10-14 = Moderate depression, 15-19 = Moderately severe depression, 20-27 = Severe depression   Psychosocial Evaluation and Intervention: Psychosocial Evaluation - 06/27/19 1509      Psychosocial Evaluation &  Interventions   Interventions  Encouraged to exercise with the program and follow exercise prescription;Stress management education;Relaxation education    Comments  Patient's initial PHQ-9 score was 3 and her QOL score was 21.47 with no psychosocial issues identified.    Expected Outcomes  Patient will have no psychosocial issued identified at discharge.    Continue Psychosocial Services   No Follow up required       Psychosocial Re-Evaluation:   Psychosocial Discharge (Final Psychosocial Re-Evaluation):   Vocational Rehabilitation:  Provide vocational rehab assistance to qualifying candidates.   Vocational Rehab Evaluation & Intervention: Vocational Rehab - 06/27/19 1514      Initial Vocational Rehab Evaluation & Intervention   Assessment shows need for Vocational Rehabilitation  No      Vocational Rehab Re-Evaulation   Comments  Patient is a retired Engineer, siteschool teacher.       Education: Education Goals: Education classes will be provided on a weekly basis, covering required topics. Participant will state understanding/return demonstration of topics presented.  Learning Barriers/Preferences: Learning Barriers/Preferences - 06/27/19 1513      Learning Barriers/Preferences   Learning Barriers  None    Learning Preferences  Written Material;Skilled Demonstration       Education Topics: Hypertension, Hypertension Reduction -Define heart disease and high blood pressure. Discus how high blood pressure affects the body and ways to reduce high blood pressure.   Exercise and Your Heart -Discuss why it is important to exercise, the FITT principles of exercise, normal and abnormal responses to exercise, and how to exercise safely.   Angina -Discuss definition of angina, causes of angina, treatment of angina, and how to decrease risk of having angina.   Cardiac Medications -Review what the following cardiac medications are used for, how they affect the body, and side effects  that may occur when taking the medications.  Medications include Aspirin, Beta blockers, calcium channel blockers, ACE Inhibitors, angiotensin receptor blockers, diuretics, digoxin, and antihyperlipidemics.   Congestive Heart Failure -Discuss the definition of CHF, how to live with CHF, the signs and symptoms of CHF, and how keep track of weight and sodium intake.   Heart Disease and Intimacy -Discus the effect sexual activity has on the heart, how changes occur during intimacy as we age, and safety during sexual activity.   Smoking Cessation / COPD -Discuss different methods to quit smoking, the health benefits of quitting smoking, and the definition of COPD.   Nutrition I: Fats -Discuss the types of cholesterol, what cholesterol does to the heart, and how cholesterol levels can be controlled.   Nutrition II: Labels -Discuss the different components of food labels and how to read food label   Heart Parts/Heart Disease and PAD -Discuss the anatomy of the heart, the pathway of blood circulation through the heart, and these are affected by heart disease.   Stress I: Signs and Symptoms -Discuss the causes of stress, how stress may lead to anxiety and depression, and ways to limit stress.   Stress II: Relaxation -Discuss different types of relaxation techniques to limit stress.   Warning Signs of Stroke / TIA -Discuss definition of a stroke, what the signs and symptoms are of a stroke, and how to identify when someone is having stroke.   Knowledge Questionnaire Score: Knowledge Questionnaire Score - 06/27/19 1514      Knowledge Questionnaire Score   Pre Score  20/24       Core Components/Risk Factors/Patient Goals at Admission: Personal Goals and Risk Factors at Admission - 06/27/19 1514      Core Components/Risk Factors/Patient Goals on Admission    Weight Management  Obesity    Improve shortness of breath with ADL's  Yes    Intervention  Provide education,  individualized exercise plan and daily activity instruction to help decrease symptoms of SOB with activities of daily living.    Expected Outcomes  Short Term: Improve cardiorespiratory fitness to achieve a reduction of symptoms when performing ADLs    Personal Goal Other  Yes  Personal Goal  Get in better shape and get heart healthy. Learn about heart health and heart disease.    Intervention  Attend CR 3 days/week and supplement with exercise at home 2 days/week.    Expected Outcomes  Patient will meet her personal goals.       Core Components/Risk Factors/Patient Goals Review:    Core Components/Risk Factors/Patient Goals at Discharge (Final Review):    ITP Comments:   Comments: Patient arrived for 1st visit/orientation/education at 1315. Patient was referred to CR by Peter [place] due to ST elevation MI involving left anterior descending LAD coronary artery (I121.02) and Status Post coronary artery stent placement (Z95.5). During orientation advised patient on arrival and appointment times what to wear, what to do before, during and after exercise. Reviewed attendance and class policy. Talked about inclement weather and class consultation policy. Pt is scheduled to return Cardiac Rehab on 06/28/19 at 11:00. Pt was advised to come to class 15 minutes before class starts. Patient was also given instructions on meeting with the dietician and attending the Family Structure classes. Discussed RPE/Dpysnea scales. Discussed initial THR and how to find their radial and/or carotid pulse. Discussed the initial exercise prescription and how this effects their progress. Pt is eager to get started. Patient participated in warm up stretches followed by light weights and resistance bands. Patient was able to complete 6 minute walk test. Patient was measured for the equipment. Discussed equipment safety with patient. Took patient pre-anthropometric measurements. Patient finished visit at 15:00.

## 2019-06-27 NOTE — Progress Notes (Signed)
Cardiac/Pulmonary Rehab Medication Review by a Pharmacist  Does the patient  feel that his/her medications are working for him/her?  yes  Has the patient been experiencing any side effects to the medications prescribed?  yes -SOB but has gotten better  Does the patient measure his/her own blood pressure or blood glucose at home?  Not at the moment- owns a BP monitor if needed   Does the patient have any problems obtaining medications due to transportation or finances?   no  Understanding of regimen: good Understanding of indications: good Potential of compliance: excellent  Questions asked to Determine Patient Understanding of Medication Regimen:  1. What is the name of the medication?  2. What is the medication used for?  3. When should it be taken?  4. How much should be taken?  5. How will you take it?  6. What side effects should you report?  Understanding Defined as: Excellent: All questions above are correct Good: Questions 1-4 are correct Fair: Questions 1-2 are correct  Poor: 1 or none of the above questions are correct   Pharmacist comments: Overall, patient's medications recently updated by MD. States shortness of breath but has talked with the Dr and has improved.  Patient has no further questions.    Ramond Craver 06/27/2019 2:19 PM

## 2019-06-28 ENCOUNTER — Encounter (HOSPITAL_COMMUNITY)
Admission: RE | Admit: 2019-06-28 | Discharge: 2019-06-28 | Disposition: A | Discharge status: Home / Self Care | Payer: Medicare Other | Source: Ambulatory Visit | Attending: Cardiology | Admitting: Cardiology

## 2019-06-28 ENCOUNTER — Other Ambulatory Visit: Payer: Self-pay

## 2019-06-28 DIAGNOSIS — Z955 Presence of coronary angioplasty implant and graft: Secondary | ICD-10-CM

## 2019-06-28 DIAGNOSIS — I2102 ST elevation (STEMI) myocardial infarction involving left anterior descending coronary artery: Secondary | ICD-10-CM | POA: Diagnosis not present

## 2019-06-28 NOTE — Progress Notes (Signed)
Daily Session Note  Patient Details  Name: Rachel Kennedy MRN: [id number] Date of Birth: 04/29/1946 Referring Provider:     [place] from 06/27/2019 in [place]  Referring Provider  [place]       Encounter Date: 06/28/2019  Check In: Session Check In - 06/28/19 1100      Check-In   Supervising physician immediately available to respond to emergencies  See telemetry face sheet for immediately available MD    Location  [facility]    Staff Present  Russella Dar, MS, EP, [facility], Exercise Physiologist;Amanda Ballard, Exercise Physiologist;Zachory Mangual Wynetta Emery, RN, BSN    Virtual Visit  No    Medication changes reported      No    Fall or balance concerns reported     No    Tobacco Cessation  No Change    Warm-up and Cool-down  Performed as group-led instruction    Resistance Training Performed  No    VAD Patient?  No    PAD/SET Patient?  No      Pain Assessment   Currently in Pain?  No/denies    Pain Score  0-No pain    Multiple Pain Sites  No       Capillary Blood Glucose: No results found for this or any previous visit (from the past 24 hour(s)).    Social History   Tobacco Use  Smoking Status Former Smoker  Smokeless Tobacco Never Used  Tobacco Comment   quit in 2017. Smoked for 30 years prior to that.     Goals Met:  Independence with exercise equipment Exercise tolerated well No report of cardiac concerns or symptoms Strength training completed today  Goals Unmet:  Not Applicable  Comments: Pt able to follow exercise prescription today without complaint.  Will continue to monitor for progression. Check out 1200.   Dr. Kate Sable is Medical Director for [facility] Cardiac and Pulmonary Rehab.

## 2019-06-30 ENCOUNTER — Other Ambulatory Visit: Payer: Self-pay

## 2019-06-30 ENCOUNTER — Encounter (HOSPITAL_COMMUNITY)
Admission: RE | Admit: 2019-06-30 | Discharge: 2019-06-30 | Disposition: A | Discharge status: Home / Self Care | Payer: Medicare Other | Source: Ambulatory Visit | Attending: Cardiology | Admitting: Cardiology

## 2019-06-30 DIAGNOSIS — I2102 ST elevation (STEMI) myocardial infarction involving left anterior descending coronary artery: Secondary | ICD-10-CM | POA: Diagnosis not present

## 2019-06-30 DIAGNOSIS — Z955 Presence of coronary angioplasty implant and graft: Secondary | ICD-10-CM

## 2019-06-30 NOTE — Progress Notes (Signed)
Daily Session Note  Patient Details  Name: LAKINA MCINTIRE MRN: [id number] Date of Birth: 28-Dec-1945 Referring Provider:     [place] from 06/27/2019 in [place]  Referring Provider  [place]       Encounter Date: 06/30/2019  Check In: Session Check In - 06/30/19 1100      Check-In   Supervising physician immediately available to respond to emergencies  See telemetry face sheet for immediately available MD    Location  [facility]    Staff Present  Russella Dar, MS, EP, [facility], Exercise Physiologist;Amanda Ballard, Exercise Physiologist;Tranae Laramie Wynetta Emery, RN, BSN    Virtual Visit  No    Medication changes reported      No    Fall or balance concerns reported     No    Tobacco Cessation  No Change    Warm-up and Cool-down  Performed as group-led instruction    Resistance Training Performed  Yes    VAD Patient?  No    PAD/SET Patient?  No      Pain Assessment   Currently in Pain?  No/denies    Pain Score  0-No pain    Multiple Pain Sites  No       Capillary Blood Glucose: No results found for this or any previous visit (from the past 24 hour(s)).    Social History   Tobacco Use  Smoking Status Former Smoker  Smokeless Tobacco Never Used  Tobacco Comment   quit in 2017. Smoked for 30 years prior to that.     Goals Met:  Independence with exercise equipment Exercise tolerated well No report of cardiac concerns or symptoms Strength training completed today  Goals Unmet:  Not Applicable  Comments: Pt able to follow exercise prescription today without complaint.  Will continue to monitor for progression. Check out 1200.   Dr. Kate Sable is Medical Director for [facility] Cardiac and Pulmonary Rehab.

## 2019-07-03 ENCOUNTER — Encounter (HOSPITAL_COMMUNITY)
Admission: RE | Admit: 2019-07-03 | Discharge: 2019-07-03 | Disposition: A | Discharge status: Home / Self Care | Payer: Medicare Other | Source: Ambulatory Visit | Attending: Cardiology | Admitting: Cardiology

## 2019-07-03 ENCOUNTER — Other Ambulatory Visit: Payer: Self-pay

## 2019-07-03 DIAGNOSIS — Z955 Presence of coronary angioplasty implant and graft: Secondary | ICD-10-CM

## 2019-07-03 DIAGNOSIS — I2102 ST elevation (STEMI) myocardial infarction involving left anterior descending coronary artery: Secondary | ICD-10-CM

## 2019-07-03 NOTE — Progress Notes (Signed)
Daily Session Note  Patient Details  Name: Rachel Kennedy MRN: [id number] Date of Birth: 03/09/1946 Referring Provider:     [place] from 06/27/2019 in [place]  Referring Provider  [place]       Encounter Date: 07/03/2019  Check In: Session Check In - 07/03/19 1100      Check-In   Supervising physician immediately available to respond to emergencies  See telemetry face sheet for immediately available MD    Location  [facility]    Staff Present  Benay Pike, Exercise Physiologist;Debra Wynetta Emery, RN, BSN    Virtual Visit  No    Medication changes reported      No    Fall or balance concerns reported     No    Tobacco Cessation  No Change    Warm-up and Cool-down  Performed as group-led instruction    Resistance Training Performed  Yes    VAD Patient?  No    PAD/SET Patient?  No      Pain Assessment   Currently in Pain?  No/denies    Pain Score  0-No pain    Multiple Pain Sites  No       Capillary Blood Glucose: No results found for this or any previous visit (from the past 24 hour(s)).    Social History   Tobacco Use  Smoking Status Former Smoker  Smokeless Tobacco Never Used  Tobacco Comment   quit in 2017. Smoked for 30 years prior to that.     Goals Met:  Independence with exercise equipment Exercise tolerated well No report of cardiac concerns or symptoms Strength training completed today  Goals Unmet:  Not Applicable  Comments: Pt able to follow exercise prescription today without complaint.  Will continue to monitor for progression. Check out 1200.   Dr. Kate Sable is Medical Director for [facility] Cardiac and Pulmonary Rehab.

## 2019-07-05 ENCOUNTER — Encounter (HOSPITAL_COMMUNITY)
Admission: RE | Admit: 2019-07-05 | Discharge: 2019-07-05 | Disposition: A | Discharge status: Home / Self Care | Payer: Medicare Other | Source: Ambulatory Visit | Attending: Cardiology | Admitting: Cardiology

## 2019-07-05 ENCOUNTER — Other Ambulatory Visit: Payer: Self-pay

## 2019-07-05 DIAGNOSIS — I2102 ST elevation (STEMI) myocardial infarction involving left anterior descending coronary artery: Secondary | ICD-10-CM

## 2019-07-05 DIAGNOSIS — Z955 Presence of coronary angioplasty implant and graft: Secondary | ICD-10-CM

## 2019-07-05 NOTE — Progress Notes (Signed)
Daily Session Note  Patient Details  Name: Rachel Kennedy MRN: [id number] Date of Birth: 1946/04/20 Referring Provider:     [place] from 06/27/2019 in [place]  Referring Provider  [place]       Encounter Date: 07/05/2019  Check In: Session Check In - 07/05/19 1100      Check-In   Supervising physician immediately available to respond to emergencies  See telemetry face sheet for immediately available MD    Location  [facility]    Staff Present  Benay Pike, Exercise Physiologist;Mitchelle Goerner Wynetta Emery, RN, BSN;Diane Coad, MS, EP, [facility], Exercise Physiologist    Virtual Visit  No    Medication changes reported      No    Fall or balance concerns reported     No    Tobacco Cessation  No Change    Warm-up and Cool-down  Performed as group-led instruction    Resistance Training Performed  Yes    VAD Patient?  No    PAD/SET Patient?  No      Pain Assessment   Currently in Pain?  No/denies    Pain Score  0-No pain    Multiple Pain Sites  No       Capillary Blood Glucose: No results found for this or any previous visit (from the past 24 hour(s)).    Social History   Tobacco Use  Smoking Status Former Smoker  Smokeless Tobacco Never Used  Tobacco Comment   quit in 2017. Smoked for 30 years prior to that.     Goals Met:  Independence with exercise equipment Exercise tolerated well No report of cardiac concerns or symptoms Strength training completed today  Goals Unmet:  Not Applicable  Comments: Pt able to follow exercise prescription today without complaint.  Will continue to monitor for progression. Check out 1200.   Dr. Kate Sable is Medical Director for [facility] Cardiac and Pulmonary Rehab.

## 2019-07-06 ENCOUNTER — Encounter (HOSPITAL_COMMUNITY): Payer: Medicare Other

## 2019-07-07 ENCOUNTER — Other Ambulatory Visit: Payer: Self-pay

## 2019-07-07 ENCOUNTER — Encounter (HOSPITAL_COMMUNITY)
Admission: RE | Admit: 2019-07-07 | Discharge: 2019-07-07 | Disposition: A | Discharge status: Home / Self Care | Payer: Medicare Other | Source: Ambulatory Visit | Attending: Cardiology | Admitting: Cardiology

## 2019-07-07 DIAGNOSIS — I2102 ST elevation (STEMI) myocardial infarction involving left anterior descending coronary artery: Secondary | ICD-10-CM | POA: Diagnosis not present

## 2019-07-07 DIAGNOSIS — Z955 Presence of coronary angioplasty implant and graft: Secondary | ICD-10-CM

## 2019-07-07 NOTE — Progress Notes (Signed)
Daily Session Note  Patient Details  Name: AMANEE IACOVELLI MRN: [id number] Date of Birth: 1946/06/15 Referring Provider:     [place] from 06/27/2019 in [place]  Referring Provider  [place]       Encounter Date: 07/07/2019  Check In: Session Check In - 07/07/19 1116      Check-In   Supervising physician immediately available to respond to emergencies  See telemetry face sheet for immediately available MD    Location  [facility]    Staff Present  Russella Dar, MS, EP, [facility], Exercise Physiologist;Debra Wynetta Emery, RN, Cory Munch, Exercise Physiologist    Virtual Visit  No    Medication changes reported      No    Fall or balance concerns reported     No    Tobacco Cessation  No Change    Warm-up and Cool-down  Performed as group-led instruction    Resistance Training Performed  Yes    VAD Patient?  No    PAD/SET Patient?  No      Pain Assessment   Pain Score  0-No pain    Multiple Pain Sites  No       Capillary Blood Glucose: No results found for this or any previous visit (from the past 24 hour(s)).    Social History   Tobacco Use  Smoking Status Former Smoker  Smokeless Tobacco Never Used  Tobacco Comment   quit in 2017. Smoked for 30 years prior to that.     Goals Met:  Independence with exercise equipment Exercise tolerated well Personal goals reviewed No report of cardiac concerns or symptoms Strength training completed today  Goals Unmet:  Not Applicable  Comments: Check out: 1200   Dr. Kate Sable is Medical Director for [facility] and Pulmonary Rehab.

## 2019-07-10 ENCOUNTER — Other Ambulatory Visit: Payer: Self-pay

## 2019-07-10 ENCOUNTER — Encounter (HOSPITAL_COMMUNITY)
Admission: RE | Admit: 2019-07-10 | Discharge: 2019-07-10 | Disposition: A | Discharge status: Home / Self Care | Payer: Medicare Other | Source: Ambulatory Visit | Attending: Cardiology | Admitting: Cardiology

## 2019-07-10 DIAGNOSIS — Z955 Presence of coronary angioplasty implant and graft: Secondary | ICD-10-CM

## 2019-07-10 DIAGNOSIS — I2102 ST elevation (STEMI) myocardial infarction involving left anterior descending coronary artery: Secondary | ICD-10-CM | POA: Diagnosis not present

## 2019-07-10 NOTE — Progress Notes (Signed)
Cardiac Individual Treatment Plan  Patient Details  Name: Rachel Kennedy MRN: [id number] Date of Birth: 1946-12-02 Referring Provider:     CARDIAC REHAB PHASE II ORIENTATION from 06/27/2019 in [place] [facility]  Referring Provider  [place]       Initial Encounter Date:    CARDIAC REHAB PHASE II ORIENTATION from 06/27/2019 in [place] [facility]  Date  06/27/19      Visit Diagnosis: ST elevation myocardial infarction involving left anterior descending (LAD) coronary artery [facility])   Status post coronary artery stent placement   Patient's Home Medications on Admission:  Current Outpatient Medications:  .  acetaminophen (TYLENOL) 500 MG tablet, Take 500 mg by mouth every 6 (six) hours as needed for headache (pain)., Disp: , Rfl:  .  aspirin EC 81 MG tablet, Take 1 tablet (81 mg total) by mouth daily., Disp: , Rfl:  .  atorvastatin (LIPITOR) 80 MG tablet, TAKE 1 TABLET (80 MG TOTAL) BY MOUTH DAILY AT 6 PM., Disp: 90 tablet, Rfl: 0 .  buPROPion (WELLBUTRIN XL) 300 MG 24 hr tablet, Take 300 mg by mouth daily., Disp: , Rfl: 1 .  carvedilol (COREG) 6.25 MG tablet, TAKE 1 TABLET (6.25 MG TOTAL) BY MOUTH 2 (TWO) TIMES DAILY WITH A MEAL., Disp: 180 tablet, Rfl: 1 .  cycloSPORINE (RESTASIS) 0.05 % ophthalmic emulsion, Place 1 drop into both eyes 2 (two) times daily., Disp: , Rfl:  .  doxycycline (PERIOSTAT) 20 MG tablet, Take 20 mg by mouth 2 (two) times daily., Disp: , Rfl: 2 .  losartan (COZAAR) 25 MG tablet, TAKE 1 TABLET BY MOUTH EVERY DAY, Disp: 90 tablet, Rfl: 0 .  nitroGLYCERIN (NITROSTAT) 0.4 MG SL tablet, Place 1 tablet (0.4 mg total) under the tongue every 5 (five) minutes x 3 doses as needed for chest pain., Disp: 25 tablet, Rfl: 1 .  PRESCRIPTION MEDICATION, Apply 1 application topically daily as needed (arthritis pain). Arthritis cream compounded at [facility]   Diclofenac 3% Baclofen 2% gabapentin 5% Lidocaine 5% Menthol 1%, Disp: , Rfl:  .   sertraline (ZOLOFT) 100 MG tablet, Take 100 mg by mouth at bedtime. , Disp: , Rfl:  .  ticagrelor (BRILINTA) 90 MG TABS tablet, Take 1 tablet (90 mg total) by mouth 2 (two) times daily., Disp: 180 tablet, Rfl: 2 .  zolpidem (AMBIEN) 10 MG tablet, Take 10 mg by mouth at bedtime as needed for sleep. , Disp: , Rfl:   Past Medical History: Past Medical History:  Diagnosis Date  . Acute systolic (congestive) heart failure (HCC)   . CAD (coronary artery disease) 05/07/2019  . Depression 05/07/2019  . Hyperlipidemia   . Hypertension   . STEMI (ST elevation myocardial infarction) (HCC)     Tobacco Use: Social History   Tobacco Use  Smoking Status Former Smoker  Smokeless Tobacco Never Used  Tobacco Comment   quit in 2017. Smoked for 30 years prior to that.     Labs: Recent Review Flowsheet Data    Labs for ITP Cardiac and Pulmonary Rehab Latest Ref Rng & Units 02/09/2019 02/10/2019 04/10/2019   Cholestrol 0 - 200 mg/dL - 409 811   LDLCALC 0 - 99 mg/dL - 914(N) 61   HDL >82 mg/dL - 95(A) 52   Trlycerides <150 mg/dL - 213(Y) 865   Hemoglobin A1c 4.8 - 5.6 % 5.8(H) - -      Capillary Blood Glucose: No results found for: GLUCAP   Exercise Target Goals: Exercise Program Goal:  Individual exercise prescription set using results from initial 6 min walk test and THRR while considering  patient's activity barriers and safety.   Exercise Prescription Goal: Starting with aerobic activity 30 plus minutes a day, 3 days per week for initial exercise prescription. Provide home exercise prescription and guidelines that participant acknowledges understanding prior to discharge.  Activity Barriers & Risk Stratification: Activity Barriers & Cardiac Risk Stratification - 06/27/19 1406      Activity Barriers & Cardiac Risk Stratification   Cardiac Risk Stratification  High       6 Minute Walk: 6 Minute Walk    Row Name 06/27/19 1405         6 Minute Walk   Phase  Initial     Distance   1400 feet     Walk Time  6 minutes     # of Rest Breaks  0     MPH  2.6     METS  3     RPE  10     Perceived Dyspnea   10     VO2 Peak  10.01     Symptoms  No     Resting HR  58 bpm     Resting BP  150/72     Resting Oxygen Saturation   94 %     Exercise Oxygen Saturation  during 6 min walk  92 %     Max Ex. HR  83 bpm     Max Ex. BP  156/78     2 Minute Post BP  146/70        Oxygen Initial Assessment:   Oxygen Re-Evaluation:   Oxygen Discharge (Final Oxygen Re-Evaluation):   Initial Exercise Prescription: Initial Exercise Prescription - 06/27/19 1400      Date of Initial Exercise RX and Referring Provider   Date  06/27/19    Referring Provider  [place]     Expected Discharge Date  09/27/19      Treadmill   MPH  2    Grade  0    Minutes  17    METs  2.9      NuStep   Level  1    SPM  50    Minutes  17    METs  2      Prescription Details   Frequency (times per week)  3    Duration  Progress to 30 minutes of continuous aerobic without signs/symptoms of physical distress      Intensity   THRR 40-80% of Max Heartrate  36-54-72    Ratings of Perceived Exertion  11-13    Perceived Dyspnea  0-4      Progression   Progression  Continue to progress workloads to maintain intensity without signs/symptoms of physical distress.      Resistance Training   Training Prescription  Yes    Weight  1    Reps  10-15       Perform Capillary Blood Glucose checks as needed.  Exercise Prescription Changes:   Exercise Comments:  Exercise Comments    Row Name 07/10/19 1320           Exercise Comments  Pt. is still new to the program, she has attended 7 sessions. She is working on the [employer] and [employer]. She has tolerated the exercise well and already started to increase her workloads. We will progress her as needed.          Exercise Goals and  Review:  Exercise Goals    Row Name 06/27/19 1407             Exercise Goals   Increase  Physical Activity  Yes       Intervention  Develop an individualized exercise prescription for aerobic and resistive training based on initial evaluation findings, risk stratification, comorbidities and participant's personal goals.       Expected Outcomes  Long Term: Add in home exercise to make exercise part of routine and to increase amount of physical activity.;Short Term: Attend rehab on a regular basis to increase amount of physical activity.;Long Term: Exercising regularly at least 3-5 days a week.       Increase Strength and Stamina  Yes       Intervention  Develop an individualized exercise prescription for aerobic and resistive training based on initial evaluation findings, risk stratification, comorbidities and participant's personal goals.       Expected Outcomes  Short Term: Increase workloads from initial exercise prescription for resistance, speed, and METs.;Short Term: Perform resistance training exercises routinely during rehab and add in resistance training at home;Long Term: Improve cardiorespiratory fitness, muscular endurance and strength as measured by increased METs and functional capacity (6MWT)       Able to understand and use rate of perceived exertion (RPE) scale  Yes       Intervention  Provide education and explanation on how to use RPE scale       Expected Outcomes  Short Term: Able to use RPE daily in rehab to express subjective intensity level;Long Term:  Able to use RPE to guide intensity level when exercising independently       Knowledge and understanding of Target Heart Rate Range (THRR)  Yes       Intervention  Provide education and explanation of THRR including how the numbers were predicted and where they are located for reference       Expected Outcomes  Short Term: Able to state/look up THRR;Long Term: Able to use THRR to govern intensity when exercising independently;Short Term: Able to use daily as guideline for intensity in rehab       Able to check pulse  independently  Yes       Intervention  Provide education and demonstration on how to check pulse in carotid and radial arteries.;Review the importance of being able to check your own pulse for safety during independent exercise       Expected Outcomes  Short Term: Able to explain why pulse checking is important during independent exercise;Long Term: Able to check pulse independently and accurately       Understanding of Exercise Prescription  Yes       Intervention  Provide education, explanation, and written materials on patient's individual exercise prescription       Expected Outcomes  Short Term: Able to explain program exercise prescription;Long Term: Able to explain home exercise prescription to exercise independently          Exercise Goals Re-Evaluation : Exercise Goals Re-Evaluation    Row Name 07/10/19 1317             Exercise Goal Re-Evaluation   Exercise Goals Review  Increase Physical Activity;Increase Strength and Stamina;Able to understand and use rate of perceived exertion (RPE) scale;Knowledge and understanding of Target Heart Rate Range (THRR);Able to check pulse independently;Understanding of Exercise Prescription       Comments  Pt. is new to the program. She has attended regularly and says this has  helped her to get back to routine activity.       Expected Outcomes  Short: increase overall activity levels. Long: increase overall heart health.           Discharge Exercise Prescription (Final Exercise Prescription Changes):   Nutrition:  Target Goals: Understanding of nutrition guidelines, daily intake of sodium 1500mg , cholesterol 200mg , calories 30% from fat and 7% or less from saturated fats, daily to have 5 or more servings of fruits and vegetables.  Biometrics: Pre Biometrics - 06/27/19 1408      Pre Biometrics   Height   (1.676 m)    Weight  83.7 kg    Waist Circumference  37 inches    Hip Circumference  48 inches    Waist to Hip Ratio  0.77 %     BMI (Calculated)  29.8    Triceps Skinfold  14 mm    % Body Fat  37.4 %    Grip Strength  3.4 kg    Single Leg Stand  4 seconds        Nutrition Therapy Plan and Nutrition Goals: Nutrition Therapy & Goals - 07/10/19 1503      Nutrition Therapy   RD appointment deferred  Yes       Nutrition Assessments: Nutrition Assessments - 06/27/19 1511      Rate Your Plate Scores   Pre Score  59       Nutrition Goals Re-Evaluation:   Nutrition Goals Discharge (Final Nutrition Goals Re-Evaluation):   Psychosocial: Target Goals: Acknowledge presence or absence of significant depression and/or stress, maximize coping skills, provide positive support system. Participant is able to verbalize types and ability to use techniques and skills needed for reducing stress and depression.  Initial Review & Psychosocial Screening: Initial Psych Review & Screening - 06/27/19 1455      Initial Review   Current issues with  None Identified      Family Dynamics   Good Support System?  Yes      Barriers   Psychosocial barriers to participate in program  There are no identifiable barriers or psychosocial needs.      Screening Interventions   Interventions  Encouraged to exercise;Provide feedback about the scores to participant    Expected Outcomes  Long Term goal: The participant improves quality of Life and PHQ9 Scores as seen by post scores and/or verbalization of changes;Short Term goal: Identification and review with participant of any Quality of Life or Depression concerns found by scoring the questionnaire.       Quality of Life Scores: Quality of Life - 06/27/19 1409      Quality of Life   Select  Quality of Life      Quality of Life Scores   Health/Function Pre  22.37 %    Socioeconomic Pre  21.83 %    Psych/Spiritual Pre  20.36 %    Family Pre  19.5 %    GLOBAL Pre  21.47 %      Scores of 19 and below usually indicate a poorer quality of life in these areas.  A difference  of  2-3 points is a clinically meaningful difference.  A difference of 2-3 points in the total score of the Quality of Life Index has been associated with significant improvement in overall quality of life, self-image, physical symptoms, and general health in studies assessing change in quality of life.  PHQ-9: Recent Review Flowsheet Data    Depression screen [facility] 2/9 06/27/2019  Decreased Interest 0   Down, Depressed, Hopeless 0   PHQ - 2 Score 0   Altered sleeping 1   Tired, decreased energy 1   Change in appetite 0   Feeling bad or failure about yourself  0   Trouble concentrating 0   Moving slowly or fidgety/restless 0   Suicidal thoughts 0   PHQ-9 Score 2   Difficult doing work/chores Not difficult at all     Interpretation of Total Score  Total Score Depression Severity:  1-4 = Minimal depression, 5-9 = Mild depression, 10-14 = Moderate depression, 15-19 = Moderately severe depression, 20-27 = Severe depression   Psychosocial Evaluation and Intervention: Psychosocial Evaluation - 06/27/19 1509      Psychosocial Evaluation & Interventions   Interventions  Encouraged to exercise with the program and follow exercise prescription;Stress management education;Relaxation education    Comments  Patient's initial PHQ-9 score was 3 and her QOL score was 21.47 with no psychosocial issues identified.    Expected Outcomes  Patient will have no psychosocial issued identified at discharge.    Continue Psychosocial Services   No Follow up required       Psychosocial Re-Evaluation: Psychosocial Re-Evaluation    Row Name 07/10/19 1506             Psychosocial Re-Evaluation   Current issues with  None Identified       Comments  Patient's initial QOL score was 21.47 and her PHQ-9 score was 2 with no psychosocial issues identified. Will continue to monitor for progress.       Expected Outcomes  Patient will have no psychosocial issues identified at discharge.       Interventions  Stress  management education;Encouraged to attend Cardiac Rehabilitation for the exercise;Relaxation education       Continue Psychosocial Services   No Follow up required          Psychosocial Discharge (Final Psychosocial Re-Evaluation): Psychosocial Re-Evaluation - 07/10/19 1506      Psychosocial Re-Evaluation   Current issues with  None Identified    Comments  Patient's initial QOL score was 21.47 and her PHQ-9 score was 2 with no psychosocial issues identified. Will continue to monitor for progress.    Expected Outcomes  Patient will have no psychosocial issues identified at discharge.    Interventions  Stress management education;Encouraged to attend Cardiac Rehabilitation for the exercise;Relaxation education    Continue Psychosocial Services   No Follow up required       Vocational Rehabilitation: Provide vocational rehab assistance to qualifying candidates.   Vocational Rehab Evaluation & Intervention: Vocational Rehab - 06/27/19 1514      Initial Vocational Rehab Evaluation & Intervention   Assessment shows need for Vocational Rehabilitation  No      Vocational Rehab Re-Evaulation   Comments  Patient is a retired Engineer, site.       Education: Education Goals: Education classes will be provided on a weekly basis, covering required topics. Participant will state understanding/return demonstration of topics presented.  Learning Barriers/Preferences: Learning Barriers/Preferences - 06/27/19 1513      Learning Barriers/Preferences   Learning Barriers  None    Learning Preferences  Written Material;Skilled Demonstration       Education Topics: Hypertension, Hypertension Reduction -Define heart disease and high blood pressure. Discus how high blood pressure affects the body and ways to reduce high blood pressure.   Exercise and Your Heart -Discuss why it is important to exercise, the FITT principles of exercise, normal  and abnormal responses to exercise, and how to  exercise safely.   [facility]  Date  06/28/19  Educator  D. Coad  Instruction Review Code  2- Demonstrated Understanding      Angina -Discuss definition of angina, causes of angina, treatment of angina, and how to decrease risk of having angina.   CARDIAC REHAB PHASE II EXERCISE from 07/05/2019 in [place] [facility]  Date  07/05/19  Educator  Timoteo Expose. Kalyna Paolella  Instruction Review Code  2- Demonstrated Understanding      Cardiac Medications -Review what the following cardiac medications are used for, how they affect the body, and side effects that may occur when taking the medications.  Medications include Aspirin, Beta blockers, calcium channel blockers, ACE Inhibitors, angiotensin receptor blockers, diuretics, digoxin, and antihyperlipidemics.   Congestive Heart Failure -Discuss the definition of CHF, how to live with CHF, the signs and symptoms of CHF, and how keep track of weight and sodium intake.   Heart Disease and Intimacy -Discus the effect sexual activity has on the heart, how changes occur during intimacy as we age, and safety during sexual activity.   Smoking Cessation / COPD -Discuss different methods to quit smoking, the health benefits of quitting smoking, and the definition of COPD.   Nutrition I: Fats -Discuss the types of cholesterol, what cholesterol does to the heart, and how cholesterol levels can be controlled.   Nutrition II: Labels -Discuss the different components of food labels and how to read food label   Heart Parts/Heart Disease and PAD -Discuss the anatomy of the heart, the pathway of blood circulation through the heart, and these are affected by heart disease.   Stress I: Signs and Symptoms -Discuss the causes of stress, how stress may lead to anxiety and depression, and ways to limit stress.   Stress II: Relaxation -Discuss different types of relaxation  techniques to limit stress.   Warning Signs of Stroke / TIA -Discuss definition of a stroke, what the signs and symptoms are of a stroke, and how to identify when someone is having stroke.   Knowledge Questionnaire Score: Knowledge Questionnaire Score - 06/27/19 1514      Knowledge Questionnaire Score   Pre Score  20/24       Core Components/Risk Factors/Patient Goals at Admission: Personal Goals and Risk Factors at Admission - 06/27/19 1514      Core Components/Risk Factors/Patient Goals on Admission    Weight Management  Obesity    Improve shortness of breath with ADL's  Yes    Intervention  Provide education, individualized exercise plan and daily activity instruction to help decrease symptoms of SOB with activities of daily living.    Expected Outcomes  Short Term: Improve cardiorespiratory fitness to achieve a reduction of symptoms when performing ADLs    Personal Goal Other  Yes    Personal Goal  Get in better shape and get heart healthy. Learn about heart health and heart disease.    Intervention  Attend CR 3 days/week and supplement with exercise at home 2 days/week.    Expected Outcomes  Patient will meet her personal goals.       Core Components/Risk Factors/Patient Goals Review:  Goals and Risk Factor Review    Row Name 07/10/19 1504             Core Components/Risk Factors/Patient Goals Review   Personal Goals Review  Weight Management/Obesity;Improve shortness of breath with ADL's;Other  Get in shape and get heart healthy.       Review  Patient has completed 7 sessions maintaining her weight since she started the program. She says her muscles feels sore the next day which she feels is a sign she is working hard and the program is benefiting her. She hopes that as she continues she will get in shape and heart healthy. Will continue to monitor for progress.       Expected Outcomes  Patient will continue to attend sessions and complete the program meeting her  personal goals.          Core Components/Risk Factors/Patient Goals at Discharge (Final Review):  Goals and Risk Factor Review - 07/10/19 1504      Core Components/Risk Factors/Patient Goals Review   Personal Goals Review  Weight Management/Obesity;Improve shortness of breath with ADL's;Other   Get in shape and get heart healthy.   Review  Patient has completed 7 sessions maintaining her weight since she started the program. She says her muscles feels sore the next day which she feels is a sign she is working hard and the program is benefiting her. She hopes that as she continues she will get in shape and heart healthy. Will continue to monitor for progress.    Expected Outcomes  Patient will continue to attend sessions and complete the program meeting her personal goals.       ITP Comments:   Comments: ITP REVIEW Patient doing well in the program. Will continue to monitor for progress.

## 2019-07-10 NOTE — Progress Notes (Signed)
Daily Session Note  Patient Details  Name: Rachel Kennedy MRN: [id number] Date of Birth: [birth year] Referring Provider:     [place] from 06/27/2019 in [place]  Referring Provider  [place]       Encounter Date: 07/10/2019  Check In: Session Check In - 07/10/19 1140      Check-In   Supervising physician immediately available to respond to emergencies  See telemetry face sheet for immediately available MD    Location  [facility]    Staff Present  Russella Dar, MS, EP, [facility], Exercise Physiologist;Debra Wynetta Emery, RN, Cory Munch, Exercise Physiologist    Virtual Visit  No    Medication changes reported      No    Fall or balance concerns reported     No    Tobacco Cessation  No Change    Warm-up and Cool-down  Performed as group-led instruction    Resistance Training Performed  Yes    VAD Patient?  No    PAD/SET Patient?  No      Pain Assessment   Currently in Pain?  No/denies    Pain Score  0-No pain    Multiple Pain Sites  No       Capillary Blood Glucose: No results found for this or any previous visit (from the past 24 hour(s)).    Social History   Tobacco Use  Smoking Status Former Smoker  Smokeless Tobacco Never Used  Tobacco Comment   quit in 2017. Smoked for 30 years prior to that.     Goals Met:  Independence with exercise equipment Exercise tolerated well No report of cardiac concerns or symptoms Strength training completed today  Goals Unmet:  Not Applicable  Comments: Check out: 1200   Dr. Kate Sable is Medical Director for [place] and Pulmonary Rehab.

## 2019-07-12 ENCOUNTER — Other Ambulatory Visit: Payer: Self-pay

## 2019-07-12 ENCOUNTER — Encounter (HOSPITAL_COMMUNITY)
Admission: RE | Admit: 2019-07-12 | Discharge: 2019-07-12 | Disposition: A | Discharge status: Home / Self Care | Payer: Medicare Other | Source: Ambulatory Visit | Attending: Cardiology | Admitting: Cardiology

## 2019-07-12 DIAGNOSIS — I2102 ST elevation (STEMI) myocardial infarction involving left anterior descending coronary artery: Secondary | ICD-10-CM | POA: Diagnosis not present

## 2019-07-12 DIAGNOSIS — Z955 Presence of coronary angioplasty implant and graft: Secondary | ICD-10-CM

## 2019-07-12 NOTE — Progress Notes (Signed)
Daily Session Note  Patient Details  Name: Rachel Kennedy MRN: [id number] Date of Birth: 1946-08-29 Referring Provider:     [place] from 06/27/2019 in [place]  Referring Provider  [place]       Encounter Date: 07/12/2019  Check In: Session Check In - 07/12/19 1100      Check-In   Supervising physician immediately available to respond to emergencies  See telemetry face sheet for immediately available MD    Location  [facility]    Staff Present  Russella Dar, MS, EP, [facility], Exercise Physiologist;Courtne Lighty Wynetta Emery, RN, Cory Munch, Exercise Physiologist    Virtual Visit  No    Medication changes reported      No    Fall or balance concerns reported     No    Tobacco Cessation  No Change    Warm-up and Cool-down  Performed as group-led instruction    Resistance Training Performed  Yes    VAD Patient?  No    PAD/SET Patient?  No      Pain Assessment   Currently in Pain?  No/denies    Pain Score  0-No pain    Multiple Pain Sites  No       Capillary Blood Glucose: No results found for this or any previous visit (from the past 24 hour(s)).    Social History   Tobacco Use  Smoking Status Former Smoker  Smokeless Tobacco Never Used  Tobacco Comment   quit in 2017. Smoked for 30 years prior to that.     Goals Met:  Independence with exercise equipment Exercise tolerated well No report of cardiac concerns or symptoms Strength training completed today  Goals Unmet:  Not Applicable  Comments: Pt able to follow exercise prescription today without complaint.  Will continue to monitor for progression. Check out 1200.   Dr. Kate Sable is Medical Director for [facility] Cardiac and Pulmonary Rehab.

## 2019-07-14 ENCOUNTER — Encounter (HOSPITAL_COMMUNITY)
Admission: RE | Admit: 2019-07-14 | Discharge: 2019-07-14 | Disposition: A | Discharge status: Home / Self Care | Payer: Medicare Other | Source: Ambulatory Visit | Attending: Cardiology | Admitting: Cardiology

## 2019-07-14 ENCOUNTER — Other Ambulatory Visit: Payer: Self-pay

## 2019-07-14 DIAGNOSIS — I2102 ST elevation (STEMI) myocardial infarction involving left anterior descending coronary artery: Secondary | ICD-10-CM

## 2019-07-14 DIAGNOSIS — Z955 Presence of coronary angioplasty implant and graft: Secondary | ICD-10-CM

## 2019-07-14 NOTE — Progress Notes (Signed)
Daily Session Note  Patient Details  Name: Rachel Kennedy MRN: [id number] Date of Birth: 02/16/1946 Referring Provider:     [place] from 06/27/2019 in [place]  Referring Provider  [place]       Encounter Date: 07/14/2019  Check In: Session Check In - 07/14/19 1100      Check-In   Supervising physician immediately available to respond to emergencies  See telemetry face sheet for immediately available MD    Location  [facility]    Staff Present  Russella Dar, MS, EP, [facility], Exercise Physiologist;Bubber Rothert Zachery Conch, Exercise Physiologist    Virtual Visit  No    Medication changes reported      No    Fall or balance concerns reported     No    Tobacco Cessation  No Change    Warm-up and Cool-down  Performed as group-led instruction    Resistance Training Performed  Yes    VAD Patient?  No    PAD/SET Patient?  No      Pain Assessment   Currently in Pain?  No/denies    Pain Score  0-No pain    Multiple Pain Sites  No       Capillary Blood Glucose: No results found for this or any previous visit (from the past 24 hour(s)).    Social History   Tobacco Use  Smoking Status Former Smoker  Smokeless Tobacco Never Used  Tobacco Comment   quit in 2017. Smoked for 30 years prior to that.     Goals Met:  Independence with exercise equipment Exercise tolerated well No report of cardiac concerns or symptoms Strength training completed today  Goals Unmet:  Not Applicable  Comments: Pt able to follow exercise prescription today without complaint.  Will continue to monitor for progression. Check out 1200.   Dr. Kate Sable is Medical Director for [facility] Cardiac and Pulmonary Rehab.

## 2019-07-17 ENCOUNTER — Encounter (HOSPITAL_COMMUNITY)
Admission: RE | Admit: 2019-07-17 | Discharge: 2019-07-17 | Disposition: A | Discharge status: Home / Self Care | Payer: Medicare Other | Source: Ambulatory Visit | Attending: Cardiology | Admitting: Cardiology

## 2019-07-17 ENCOUNTER — Other Ambulatory Visit: Payer: Self-pay

## 2019-07-17 DIAGNOSIS — I2102 ST elevation (STEMI) myocardial infarction involving left anterior descending coronary artery: Secondary | ICD-10-CM

## 2019-07-17 DIAGNOSIS — Z955 Presence of coronary angioplasty implant and graft: Secondary | ICD-10-CM

## 2019-07-17 NOTE — Progress Notes (Signed)
Daily Session Note  Patient Details  Name: Rachel Kennedy MRN: [id number] Date of Birth: 12/23/1945 Referring Provider:     CARDIAC REHAB PHASE II ORIENTATION from 06/27/2019 in [place] [facility]  Referring Provider  [place]       Encounter Date: 07/17/2019  Check In: Session Check In - 07/17/19 1100      Check-In   Supervising physician immediately available to respond to emergencies  See telemetry face sheet for immediately available MD    Location  [facility]    Staff Present  Diane Coad, MS, EP, [facility], Exercise Physiologist;Amanda Ballard, Exercise Physiologist;Other    Virtual Visit  No    Medication changes reported      No    Fall or balance concerns reported     No    Tobacco Cessation  No Change    Warm-up and Cool-down  Performed as group-led instruction    Resistance Training Performed  Yes    VAD Patient?  No    PAD/SET Patient?  No      Pain Assessment   Currently in Pain?  No/denies    Pain Score  0-No pain    Multiple Pain Sites  No       Capillary Blood Glucose: No results found for this or any previous visit (from the past 24 hour(s)).    Social History   Tobacco Use  Smoking Status Former Smoker  Smokeless Tobacco Never Used  Tobacco Comment   quit in 2017. Smoked for 30 years prior to that.     Goals Met:  Independence with exercise equipment Exercise tolerated well No report of cardiac concerns or symptoms Strength training completed today  Goals Unmet:  Not Applicable  Comments: Pt able to follow exercise prescription today without complaint.  Will continue to monitor for progression. Check out 1200.   Dr. Suresh Koneswaran is Medical Director for [place] [facility] and Pulmonary Rehab. 

## 2019-07-19 ENCOUNTER — Other Ambulatory Visit: Payer: Self-pay

## 2019-07-19 ENCOUNTER — Encounter (HOSPITAL_COMMUNITY)
Admission: RE | Admit: 2019-07-19 | Discharge: 2019-07-19 | Disposition: A | Discharge status: Home / Self Care | Payer: Medicare Other | Source: Ambulatory Visit | Attending: Cardiology | Admitting: Cardiology

## 2019-07-19 DIAGNOSIS — I2102 ST elevation (STEMI) myocardial infarction involving left anterior descending coronary artery: Secondary | ICD-10-CM | POA: Diagnosis not present

## 2019-07-19 DIAGNOSIS — Z955 Presence of coronary angioplasty implant and graft: Secondary | ICD-10-CM

## 2019-07-19 NOTE — Progress Notes (Signed)
Daily Session Note  Patient Details  Name: Rachel Kennedy MRN: [id number] Date of Birth: 02-09-1946 Referring Provider:     [place] from 06/27/2019 in [place]  Referring Provider  [place]       Encounter Date: 07/19/2019  Check In: Session Check In - 07/19/19 1100      Check-In   Supervising physician immediately available to respond to emergencies  See telemetry face sheet for immediately available MD    Location  [facility]    Staff Present  Russella Dar, MS, EP, [facility], Exercise Physiologist;Ivee Poellnitz Zachery Conch, Exercise Physiologist    Virtual Visit  No    Medication changes reported      No    Fall or balance concerns reported     No    Tobacco Cessation  No Change    Warm-up and Cool-down  Performed as group-led instruction    Resistance Training Performed  Yes    VAD Patient?  No    PAD/SET Patient?  No      Pain Assessment   Currently in Pain?  No/denies    Pain Score  0-No pain    Multiple Pain Sites  No       Capillary Blood Glucose: No results found for this or any previous visit (from the past 24 hour(s)).    Social History   Tobacco Use  Smoking Status Former Smoker  Smokeless Tobacco Never Used  Tobacco Comment   quit in 2017. Smoked for 30 years prior to that.     Goals Met:  Independence with exercise equipment Exercise tolerated well No report of cardiac concerns or symptoms Strength training completed today  Goals Unmet:  Not Applicable  Comments: Pt able to follow exercise prescription today without complaint.  Will continue to monitor for progression. Check out 1200.   Dr. Kate Sable is Medical Director for [facility] Cardiac and Pulmonary Rehab.

## 2019-07-21 ENCOUNTER — Encounter (HOSPITAL_COMMUNITY)
Admission: RE | Admit: 2019-07-21 | Discharge: 2019-07-21 | Disposition: A | Discharge status: Home / Self Care | Payer: Medicare Other | Source: Ambulatory Visit | Attending: Cardiology | Admitting: Cardiology

## 2019-07-21 ENCOUNTER — Other Ambulatory Visit: Payer: Self-pay

## 2019-07-21 DIAGNOSIS — Z955 Presence of coronary angioplasty implant and graft: Secondary | ICD-10-CM

## 2019-07-21 DIAGNOSIS — I2102 ST elevation (STEMI) myocardial infarction involving left anterior descending coronary artery: Secondary | ICD-10-CM

## 2019-07-21 NOTE — Progress Notes (Signed)
Daily Session Note  Patient Details  Name: Rachel Kennedy MRN: [id number] Date of Birth: [birth year] Referring Provider:     [place] from 06/27/2019 in [place]  Referring Provider  [place]       Encounter Date: 07/21/2019  Check In: Session Check In - 07/21/19 1100      Check-In   Supervising physician immediately available to respond to emergencies  See telemetry face sheet for immediately available MD    Location  [facility]    Staff Present  Russella Dar, MS, EP, [facility], Exercise Physiologist;Bobbette Eakes Zachery Conch, Exercise Physiologist    Virtual Visit  No    Medication changes reported      No    Fall or balance concerns reported     No    Tobacco Cessation  No Change    Warm-up and Cool-down  Performed as group-led instruction    Resistance Training Performed  Yes    VAD Patient?  No    PAD/SET Patient?  No      Pain Assessment   Currently in Pain?  No/denies    Pain Score  0-No pain    Multiple Pain Sites  No       Capillary Blood Glucose: No results found for this or any previous visit (from the past 24 hour(s)).    Social History   Tobacco Use  Smoking Status Former Smoker  Smokeless Tobacco Never Used  Tobacco Comment   quit in 2017. Smoked for 30 years prior to that.     Goals Met:  Independence with exercise equipment Exercise tolerated well No report of cardiac concerns or symptoms Strength training completed today  Goals Unmet:  Not Applicable  Comments: Pt able to follow exercise prescription today without complaint.  Will continue to monitor for progression. Check out 1200.   Dr. Kate Sable is Medical Director for [facility] Cardiac and Pulmonary Rehab.

## 2019-07-24 ENCOUNTER — Encounter (HOSPITAL_COMMUNITY)
Admission: RE | Admit: 2019-07-24 | Discharge: 2019-07-24 | Disposition: A | Discharge status: Home / Self Care | Payer: Medicare Other | Source: Ambulatory Visit | Attending: Cardiology | Admitting: Cardiology

## 2019-07-24 ENCOUNTER — Other Ambulatory Visit: Payer: Self-pay

## 2019-07-24 DIAGNOSIS — Z955 Presence of coronary angioplasty implant and graft: Secondary | ICD-10-CM | POA: Insufficient documentation

## 2019-07-24 DIAGNOSIS — I2102 ST elevation (STEMI) myocardial infarction involving left anterior descending coronary artery: Secondary | ICD-10-CM | POA: Insufficient documentation

## 2019-07-24 NOTE — Progress Notes (Signed)
Daily Session Note  Patient Details  Name: Rachel Kennedy MRN: [id number] Date of Birth: February 16, 1946 Referring Provider:     [place] from 06/27/2019 in [place]  Referring Provider  [place]       Encounter Date: 07/24/2019  Check In: Session Check In - 07/24/19 1100      Check-In   Supervising physician immediately available to respond to emergencies  See telemetry face sheet for immediately available MD    Location  [facility]    Staff Present  Russella Dar, MS, EP, [facility], Exercise Physiologist;Kikuye Korenek, Exercise Physiologist;Debra Wynetta Emery, RN, BSN    Virtual Visit  No    Medication changes reported      No    Fall or balance concerns reported     No    Tobacco Cessation  No Change    Warm-up and Cool-down  Performed as group-led instruction    Resistance Training Performed  Yes    VAD Patient?  No    PAD/SET Patient?  No      Pain Assessment   Currently in Pain?  No/denies    Pain Score  0-No pain    Multiple Pain Sites  No       Capillary Blood Glucose: No results found for this or any previous visit (from the past 24 hour(s)).    Social History   Tobacco Use  Smoking Status Former Smoker  Smokeless Tobacco Never Used  Tobacco Comment   quit in 2017. Smoked for 30 years prior to that.     Goals Met:  Independence with exercise equipment Exercise tolerated well No report of cardiac concerns or symptoms Strength training completed today  Goals Unmet:  Not Applicable  Comments: Pt able to follow exercise prescription today without complaint.  Will continue to monitor for progression. Check out 1200.   Dr. Kate Sable is Medical Director for [facility] Cardiac and Pulmonary Rehab.

## 2019-07-26 ENCOUNTER — Other Ambulatory Visit: Payer: Self-pay

## 2019-07-26 ENCOUNTER — Encounter (HOSPITAL_COMMUNITY)
Admission: RE | Admit: 2019-07-26 | Discharge: 2019-07-26 | Disposition: A | Discharge status: Home / Self Care | Payer: Medicare Other | Source: Ambulatory Visit | Attending: Cardiology | Admitting: Cardiology

## 2019-07-26 DIAGNOSIS — I2102 ST elevation (STEMI) myocardial infarction involving left anterior descending coronary artery: Secondary | ICD-10-CM

## 2019-07-26 DIAGNOSIS — Z955 Presence of coronary angioplasty implant and graft: Secondary | ICD-10-CM

## 2019-07-26 NOTE — Progress Notes (Signed)
Daily Session Note  Patient Details  Name: Rachel Kennedy MRN: [id number] Date of Birth: September 24, 1946 Referring Provider:     [place] from 06/27/2019 in [place]  Referring Provider  [place]       Encounter Date: 07/26/2019  Check In: Session Check In - 07/26/19 1100      Check-In   Supervising physician immediately available to respond to emergencies  See telemetry face sheet for immediately available MD    Location  [facility]    Staff Present  Russella Dar, MS, EP, [facility], Exercise Physiologist;Ruhaan Nordahl, Exercise Physiologist;Debra Wynetta Emery, RN, BSN    Virtual Visit  No    Medication changes reported      No    Fall or balance concerns reported     No    Tobacco Cessation  No Change    Warm-up and Cool-down  Performed as group-led instruction    Resistance Training Performed  Yes    VAD Patient?  No    PAD/SET Patient?  No      Pain Assessment   Currently in Pain?  No/denies    Pain Score  0-No pain    Multiple Pain Sites  No       Capillary Blood Glucose: No results found for this or any previous visit (from the past 24 hour(s)).    Social History   Tobacco Use  Smoking Status Former Smoker  Smokeless Tobacco Never Used  Tobacco Comment   quit in 2017. Smoked for 30 years prior to that.     Goals Met:  Independence with exercise equipment Exercise tolerated well No report of cardiac concerns or symptoms Strength training completed today  Goals Unmet:  Not Applicable  Comments: Pt able to follow exercise prescription today without complaint.  Will continue to monitor for progression. Check out 1200.   Dr. Kate Sable is Medical Director for [facility] Cardiac and Pulmonary Rehab.

## 2019-07-28 ENCOUNTER — Encounter (HOSPITAL_COMMUNITY)
Admission: RE | Admit: 2019-07-28 | Discharge: 2019-07-28 | Disposition: A | Discharge status: Home / Self Care | Payer: Medicare Other | Source: Ambulatory Visit | Attending: Cardiology | Admitting: Cardiology

## 2019-07-28 ENCOUNTER — Other Ambulatory Visit: Payer: Self-pay

## 2019-07-28 DIAGNOSIS — I2102 ST elevation (STEMI) myocardial infarction involving left anterior descending coronary artery: Secondary | ICD-10-CM | POA: Diagnosis not present

## 2019-07-28 DIAGNOSIS — Z955 Presence of coronary angioplasty implant and graft: Secondary | ICD-10-CM

## 2019-07-28 NOTE — Progress Notes (Signed)
Daily Session Note  Patient Details  Name: CHRISTIANNE ZACHER MRN: [id number] Date of Birth: 01-Sep-1946 Referring Provider:     [place] from 06/27/2019 in [place]  Referring Provider  [place]       Encounter Date: 07/28/2019  Check In: Session Check In - 07/28/19 1100      Check-In   Supervising physician immediately available to respond to emergencies  See telemetry face sheet for immediately available MD    Location  [facility]    Staff Present  Russella Dar, MS, EP, [facility], Exercise Physiologist;Agustin Swatek, Exercise Physiologist;Debra Wynetta Emery, RN, BSN    Virtual Visit  No    Medication changes reported      No    Fall or balance concerns reported     No    Tobacco Cessation  No Change    Warm-up and Cool-down  Performed as group-led instruction    Resistance Training Performed  Yes    VAD Patient?  No    PAD/SET Patient?  No      Pain Assessment   Currently in Pain?  No/denies    Pain Score  0-No pain    Multiple Pain Sites  No       Capillary Blood Glucose: No results found for this or any previous visit (from the past 24 hour(s)).    Social History   Tobacco Use  Smoking Status Former Smoker  Smokeless Tobacco Never Used  Tobacco Comment   quit in 2017. Smoked for 30 years prior to that.     Goals Met:  Independence with exercise equipment Exercise tolerated well No report of cardiac concerns or symptoms Strength training completed today  Goals Unmet:  Not Applicable  Comments: Pt able to follow exercise prescription today without complaint.  Will continue to monitor for progression. Check out 1200.   Dr. Kate Sable is Medical Director for [facility] Cardiac and Pulmonary Rehab.

## 2019-07-31 ENCOUNTER — Encounter (HOSPITAL_COMMUNITY)
Admission: RE | Admit: 2019-07-31 | Discharge: 2019-07-31 | Disposition: A | Discharge status: Home / Self Care | Payer: Medicare Other | Source: Ambulatory Visit | Attending: Cardiology | Admitting: Cardiology

## 2019-07-31 ENCOUNTER — Other Ambulatory Visit: Payer: Self-pay

## 2019-07-31 DIAGNOSIS — I2102 ST elevation (STEMI) myocardial infarction involving left anterior descending coronary artery: Secondary | ICD-10-CM

## 2019-07-31 DIAGNOSIS — Z955 Presence of coronary angioplasty implant and graft: Secondary | ICD-10-CM

## 2019-07-31 NOTE — Progress Notes (Signed)
Daily Session Note  Patient Details  Name: Rachel Kennedy MRN: [id number] Date of Birth: 08/07/1946 Referring Provider:     CARDIAC REHAB PHASE II ORIENTATION from 06/27/2019 in [place] [facility]AC RE[facility]  Referring Provider  [place]       Encounter Date: 07/31/2019  Check In: Session Check In - 07/31/19 1110      Check-In   Supervising physician immediately available to respond to emergencies  See telemetry face sheet for immediately available MD    Location  [facility]    Staff Present  Diane Coad, MS, EP, [facility], Exercise Physiologist;Debra Johnson, RN, BSN;Amanda Ballard, Exercise Physiologist    Virtual Visit  No    Medication changes reported      No    Fall or balance concerns reported     No    Tobacco Cessation  No Change    Warm-up and Cool-down  Performed as group-led instruction    Resistance Training Performed  Yes    VAD Patient?  No    PAD/SET Patient?  No      Pain Assessment   Currently in Pain?  No/denies    Pain Score  0-No pain    Multiple Pain Sites  No       Capillary Blood Glucose: No results found for this or any previous visit (from the past 24 hour(s)).    Social History   Tobacco Use  Smoking Status Former Smoker  Smokeless Tobacco Never Used  Tobacco Comment   quit in 2017. Smoked for 30 years prior to that.     Goals Met:  Independence with exercise equipment Exercise tolerated well Personal goals reviewed No report of cardiac concerns or symptoms Strength training completed today  Goals Unmet:  Not Applicable  Comments: Check out: 1200   Dr. Suresh Koneswaran is Medical Director for [place] Cardiac and Pulmonary Rehab. 

## 2019-08-01 NOTE — Progress Notes (Signed)
Cardiac Individual Treatment Plan  Patient Details  Name: Rachel Kennedy MRN: [id number] Date of Birth: 1946-12-02 Referring Provider:     CARDIAC REHAB PHASE II ORIENTATION from 06/27/2019 in [place] [facility]  Referring Provider  [place]       Initial Encounter Date:    CARDIAC REHAB PHASE II ORIENTATION from 06/27/2019 in [place] [facility]  Date  06/27/19      Visit Diagnosis: ST elevation myocardial infarction involving left anterior descending (LAD) coronary artery [facility])   Status post coronary artery stent placement   Patient's Home Medications on Admission:  Current Outpatient Medications:  .  acetaminophen (TYLENOL) 500 MG tablet, Take 500 mg by mouth every 6 (six) hours as needed for headache (pain)., Disp: , Rfl:  .  aspirin EC 81 MG tablet, Take 1 tablet (81 mg total) by mouth daily., Disp: , Rfl:  .  atorvastatin (LIPITOR) 80 MG tablet, TAKE 1 TABLET (80 MG TOTAL) BY MOUTH DAILY AT 6 PM., Disp: 90 tablet, Rfl: 0 .  buPROPion (WELLBUTRIN XL) 300 MG 24 hr tablet, Take 300 mg by mouth daily., Disp: , Rfl: 1 .  carvedilol (COREG) 6.25 MG tablet, TAKE 1 TABLET (6.25 MG TOTAL) BY MOUTH 2 (TWO) TIMES DAILY WITH A MEAL., Disp: 180 tablet, Rfl: 1 .  cycloSPORINE (RESTASIS) 0.05 % ophthalmic emulsion, Place 1 drop into both eyes 2 (two) times daily., Disp: , Rfl:  .  doxycycline (PERIOSTAT) 20 MG tablet, Take 20 mg by mouth 2 (two) times daily., Disp: , Rfl: 2 .  losartan (COZAAR) 25 MG tablet, TAKE 1 TABLET BY MOUTH EVERY DAY, Disp: 90 tablet, Rfl: 0 .  nitroGLYCERIN (NITROSTAT) 0.4 MG SL tablet, Place 1 tablet (0.4 mg total) under the tongue every 5 (five) minutes x 3 doses as needed for chest pain., Disp: 25 tablet, Rfl: 1 .  PRESCRIPTION MEDICATION, Apply 1 application topically daily as needed (arthritis pain). Arthritis cream compounded at [facility]   Diclofenac 3% Baclofen 2% gabapentin 5% Lidocaine 5% Menthol 1%, Disp: , Rfl:  .   sertraline (ZOLOFT) 100 MG tablet, Take 100 mg by mouth at bedtime. , Disp: , Rfl:  .  ticagrelor (BRILINTA) 90 MG TABS tablet, Take 1 tablet (90 mg total) by mouth 2 (two) times daily., Disp: 180 tablet, Rfl: 2 .  zolpidem (AMBIEN) 10 MG tablet, Take 10 mg by mouth at bedtime as needed for sleep. , Disp: , Rfl:   Past Medical History: Past Medical History:  Diagnosis Date  . Acute systolic (congestive) heart failure (HCC)   . CAD (coronary artery disease) 05/07/2019  . Depression 05/07/2019  . Hyperlipidemia   . Hypertension   . STEMI (ST elevation myocardial infarction) (HCC)     Tobacco Use: Social History   Tobacco Use  Smoking Status Former Smoker  Smokeless Tobacco Never Used  Tobacco Comment   quit in 2017. Smoked for 30 years prior to that.     Labs: Recent Review Flowsheet Data    Labs for ITP Cardiac and Pulmonary Rehab Latest Ref Rng & Units 02/09/2019 02/10/2019 04/10/2019   Cholestrol 0 - 200 mg/dL - 409 811   LDLCALC 0 - 99 mg/dL - 914(N) 61   HDL >82 mg/dL - 95(A) 52   Trlycerides <150 mg/dL - 213(Y) 865   Hemoglobin A1c 4.8 - 5.6 % 5.8(H) - -      Capillary Blood Glucose: No results found for: GLUCAP   Exercise Target Goals: Exercise Program Goal:  Individual exercise prescription set using results from initial 6 min walk test and THRR while considering  patient's activity barriers and safety.   Exercise Prescription Goal: Starting with aerobic activity 30 plus minutes a day, 3 days per week for initial exercise prescription. Provide home exercise prescription and guidelines that participant acknowledges understanding prior to discharge.  Activity Barriers & Risk Stratification: Activity Barriers & Cardiac Risk Stratification - 06/27/19 1406      Activity Barriers & Cardiac Risk Stratification   Cardiac Risk Stratification  High       6 Minute Walk: 6 Minute Walk    Row Name 06/27/19 1405         6 Minute Walk   Phase  Initial     Distance   1400 feet     Walk Time  6 minutes     # of Rest Breaks  0     MPH  2.6     METS  3     RPE  10     Perceived Dyspnea   10     VO2 Peak  10.01     Symptoms  No     Resting HR  58 bpm     Resting BP  150/72     Resting Oxygen Saturation   94 %     Exercise Oxygen Saturation  during 6 min walk  92 %     Max Ex. HR  83 bpm     Max Ex. BP  156/78     2 Minute Post BP  146/70        Oxygen Initial Assessment:   Oxygen Re-Evaluation:   Oxygen Discharge (Final Oxygen Re-Evaluation):   Initial Exercise Prescription: Initial Exercise Prescription - 06/27/19 1400      Date of Initial Exercise RX and Referring Provider   Date  06/27/19    Referring Provider  [place]     Expected Discharge Date  09/27/19      Treadmill   MPH  2    Grade  0    Minutes  17    METs  2.9      NuStep   Level  1    SPM  50    Minutes  17    METs  2      Prescription Details   Frequency (times per week)  3    Duration  Progress to 30 minutes of continuous aerobic without signs/symptoms of physical distress      Intensity   THRR 40-80% of Max Heartrate  36-54-72    Ratings of Perceived Exertion  11-13    Perceived Dyspnea  0-4      Progression   Progression  Continue to progress workloads to maintain intensity without signs/symptoms of physical distress.      Resistance Training   Training Prescription  Yes    Weight  1    Reps  10-15       Perform Capillary Blood Glucose checks as needed.  Exercise Prescription Changes:  Exercise Prescription Changes    Row Name 07/20/19 1300 08/01/19 0900           Response to Exercise   Blood Pressure (Admit)  124/64  134/70      Blood Pressure (Exercise)  138/62  148/82      Blood Pressure (Exit)  120/64  122/70      Heart Rate (Admit)  60 bpm  59 bpm      Heart  Rate (Exercise)  82 bpm  78 bpm      Heart Rate (Exit)  69 bpm  64 bpm      Rating of Perceived Exertion (Exercise)  11  11      Comments  first two weeks of exercise   -       Duration  Continue with 30 min of aerobic exercise without signs/symptoms of physical distress.  Continue with 30 min of aerobic exercise without signs/symptoms of physical distress.      Intensity  THRR New [id number]  THRR unchanged        Progression   Progression  Continue to progress workloads to maintain intensity without signs/symptoms of physical distress.  Continue to progress workloads to maintain intensity without signs/symptoms of physical distress.      Average METs  2.7  2.55        Resistance Training   Training Prescription  Yes  Yes      Weight  2  2      Reps  10-15  10-15        Treadmill   MPH  - couldnt get gait correct   -        NuStep   Level  2  2      SPM  95  97      Minutes  17  17      METs  2.1  2        Recumbant Elliptical   Level  2  2      RPM  50  49      Watts  54  72      Minutes  22  22      METs  3.3  3.1         Exercise Comments:  Exercise Comments    Row Name 07/10/19 1320 08/01/19 0946         Exercise Comments  Pt. is still new to the program, she has attended 7 sessions. She is working on the [employer] and [employer] Elliptical. She has tolerated the exercise well and already started to increase her workloads. We will progress her as needed.  Pt. has now attended 16 exercise sessions. Each session she comes willing to work hard and increase her personal output on the equipment. She has tolerated the exercise well.         Exercise Goals and Review:  Exercise Goals    Row Name 06/27/19 1407             Exercise Goals   Increase Physical Activity  Yes       Intervention  Develop an individualized exercise prescription for aerobic and resistive training based on initial evaluation findings, risk stratification, comorbidities and participant's personal goals.       Expected Outcomes  Long Term: Add in home exercise to make exercise part of routine and to increase amount of physical activity.;Short Term: Attend rehab on a  regular basis to increase amount of physical activity.;Long Term: Exercising regularly at least 3-5 days a week.       Increase Strength and Stamina  Yes       Intervention  Develop an individualized exercise prescription for aerobic and resistive training based on initial evaluation findings, risk stratification, comorbidities and participant's personal goals.       Expected Outcomes  Short Term: Increase workloads from initial exercise prescription for resistance, speed, and METs.;Short Term: Perform resistance training exercises routinely  during rehab and add in resistance training at home;Long Term: Improve cardiorespiratory fitness, muscular endurance and strength as measured by increased METs and functional capacity (6MWT)       Able to understand and use rate of perceived exertion (RPE) scale  Yes       Intervention  Provide education and explanation on how to use RPE scale       Expected Outcomes  Short Term: Able to use RPE daily in rehab to express subjective intensity level;Long Term:  Able to use RPE to guide intensity level when exercising independently       Knowledge and understanding of Target Heart Rate Range (THRR)  Yes       Intervention  Provide education and explanation of THRR including how the numbers were predicted and where they are located for reference       Expected Outcomes  Short Term: Able to state/look up THRR;Long Term: Able to use THRR to govern intensity when exercising independently;Short Term: Able to use daily as guideline for intensity in rehab       Able to check pulse independently  Yes       Intervention  Provide education and demonstration on how to check pulse in carotid and radial arteries.;Review the importance of being able to check your own pulse for safety during independent exercise       Expected Outcomes  Short Term: Able to explain why pulse checking is important during independent exercise;Long Term: Able to check pulse independently and accurately        Understanding of Exercise Prescription  Yes       Intervention  Provide education, explanation, and written materials on patient's individual exercise prescription       Expected Outcomes  Short Term: Able to explain program exercise prescription;Long Term: Able to explain home exercise prescription to exercise independently          Exercise Goals Re-Evaluation : Exercise Goals Re-Evaluation    Row Name 07/10/19 1317 08/01/19 0945           Exercise Goal Re-Evaluation   Exercise Goals Review  Increase Physical Activity;Increase Strength and Stamina;Able to understand and use rate of perceived exertion (RPE) scale;Knowledge and understanding of Target Heart Rate Range (THRR);Able to check pulse independently;Understanding of Exercise Prescription  Increase Physical Activity;Increase Strength and Stamina;Able to understand and use rate of perceived exertion (RPE) scale;Knowledge and understanding of Target Heart Rate Range (THRR);Able to check pulse independently;Understanding of Exercise Prescription      Comments  Pt. is new to the program. She has attended regularly and says this has helped her to get back to routine activity.  Pt. has done excellent in the program so far. She attends consistantly in order to help her reach her goals. She works hard to go farther distances on both machines every class.      Expected Outcomes  Short: increase overall activity levels. Long: increase overall heart health.  Short: increase overall activity levels. Long: increase overall heart health.          Discharge Exercise Prescription (Final Exercise Prescription Changes): Exercise Prescription Changes - 08/01/19 0900      Response to Exercise   Blood Pressure (Admit)  134/70    Blood Pressure (Exercise)  148/82    Blood Pressure (Exit)  122/70    Heart Rate (Admit)  59 bpm    Heart Rate (Exercise)  78 bpm    Heart Rate (Exit)  64 bpm  Rating of Perceived Exertion (Exercise)  11    Duration   Continue with 30 min of aerobic exercise without signs/symptoms of physical distress.    Intensity  THRR unchanged      Progression   Progression  Continue to progress workloads to maintain intensity without signs/symptoms of physical distress.    Average METs  2.55      Resistance Training   Training Prescription  Yes    Weight  2    Reps  10-15      NuStep   Level  2    SPM  97    Minutes  17    METs  2      Recumbant Elliptical   Level  2    RPM  49    Watts  72    Minutes  22    METs  3.1       Nutrition:  Target Goals: Understanding of nutrition guidelines, daily intake of sodium 1500mg , cholesterol 200mg , calories 30% from fat and 7% or less from saturated fats, daily to have 5 or more servings of fruits and vegetables.  Biometrics: Pre Biometrics - 06/27/19 1408      Pre Biometrics   Height  5\' 6"  (1.676 m)    Weight  83.7 kg    Waist Circumference  37 inches    Hip Circumference  48 inches    Waist to Hip Ratio  0.77 %    BMI (Calculated)  29.8    Triceps Skinfold  14 mm    % Body Fat  37.4 %    Grip Strength  3.4 kg    Single Leg Stand  4 seconds        Nutrition Therapy Plan and Nutrition Goals: Nutrition Therapy & Goals - 08/01/19 1441      Personal Nutrition Goals   Comments  The RD meeting has not resumed post COVID-19. We are working on resuming these classes safely. We are also working with a new dietician. In the interim, we are providing education through hand-outs and one-on-one teaching. Patient says she trys to eat a healthy diet. Will continue to monitor for progress.      Intervention Plan   Intervention  Nutrition handout(s) given to patient.       Nutrition Assessments: Nutrition Assessments - 06/27/19 1511      Rate Your Plate Scores   Pre Score  59       Nutrition Goals Re-Evaluation:   Nutrition Goals Discharge (Final Nutrition Goals Re-Evaluation):   Psychosocial: Target Goals: Acknowledge presence or absence of  significant depression and/or stress, maximize coping skills, provide positive support system. Participant is able to verbalize types and ability to use techniques and skills needed for reducing stress and depression.  Initial Review & Psychosocial Screening: Initial Psych Review & Screening - 06/27/19 1455      Initial Review   Current issues with  None Identified      Family Dynamics   Good Support System?  Yes      Barriers   Psychosocial barriers to participate in program  There are no identifiable barriers or psychosocial needs.      Screening Interventions   Interventions  Encouraged to exercise;Provide feedback about the scores to participant    Expected Outcomes  Long Term goal: The participant improves quality of Life and PHQ9 Scores as seen by post scores and/or verbalization of changes;Short Term goal: Identification and review with participant of any Quality of  Life or Depression concerns found by scoring the questionnaire.       Quality of Life Scores: Quality of Life - 06/27/19 1409      Quality of Life   Select  Quality of Life      Quality of Life Scores   Health/Function Pre  22.37 %    Socioeconomic Pre  21.83 %    Psych/Spiritual Pre  20.36 %    Family Pre  19.5 %    GLOBAL Pre  21.47 %      Scores of 19 and below usually indicate a poorer quality of life in these areas.  A difference of  2-3 points is a clinically meaningful difference.  A difference of 2-3 points in the total score of the Quality of Life Index has been associated with significant improvement in overall quality of life, self-image, physical symptoms, and general health in studies assessing change in quality of life.  PHQ-9: Recent Review Flowsheet Data    Depression [facility] 06/27/2019   Decreased Interest 0   Down, Depressed, Hopeless 0   PHQ - 2 Score 0   Altered sleeping 1   Tired, decreased energy 1   Change in appetite 0   Feeling bad or failure about yourself  0   Trouble  concentrating 0   Moving slowly or fidgety/restless 0   Suicidal thoughts 0   PHQ-9 Score 2   Difficult doing work/chores Not difficult at all     Interpretation of Total Score  Total Score Depression Severity:  1-4 = Minimal depression, 5-9 = Mild depression, 10-14 = Moderate depression, 15-19 = Moderately severe depression, 20-27 = Severe depression   Psychosocial Evaluation and Intervention: Psychosocial Evaluation - 06/27/19 1509      Psychosocial Evaluation & Interventions   Interventions  Encouraged to exercise with the program and follow exercise prescription;Stress management education;Relaxation education    Comments  Patient's initial PHQ-9 score was 3 and her QOL score was 21.47 with no psychosocial issues identified.    Expected Outcomes  Patient will have no psychosocial issued identified at discharge.    Continue Psychosocial Services   No Follow up required       Psychosocial Re-Evaluation: Psychosocial Re-Evaluation    Row Name 07/10/19 1506 08/01/19 1445           Psychosocial Re-Evaluation   Current issues with  None Identified  None Identified      Comments  Patient's initial QOL score was 21.47 and her PHQ-9 score was 2 with no psychosocial issues identified. Will continue to monitor for progress.  Patient's initial QOL score was 21.47 and her PHQ-9 score was 2 with no psychosocial issues identified. Will continue to monitor for progress.      Expected Outcomes  Patient will have no psychosocial issues identified at discharge.  Patient will have no psychosocial issues identified at discharge.      Interventions  Stress management education;Encouraged to attend Cardiac Rehabilitation for the exercise;Relaxation education  Stress management education;Encouraged to attend Cardiac Rehabilitation for the exercise;Relaxation education      Continue Psychosocial Services   No Follow up required  No Follow up required         Psychosocial Discharge (Final  Psychosocial Re-Evaluation): Psychosocial Re-Evaluation - 08/01/19 1445      Psychosocial Re-Evaluation   Current issues with  None Identified    Comments  Patient's initial QOL score was 21.47 and her PHQ-9 score was 2 with no psychosocial issues  identified. Will continue to monitor for progress.    Expected Outcomes  Patient will have no psychosocial issues identified at discharge.    Interventions  Stress management education;Encouraged to attend Cardiac Rehabilitation for the exercise;Relaxation education    Continue Psychosocial Services   No Follow up required       Vocational Rehabilitation: Provide vocational rehab assistance to qualifying candidates.   Vocational Rehab Evaluation & Intervention: Vocational Rehab - 06/27/19 1514      Initial Vocational Rehab Evaluation & Intervention   Assessment shows need for Vocational Rehabilitation  No      Vocational Rehab Re-Evaulation   Comments  Patient is a retired Engineer, site.       Education: Education Goals: Education classes will be provided on a weekly basis, covering required topics. Participant will state understanding/return demonstration of topics presented.  Learning Barriers/Preferences: Learning Barriers/Preferences - 06/27/19 1513      Learning Barriers/Preferences   Learning Barriers  None    Learning Preferences  Written Material;Skilled Demonstration       Education Topics: Hypertension, Hypertension Reduction -Define heart disease and high blood pressure. Discus how high blood pressure affects the body and ways to reduce high blood pressure.   Exercise and Your Heart -Discuss why it is important to exercise, the FITT principles of exercise, normal and abnormal responses to exercise, and how to exercise safely.   CARDIAC REHAB PHASE II EXERCISE from 07/19/2019 in [facility]  Date  06/28/19  Educator  D. Coad  Instruction Review Code  2- Demonstrated Understanding       Angina -Discuss definition of angina, causes of angina, treatment of angina, and how to decrease risk of having angina.   CARDIAC REHAB PHASE II EXERCISE from 07/19/2019 in [place] [facility]  Date  07/05/19  Educator  Timoteo Expose  Instruction Review Code  2- Demonstrated Understanding      Cardiac Medications -Review what the following cardiac medications are used for, how they affect the body, and side effects that may occur when taking the medications.  Medications include Aspirin, Beta blockers, calcium channel blockers, ACE Inhibitors, angiotensin receptor blockers, diuretics, digoxin, and antihyperlipidemics.   CARDIAC REHAB PHASE II EXERCISE from 07/19/2019 in [place] CARDIAC REHABILITATION  Date  07/12/19  Educator  Laural Benes  Instruction Review Code  2- Demonstrated Understanding      Congestive Heart Failure -Discuss the definition of CHF, how to live with CHF, the signs and symptoms of CHF, and how keep track of weight and sodium intake.   CARDIAC REHAB PHASE II EXERCISE from 07/19/2019 in [place] [facility]  Date  07/19/19  Educator  DC  Instruction Review Code  2- Demonstrated Understanding      Heart Disease and Intimacy -Discus the effect sexual activity has on the heart, how changes occur during intimacy as we age, and safety during sexual activity.   Smoking Cessation / COPD -Discuss different methods to quit smoking, the health benefits of quitting smoking, and the definition of COPD.   Nutrition I: Fats -Discuss the types of cholesterol, what cholesterol does to the heart, and how cholesterol levels can be controlled.   Nutrition II: Labels -Discuss the different components of food labels and how to read food label   Heart Parts/Heart Disease and PAD -Discuss the anatomy of the heart, the pathway of blood circulation through the heart, and these are affected by heart disease.   Stress I: Signs and Symptoms -Discuss  the causes  of stress, how stress may lead to anxiety and depression, and ways to limit stress.   Stress II: Relaxation -Discuss different types of relaxation techniques to limit stress.   Warning Signs of Stroke / TIA -Discuss definition of a stroke, what the signs and symptoms are of a stroke, and how to identify when someone is having stroke.   Knowledge Questionnaire Score: Knowledge Questionnaire Score - 06/27/19 1514      Knowledge Questionnaire Score   Pre Score  20/24       Core Components/Risk Factors/Patient Goals at Admission: Personal Goals and Risk Factors at Admission - 06/27/19 1514      Core Components/Risk Factors/Patient Goals on Admission    Weight Management  Obesity    Improve shortness of breath with ADL's  Yes    Intervention  Provide education, individualized exercise plan and daily activity instruction to help decrease symptoms of SOB with activities of daily living.    Expected Outcomes  Short Term: Improve cardiorespiratory fitness to achieve a reduction of symptoms when performing ADLs    Personal Goal Other  Yes    Personal Goal  Get in better shape and get heart healthy. Learn about heart health and heart disease.    Intervention  Attend CR 3 days/week and supplement with exercise at home 2 days/week.    Expected Outcomes  Patient will meet her personal goals.       Core Components/Risk Factors/Patient Goals Review:  Goals and Risk Factor Review    Row Name 07/10/19 1504 08/01/19 1446           Core Components/Risk Factors/Patient Goals Review   Personal Goals Review  Weight Management/Obesity;Improve shortness of breath with ADL's;Other Get in shape and get heart healthy.  Weight Management/Obesity;Improve shortness of breath with ADL's;Other Get in shape and get heart healty.      Review  Patient has completed 7 sessions maintaining her weight since she started the program. She says her muscles feels sore the next day which she feels is a sign  she is working hard and the program is benefiting her. She hopes that as she continues she will get in shape and heart healthy. Will continue to monitor for progress.  Patient has completed 16 sessions losing 1 lb since her initial visit. She is doing well in the program with progression. She says she is feeling stronger and better overall. She feels like she is getting in shape. She says she enjoys coming to the program. Will continue to monitor for progress.      Expected Outcomes  Patient will continue to attend sessions and complete the program meeting her personal goals.  Patient will continue to attend sessions and complete the program meeting her personal goals.         Core Components/Risk Factors/Patient Goals at Discharge (Final Review):  Goals and Risk Factor Review - 08/01/19 1446      Core Components/Risk Factors/Patient Goals Review   Personal Goals Review  Weight Management/Obesity;Improve shortness of breath with ADL's;Other   Get in shape and get heart healty.   Review  Patient has completed 16 sessions losing 1 lb since her initial visit. She is doing well in the program with progression. She says she is feeling stronger and better overall. She feels like she is getting in shape. She says she enjoys coming to the program. Will continue to monitor for progress.    Expected Outcomes  Patient will continue to attend sessions and complete the program  meeting her personal goals.       ITP Comments:   Comments: ITP REVIEW Patient is doing well in the program. Will continue to monitor for progress.

## 2019-08-02 ENCOUNTER — Other Ambulatory Visit: Payer: Self-pay

## 2019-08-02 ENCOUNTER — Encounter (HOSPITAL_COMMUNITY)
Admission: RE | Admit: 2019-08-02 | Discharge: 2019-08-02 | Disposition: A | Discharge status: Home / Self Care | Payer: Medicare Other | Source: Ambulatory Visit | Attending: Cardiology | Admitting: Cardiology

## 2019-08-02 DIAGNOSIS — I2102 ST elevation (STEMI) myocardial infarction involving left anterior descending coronary artery: Secondary | ICD-10-CM

## 2019-08-02 DIAGNOSIS — Z955 Presence of coronary angioplasty implant and graft: Secondary | ICD-10-CM

## 2019-08-02 NOTE — Progress Notes (Signed)
Daily Session Note  Patient Details  Name: LASONJA LAKINS MRN: [id number] Date of Birth: 1946-03-25 Referring Provider:     [place] from 06/27/2019 in [place]  Referring Provider  [place]       Encounter Date: 08/02/2019  Check In: Session Check In - 08/02/19 1100      Check-In   Supervising physician immediately available to respond to emergencies  See telemetry face sheet for immediately available MD    Location  [facility]    Staff Present  Russella Dar, MS, EP, [facility], Exercise Physiologist;Debra Wynetta Emery, RN, Cory Munch, Exercise Physiologist    Virtual Visit  No    Medication changes reported      No    Fall or balance concerns reported     No    Tobacco Cessation  No Change    Warm-up and Cool-down  Performed as group-led instruction    Resistance Training Performed  Yes    VAD Patient?  No    PAD/SET Patient?  No      Pain Assessment   Currently in Pain?  No/denies    Pain Score  0-No pain    Multiple Pain Sites  No       Capillary Blood Glucose: No results found for this or any previous visit (from the past 24 hour(s)).    Social History   Tobacco Use  Smoking Status Former Smoker  Smokeless Tobacco Never Used  Tobacco Comment   quit in 2017. Smoked for 30 years prior to that.     Goals Met:  Independence with exercise equipment Exercise tolerated well No report of cardiac concerns or symptoms Strength training completed today  Goals Unmet:  Not Applicable  Comments: Pt able to follow exercise prescription today without complaint.  Will continue to monitor for progression. Check out 1200.   Dr. Kate Sable is Medical Director for [facility] Cardiac and Pulmonary Rehab.

## 2019-08-04 ENCOUNTER — Encounter (HOSPITAL_COMMUNITY)
Admission: RE | Admit: 2019-08-04 | Discharge: 2019-08-04 | Disposition: A | Discharge status: Home / Self Care | Payer: Medicare Other | Source: Ambulatory Visit | Attending: Cardiology | Admitting: Cardiology

## 2019-08-04 ENCOUNTER — Other Ambulatory Visit: Payer: Self-pay

## 2019-08-04 DIAGNOSIS — I2102 ST elevation (STEMI) myocardial infarction involving left anterior descending coronary artery: Secondary | ICD-10-CM

## 2019-08-04 DIAGNOSIS — Z955 Presence of coronary angioplasty implant and graft: Secondary | ICD-10-CM

## 2019-08-04 NOTE — Progress Notes (Signed)
Daily Session Note  Patient Details  Name: Rachel Kennedy MRN: [id number] Date of Birth: 1946-06-21 Referring Provider:     [place] from 06/27/2019 in [place]  Referring Provider  [place]       Encounter Date: 08/04/2019  Check In: Session Check In - 08/04/19 1100      Check-In   Supervising physician immediately available to respond to emergencies  See telemetry face sheet for immediately available MD    Location  [facility]    Staff Present  Russella Dar, MS, EP, [facility], Exercise Physiologist;Axil Copeman Zachery Conch, Exercise Physiologist    Virtual Visit  No    Medication changes reported      No    Fall or balance concerns reported     No    Tobacco Cessation  No Change    Warm-up and Cool-down  Performed as group-led instruction    Resistance Training Performed  Yes    VAD Patient?  No    PAD/SET Patient?  No      Pain Assessment   Currently in Pain?  No/denies    Pain Score  0-No pain    Multiple Pain Sites  No       Capillary Blood Glucose: No results found for this or any previous visit (from the past 24 hour(s)).    Social History   Tobacco Use  Smoking Status Former Smoker  Smokeless Tobacco Never Used  Tobacco Comment   quit in 2017. Smoked for 30 years prior to that.     Goals Met:  Independence with exercise equipment Exercise tolerated well No report of cardiac concerns or symptoms Strength training completed today  Goals Unmet:  Not Applicable  Comments: Pt able to follow exercise prescription today without complaint.  Will continue to monitor for progression. Check out 1200.   Dr. Kate Sable is Medical Director for [facility] Cardiac and Pulmonary Rehab.

## 2019-08-07 ENCOUNTER — Encounter (HOSPITAL_COMMUNITY)
Admission: RE | Admit: 2019-08-07 | Discharge: 2019-08-07 | Disposition: A | Discharge status: Home / Self Care | Payer: Medicare Other | Source: Ambulatory Visit | Attending: Cardiology | Admitting: Cardiology

## 2019-08-07 ENCOUNTER — Other Ambulatory Visit: Payer: Self-pay

## 2019-08-07 DIAGNOSIS — I2102 ST elevation (STEMI) myocardial infarction involving left anterior descending coronary artery: Secondary | ICD-10-CM | POA: Diagnosis not present

## 2019-08-07 DIAGNOSIS — Z955 Presence of coronary angioplasty implant and graft: Secondary | ICD-10-CM

## 2019-08-07 NOTE — Progress Notes (Signed)
Daily Session Note  Patient Details  Name: JEVON LITTLEPAGE MRN: [id number] Date of Birth: 10/21/1946 Referring Provider:     [place] from 06/27/2019 in [place]  Referring Provider  [place]       Encounter Date: 08/07/2019  Check In: Session Check In - 08/07/19 1100      Check-In   Supervising physician immediately available to respond to emergencies  See telemetry face sheet for immediately available MD    Location  [facility]    Staff Present  Russella Dar, MS, EP, [facility], Exercise Physiologist;Austine Kelsay, Exercise Physiologist;Debra Wynetta Emery, RN, BSN    Virtual Visit  No    Medication changes reported      No    Fall or balance concerns reported     No    Tobacco Cessation  No Change    Warm-up and Cool-down  Performed as group-led instruction    Resistance Training Performed  Yes    VAD Patient?  No    PAD/SET Patient?  No      Pain Assessment   Currently in Pain?  No/denies    Pain Score  0-No pain    Multiple Pain Sites  No       Capillary Blood Glucose: No results found for this or any previous visit (from the past 24 hour(s)).    Social History   Tobacco Use  Smoking Status Former Smoker  Smokeless Tobacco Never Used  Tobacco Comment   quit in 2017. Smoked for 30 years prior to that.     Goals Met:  Independence with exercise equipment Exercise tolerated well No report of cardiac concerns or symptoms Strength training completed today  Goals Unmet:  Not Applicable  Comments: Pt able to follow exercise prescription today without complaint.  Will continue to monitor for progression. Check out 1200.   Dr. Kate Sable is Medical Director for [facility] Cardiac and Pulmonary Rehab.

## 2019-08-09 ENCOUNTER — Other Ambulatory Visit: Payer: Self-pay

## 2019-08-09 ENCOUNTER — Encounter (HOSPITAL_COMMUNITY)
Admission: RE | Admit: 2019-08-09 | Discharge: 2019-08-09 | Disposition: A | Discharge status: Home / Self Care | Payer: Medicare Other | Source: Ambulatory Visit | Attending: Cardiology | Admitting: Cardiology

## 2019-08-09 DIAGNOSIS — I2102 ST elevation (STEMI) myocardial infarction involving left anterior descending coronary artery: Secondary | ICD-10-CM

## 2019-08-09 DIAGNOSIS — Z955 Presence of coronary angioplasty implant and graft: Secondary | ICD-10-CM

## 2019-08-09 NOTE — Progress Notes (Signed)
Daily Session Note  Patient Details  Name: Rachel Kennedy MRN: [id number] Date of Birth: September 10, 1946 Referring Provider:     [place] from 06/27/2019 in [place]  Referring Provider  [place]       Encounter Date: 08/09/2019  Check In: Session Check In - 08/09/19 1100      Check-In   Supervising physician immediately available to respond to emergencies  See telemetry face sheet for immediately available MD    Location  [facility]    Staff Present  Russella Dar, MS, EP, [facility], Exercise Physiologist;Adrin Julian, Exercise Physiologist;Debra Wynetta Emery, RN, BSN    Virtual Visit  No    Medication changes reported      No    Fall or balance concerns reported     No    Tobacco Cessation  No Change    Warm-up and Cool-down  Performed as group-led instruction    Resistance Training Performed  Yes    VAD Patient?  No    PAD/SET Patient?  No      Pain Assessment   Currently in Pain?  No/denies    Pain Score  0-No pain    Multiple Pain Sites  No       Capillary Blood Glucose: No results found for this or any previous visit (from the past 24 hour(s)).    Social History   Tobacco Use  Smoking Status Former Smoker  Smokeless Tobacco Never Used  Tobacco Comment   quit in 2017. Smoked for 30 years prior to that.     Goals Met:  Independence with exercise equipment Exercise tolerated well No report of cardiac concerns or symptoms Strength training completed today  Goals Unmet:  Not Applicable  Comments: Pt able to follow exercise prescription today without complaint.  Will continue to monitor for progression. Check out 1200.   Dr. Kate Sable is Medical Director for [facility] Cardiac and Pulmonary Rehab.

## 2019-08-11 ENCOUNTER — Other Ambulatory Visit: Payer: Self-pay

## 2019-08-11 ENCOUNTER — Encounter (HOSPITAL_COMMUNITY)
Admission: RE | Admit: 2019-08-11 | Discharge: 2019-08-11 | Disposition: A | Discharge status: Home / Self Care | Payer: Medicare Other | Source: Ambulatory Visit | Attending: Cardiology | Admitting: Cardiology

## 2019-08-11 DIAGNOSIS — I2102 ST elevation (STEMI) myocardial infarction involving left anterior descending coronary artery: Secondary | ICD-10-CM | POA: Diagnosis not present

## 2019-08-11 DIAGNOSIS — Z955 Presence of coronary angioplasty implant and graft: Secondary | ICD-10-CM

## 2019-08-11 NOTE — Progress Notes (Signed)
Daily Session Note  Patient Details  Name: Rachel Kennedy MRN: [id number] Date of Birth: 15-Nov-1946 Referring Provider:     [place] from 06/27/2019 in [place]  Referring Provider  [place]       Encounter Date: 08/11/2019  Check In: Session Check In - 08/11/19 1146      Check-In   Supervising physician immediately available to respond to emergencies  See telemetry face sheet for immediately available MD    Location  [facility]    Staff Present  Russella Dar, MS, EP, [facility], Exercise Physiologist;Debra Wynetta Emery, RN, [facility] Munch, Exercise Physiologist    Virtual Visit  No    Medication changes reported      No    Fall or balance concerns reported     No    Tobacco Cessation  No Change    Warm-up and Cool-down  Performed as group-led instruction    Resistance Training Performed  Yes    VAD Patient?  No    PAD/SET Patient?  No      Pain Assessment   Currently in Pain?  No/denies    Pain Score  0-No pain    Multiple Pain Sites  No       Capillary Blood Glucose: No results found for this or any previous visit (from the past 24 hour(s)).    Social History   Tobacco Use  Smoking Status Former Smoker  Smokeless Tobacco Never Used  Tobacco Comment   quit in 2017. Smoked for 30 years prior to that.     Goals Met:  Independence with exercise equipment Exercise tolerated well Personal goals reviewed No report of cardiac concerns or symptoms Strength training completed today  Goals Unmet:  Not Applicable  Comments: Check out: 1200  Dr. Kate Sable is Medical Director for [place] and Pulmonary Rehab.

## 2019-08-14 ENCOUNTER — Other Ambulatory Visit: Payer: Self-pay

## 2019-08-14 ENCOUNTER — Encounter (HOSPITAL_COMMUNITY)
Admission: RE | Admit: 2019-08-14 | Discharge: 2019-08-14 | Disposition: A | Discharge status: Home / Self Care | Payer: Medicare Other | Source: Ambulatory Visit | Attending: Cardiology | Admitting: Cardiology

## 2019-08-14 DIAGNOSIS — I2102 ST elevation (STEMI) myocardial infarction involving left anterior descending coronary artery: Secondary | ICD-10-CM

## 2019-08-14 DIAGNOSIS — Z955 Presence of coronary angioplasty implant and graft: Secondary | ICD-10-CM

## 2019-08-14 NOTE — Progress Notes (Signed)
Daily Session Note  Patient Details  Name: ALEICIA KENAGY MRN: [id number] Date of Birth: 08/09/1946 Referring Provider:     [place] from 06/27/2019 in [place]  Referring Provider  [place]       Encounter Date: 08/14/2019  Check In: Session Check In - 08/14/19 1100      Check-In   Supervising physician immediately available to respond to emergencies  See telemetry face sheet for immediately available MD    Location  [facility]    Staff Present  Russella Dar, MS, EP, [facility], Exercise Physiologist;Debra Wynetta Emery, RN, [facility] Munch, Exercise Physiologist    Virtual Visit  No    Medication changes reported      No    Fall or balance concerns reported     No    Tobacco Cessation  No Change    Warm-up and Cool-down  Performed as group-led instruction    Resistance Training Performed  Yes    VAD Patient?  No    PAD/SET Patient?  No      Pain Assessment   Currently in Pain?  No/denies    Pain Score  0-No pain    Multiple Pain Sites  No       Capillary Blood Glucose: No results found for this or any previous visit (from the past 24 hour(s)).    Social History   Tobacco Use  Smoking Status Former Smoker  Smokeless Tobacco Never Used  Tobacco Comment   quit in 2017. Smoked for 30 years prior to that.     Goals Met:  Independence with exercise equipment Exercise tolerated well No report of cardiac concerns or symptoms Strength training completed today  Goals Unmet:  Not Applicable  Comments: Pt able to follow exercise prescription today without complaint.  Will continue to monitor for progression. Check out 1200.   Dr. Kate Sable is Medical Director for [facility] Cardiac and Pulmonary Rehab.

## 2019-08-16 ENCOUNTER — Other Ambulatory Visit: Payer: Self-pay

## 2019-08-16 ENCOUNTER — Encounter (HOSPITAL_COMMUNITY)
Admission: RE | Admit: 2019-08-16 | Discharge: 2019-08-16 | Disposition: A | Discharge status: Home / Self Care | Payer: Medicare Other | Source: Ambulatory Visit | Attending: Cardiology | Admitting: Cardiology

## 2019-08-16 DIAGNOSIS — I2102 ST elevation (STEMI) myocardial infarction involving left anterior descending coronary artery: Secondary | ICD-10-CM | POA: Diagnosis not present

## 2019-08-16 DIAGNOSIS — Z955 Presence of coronary angioplasty implant and graft: Secondary | ICD-10-CM

## 2019-08-16 NOTE — Progress Notes (Signed)
Daily Session Note  Patient Details  Name: Rachel Kennedy MRN: [id number] Date of Birth: 01/17/1946 Referring Provider:     CARDIAC REHAB PHASE II ORIENTATION from 06/27/2019 in [place] [facility]  Referring Provider  [place]       Encounter Date: 08/16/2019  Check In: Session Check In - 08/16/19 1056      Check-In   Supervising physician immediately available to respond to emergencies  See telemetry face sheet for immediately available MD    Location  [facility]    Staff Present  Diane Coad, MS, EP, [facility], Exercise Physiologist;Debra [place], RN, BSN;Amanda Ballard, Exercise Physiologist    Virtual Visit  No    Medication changes reported      No    Fall or balance concerns reported     No    Tobacco Cessation  No Change    Warm-up and Cool-down  Performed as group-led instruction    Resistance Training Performed  Yes    VAD Patient?  No    PAD/SET Patient?  No      Pain Assessment   Currently in Pain?  No/denies    Pain Score  0-No pain    Multiple Pain Sites  No       Capillary Blood Glucose: No results found for this or any previous visit (from the past 24 hour(s)).    Social History   Tobacco Use  Smoking Status Former Smoker  Smokeless Tobacco Never Used  Tobacco Comment   quit in 2017. Smoked for 30 years prior to that.     Goals Met:  Independence with exercise equipment Exercise tolerated well No report of cardiac concerns or symptoms Strength training completed today  Goals Unmet:  Not Applicable  Comments: Pt able to follow exercise prescription today without complaint.  Will continue to monitor for progression. Check out 1200.   Dr. Suresh Koneswaran is Medical Director for [place] Cardiac and Pulmonary Rehab. 

## 2019-08-18 ENCOUNTER — Encounter (HOSPITAL_COMMUNITY)
Admission: RE | Admit: 2019-08-18 | Discharge: 2019-08-18 | Disposition: A | Discharge status: Home / Self Care | Payer: Medicare Other | Source: Ambulatory Visit | Attending: Cardiology | Admitting: Cardiology

## 2019-08-18 ENCOUNTER — Other Ambulatory Visit: Payer: Self-pay

## 2019-08-18 DIAGNOSIS — I2102 ST elevation (STEMI) myocardial infarction involving left anterior descending coronary artery: Secondary | ICD-10-CM | POA: Diagnosis not present

## 2019-08-18 DIAGNOSIS — Z955 Presence of coronary angioplasty implant and graft: Secondary | ICD-10-CM

## 2019-08-18 NOTE — Progress Notes (Signed)
Daily Session Note  Patient Details  Name: KELIAH HARNED MRN: [id number] Date of Birth: Jul 21, 1946 Referring Provider:     [place] from 06/27/2019 in [place]  Referring Provider  [place]       Encounter Date: 08/18/2019  Check In: Session Check In - 08/18/19 1100      Check-In   Supervising physician immediately available to respond to emergencies  See telemetry face sheet for immediately available MD    Location  [facility]    Staff Present  Russella Dar, MS, EP, [facility], Exercise Physiologist;Debra Wynetta Emery, RN, [facility] Munch, Exercise Physiologist    Virtual Visit  No    Medication changes reported      No    Fall or balance concerns reported     No    Tobacco Cessation  No Change    Warm-up and Cool-down  Performed as group-led instruction    Resistance Training Performed  Yes    VAD Patient?  No    PAD/SET Patient?  No      Pain Assessment   Currently in Pain?  No/denies    Pain Score  0-No pain    Multiple Pain Sites  No       Capillary Blood Glucose: No results found for this or any previous visit (from the past 24 hour(s)).    Social History   Tobacco Use  Smoking Status Former Smoker  Smokeless Tobacco Never Used  Tobacco Comment   quit in 2017. Smoked for 30 years prior to that.     Goals Met:  Independence with exercise equipment Exercise tolerated well No report of cardiac concerns or symptoms Strength training completed today  Goals Unmet:  Not Applicable  Comments: Pt able to follow exercise prescription today without complaint.  Will continue to monitor for progression. Check out 1200.   Dr. Kate Sable is Medical Director for [facility] Cardiac and Pulmonary Rehab.

## 2019-08-21 ENCOUNTER — Other Ambulatory Visit: Payer: Self-pay

## 2019-08-21 ENCOUNTER — Encounter (HOSPITAL_COMMUNITY)
Admission: RE | Admit: 2019-08-21 | Discharge: 2019-08-21 | Disposition: A | Discharge status: Home / Self Care | Payer: Medicare Other | Source: Ambulatory Visit | Attending: Cardiology | Admitting: Cardiology

## 2019-08-21 DIAGNOSIS — I2102 ST elevation (STEMI) myocardial infarction involving left anterior descending coronary artery: Secondary | ICD-10-CM | POA: Diagnosis not present

## 2019-08-21 DIAGNOSIS — Z955 Presence of coronary angioplasty implant and graft: Secondary | ICD-10-CM

## 2019-08-21 NOTE — Progress Notes (Signed)
Daily Session Note  Patient Details  Name: CRISS BARTLES MRN: [id number] Date of Birth: 1946/08/28 Referring Provider:     [place] from 06/27/2019 in [place]  Referring Provider  [place]       Encounter Date: 08/21/2019  Check In: Session Check In - 08/21/19 1100      Check-In   Supervising physician immediately available to respond to emergencies  See telemetry face sheet for immediately available MD    Location  [facility]    Staff Present  Russella Dar, MS, EP, [facility], Exercise Physiologist;Debra Wynetta Emery, RN, [facility] Munch, Exercise Physiologist    Virtual Visit  No    Medication changes reported      No    Fall or balance concerns reported     No    Tobacco Cessation  No Change    Warm-up and Cool-down  Performed as group-led instruction    Resistance Training Performed  Yes    VAD Patient?  No    PAD/SET Patient?  No      Pain Assessment   Currently in Pain?  No/denies    Pain Score  0-No pain    Multiple Pain Sites  No       Capillary Blood Glucose: No results found for this or any previous visit (from the past 24 hour(s)).    Social History   Tobacco Use  Smoking Status Former Smoker  Smokeless Tobacco Never Used  Tobacco Comment   quit in 2017. Smoked for 30 years prior to that.     Goals Met:  Independence with exercise equipment Exercise tolerated well No report of cardiac concerns or symptoms Strength training completed today  Goals Unmet:  Not Applicable  Comments: Pt able to follow exercise prescription today without complaint.  Will continue to monitor for progression. Check out 1200.   Dr. Kate Sable is Medical Director for [facility] Cardiac and Pulmonary Rehab.

## 2019-08-22 NOTE — Progress Notes (Addendum)
Cardiac Individual Treatment Plan  Patient Details  Name: Rachel Kennedy MRN: [id number] Date of Birth: 05/12/1946 Referring Provider:     [place] from 06/27/2019 in [place]  Referring Provider  [place]       Initial Encounter Date:    CARDIAC REHAB PHASE II ORIENTATION from 06/27/2019 in [place]  Date  06/27/19      Visit Diagnosis: ST elevation myocardial infarction involving left anterior descending (LAD) coronary artery [facility])  Status post coronary artery stent placement  Patient's Home Medications on Admission:  Current Outpatient Medications:  .  acetaminophen (TYLENOL) 500 MG tablet, Take 500 mg by mouth every 6 (six) hours as needed for headache (pain)., Disp: , Rfl:  .  aspirin EC 81 MG tablet, Take 1 tablet (81 mg total) by mouth daily., Disp: , Rfl:  .  atorvastatin (LIPITOR) 80 MG tablet, TAKE 1 TABLET (80 MG TOTAL) BY MOUTH DAILY AT 6 PM., Disp: 90 tablet, Rfl: 0 .  buPROPion (WELLBUTRIN XL) 300 MG 24 hr tablet, Take 300 mg by mouth daily., Disp: , Rfl: 1 .  carvedilol (COREG) 6.25 MG tablet, TAKE 1 TABLET (6.25 MG TOTAL) BY MOUTH 2 (TWO) TIMES DAILY WITH A MEAL., Disp: 180 tablet, Rfl: 1 .  cycloSPORINE (RESTASIS) 0.05 % ophthalmic emulsion, Place 1 drop into both eyes 2 (two) times daily., Disp: , Rfl:  .  doxycycline (PERIOSTAT) 20 MG tablet, Take 20 mg by mouth 2 (two) times daily., Disp: , Rfl: 2 .  losartan (COZAAR) 25 MG tablet, TAKE 1 TABLET BY MOUTH EVERY DAY, Disp: 90 tablet, Rfl: 0 .  nitroGLYCERIN (NITROSTAT) 0.4 MG SL tablet, Place 1 tablet (0.4 mg total) under the tongue every 5 (five) minutes x 3 doses as needed for chest pain., Disp: 25 tablet, Rfl: 1 .  PRESCRIPTION MEDICATION, Apply 1 application topically daily as needed (arthritis pain). Arthritis cream compounded at [facility]   Diclofenac 3% Baclofen 2% gabapentin 5% Lidocaine 5% Menthol 1%, Disp: , Rfl:  .   sertraline (ZOLOFT) 100 MG tablet, Take 100 mg by mouth at bedtime. , Disp: , Rfl:  .  ticagrelor (BRILINTA) 90 MG TABS tablet, Take 1 tablet (90 mg total) by mouth 2 (two) times daily., Disp: 180 tablet, Rfl: 2 .  zolpidem (AMBIEN) 10 MG tablet, Take 10 mg by mouth at bedtime as needed for sleep. , Disp: , Rfl:   Past Medical History: Past Medical History:  Diagnosis Date  . Acute systolic (congestive) heart failure ([place])   . CAD (coronary artery disease) 05/07/2019  . Depression 05/07/2019  . Hyperlipidemia   . Hypertension   . STEMI (ST elevation myocardial infarction) ([place])     Tobacco Use: Social History   Tobacco Use  Smoking Status Former Smoker  Smokeless Tobacco Never Used  Tobacco Comment   quit in 2017. Smoked for 30 years prior to that.     Labs: Recent Review Flowsheet Data    Labs for ITP Cardiac and Pulmonary Rehab Latest Ref Rng & Units 02/09/2019 02/10/2019 04/10/2019   Cholestrol 0 - 200 mg/dL - 194 133   LDLCALC 0 - 99 mg/dL - 126(H) 61   HDL >40 mg/dL - 37(L) 52   Trlycerides <150 mg/dL - 156(H) 100   Hemoglobin A1c 4.8 - 5.6 % 5.8(H) - -      Capillary Blood Glucose: No results found for: GLUCAP   Exercise Target Goals: Exercise Program Goal: Individual exercise  prescription set using results from initial 6 min walk test and THRR while considering  patient's activity barriers and safety.   Exercise Prescription Goal: Starting with aerobic activity 30 plus minutes a day, 3 days per week for initial exercise prescription. Provide home exercise prescription and guidelines that participant acknowledges understanding prior to discharge.  Activity Barriers & Risk Stratification: Activity Barriers & Cardiac Risk Stratification - 06/27/19 1406      Activity Barriers & Cardiac Risk Stratification   Cardiac Risk Stratification  High       6 Minute Walk: 6 Minute Walk    Row Name 06/27/19 1405         6 Minute Walk   Phase  Initial     Distance   1400 feet     Walk Time  6 minutes     # of Rest Breaks  0     MPH  2.6     METS  3     RPE  10     Perceived Dyspnea   10     VO2 Peak  10.01     Symptoms  No     Resting HR  58 bpm     Resting BP  150/72     Resting Oxygen Saturation   94 %     Exercise Oxygen Saturation  during 6 min walk  92 %     Max Ex. HR  83 bpm     Max Ex. BP  156/78     2 Minute Post BP  146/70        Oxygen Initial Assessment:   Oxygen Re-Evaluation:   Oxygen Discharge (Final Oxygen Re-Evaluation):   Initial Exercise Prescription: Initial Exercise Prescription - 06/27/19 1400      Date of Initial Exercise RX and Referring Provider   Date  06/27/19    Referring Provider  [place]     Expected Discharge Date  09/27/19      Treadmill   MPH  2    Grade  0    Minutes  17    METs  2.9      NuStep   Level  1    SPM  50    Minutes  17    METs  2      Prescription Details   Frequency (times per week)  3    Duration  Progress to 30 minutes of continuous aerobic without signs/symptoms of physical distress      Intensity   THRR 40-80% of Max Heartrate  36-54-72    Ratings of Perceived Exertion  11-13    Perceived Dyspnea  0-4      Progression   Progression  Continue to progress workloads to maintain intensity without signs/symptoms of physical distress.      Resistance Training   Training Prescription  Yes    Weight  1    Reps  10-15       Perform Capillary Blood Glucose checks as needed.  Exercise Prescription Changes:  Exercise Prescription Changes    Row Name 07/20/19 1300 08/01/19 0900 08/22/19 0800         Response to Exercise   Blood Pressure (Admit)  124/64  134/70  140/80     Blood Pressure (Exercise)  138/62  148/82  152/76     Blood Pressure (Exit)  120/64  122/70  128/80     Heart Rate (Admit)  60 bpm  59 bpm  61 bpm  Heart Rate (Exercise)  82 bpm  78 bpm  87 bpm     Heart Rate (Exit)  69 bpm  64 bpm  69 bpm     Rating of Perceived Exertion (Exercise)  '11   11  11     ' Comments  first two weeks of exercise   -  increase in overall MET level      Duration  Continue with 30 min of aerobic exercise without signs/symptoms of physical distress.  Continue with 30 min of aerobic exercise without signs/symptoms of physical distress.  Continue with 30 min of aerobic exercise without signs/symptoms of physical distress.     Intensity  THRR New [phone]  THRR unchanged  THRR unchanged       Progression   Progression  Continue to progress workloads to maintain intensity without signs/symptoms of physical distress.  Continue to progress workloads to maintain intensity without signs/symptoms of physical distress.  Continue to progress workloads to maintain intensity without signs/symptoms of physical distress.     Average METs  2.7  2.55  3.5       Resistance Training   Training Prescription  Yes  Yes  Yes     Weight  '2  2  3     ' Reps  10-15  10-15  10-15       Treadmill   MPH  - couldnt get gait correct   -  -       NuStep   Level  '2  2  3     ' SPM  95  97  108     Minutes  '17  17  17     ' METs  2.1  2  2.6       Recumbant Elliptical   Level  '2  2  3     ' RPM  50  49  56     Watts  54  72  64     Minutes  '22  22  22     ' METs  3.3  3.1  4.4        Exercise Comments:  Exercise Comments    Row Name 07/10/19 1320 08/01/19 0946 08/22/19 0836       Exercise Comments  Pt. is still new to the program, she has attended 7 sessions. She is working on the [place]. She has tolerated the exercise well and already started to increase her workloads. We will progress her as needed.  Pt. has now attended 16 exercise sessions. Each session she comes willing to work hard and increase her personal output on the equipment. She has tolerated the exercise well.  Pt. has attended 25 sessions. She is here regularly and says this has been very good for her and is helping her to increase her strength.        Exercise Goals and Review:  Exercise  Goals    Row Name 06/27/19 1407             Exercise Goals   Increase Physical Activity  Yes       Intervention  Develop an individualized exercise prescription for aerobic and resistive training based on initial evaluation findings, risk stratification, comorbidities and participant's personal goals.       Expected Outcomes  Long Term: Add in home exercise to make exercise part of routine and to increase amount of physical activity.;Short Term: Attend rehab on a regular basis to increase amount of physical activity.;Long Term:  Exercising regularly at least 3-5 days a week.       Increase Strength and Stamina  Yes       Intervention  Develop an individualized exercise prescription for aerobic and resistive training based on initial evaluation findings, risk stratification, comorbidities and participant's personal goals.       Expected Outcomes  Short Term: Increase workloads from initial exercise prescription for resistance, speed, and METs.;Short Term: Perform resistance training exercises routinely during rehab and add in resistance training at home;Long Term: Improve cardiorespiratory fitness, muscular endurance and strength as measured by increased METs and functional capacity (6MWT)       Able to understand and use rate of perceived exertion (RPE) scale  Yes       Intervention  Provide education and explanation on how to use RPE scale       Expected Outcomes  Short Term: Able to use RPE daily in rehab to express subjective intensity level;Long Term:  Able to use RPE to guide intensity level when exercising independently       Knowledge and understanding of Target Heart Rate Range (THRR)  Yes       Intervention  Provide education and explanation of THRR including how the numbers were predicted and where they are located for reference       Expected Outcomes  Short Term: Able to state/look up THRR;Long Term: Able to use THRR to govern intensity when exercising independently;Short Term: Able to  use daily as guideline for intensity in rehab       Able to check pulse independently  Yes       Intervention  Provide education and demonstration on how to check pulse in carotid and radial arteries.;Review the importance of being able to check your own pulse for safety during independent exercise       Expected Outcomes  Short Term: Able to explain why pulse checking is important during independent exercise;Long Term: Able to check pulse independently and accurately       Understanding of Exercise Prescription  Yes       Intervention  Provide education, explanation, and written materials on patient's individual exercise prescription       Expected Outcomes  Short Term: Able to explain program exercise prescription;Long Term: Able to explain home exercise prescription to exercise independently          Exercise Goals Re-Evaluation : Exercise Goals Re-Evaluation    Row Name 07/10/19 1317 08/01/19 0945 08/22/19 0835         Exercise Goal Re-Evaluation   Exercise Goals Review  Increase Physical Activity;Increase Strength and Stamina;Able to understand and use rate of perceived exertion (RPE) scale;Knowledge and understanding of Target Heart Rate Range (THRR);Able to check pulse independently;Understanding of Exercise Prescription  Increase Physical Activity;Increase Strength and Stamina;Able to understand and use rate of perceived exertion (RPE) scale;Knowledge and understanding of Target Heart Rate Range (THRR);Able to check pulse independently;Understanding of Exercise Prescription  Increase Physical Activity;Increase Strength and Stamina;Able to understand and use rate of perceived exertion (RPE) scale;Knowledge and understanding of Target Heart Rate Range (THRR);Able to check pulse independently;Understanding of Exercise Prescription     Comments  Pt. is new to the program. She has attended regularly and says this has helped her to get back to routine activity.  Pt. has done excellent in the  program so far. She attends consistantly in order to help her reach her goals. She works hard to go farther distances on both machines every class.  Pt. continues to attend CR regularly. She works hard to increase her distance on both pieces of equipment.     Expected Outcomes  Short: increase overall activity levels. Long: increase overall heart health.  Short: increase overall activity levels. Long: increase overall heart health.  Short: increase overall activity levels. Long: increase overall heart health.         Discharge Exercise Prescription (Final Exercise Prescription Changes): Exercise Prescription Changes - 08/22/19 0800      Response to Exercise   Blood Pressure (Admit)  140/80    Blood Pressure (Exercise)  152/76    Blood Pressure (Exit)  128/80    Heart Rate (Admit)  61 bpm    Heart Rate (Exercise)  87 bpm    Heart Rate (Exit)  69 bpm    Rating of Perceived Exertion (Exercise)  11    Comments  increase in overall MET level     Duration  Continue with 30 min of aerobic exercise without signs/symptoms of physical distress.    Intensity  THRR unchanged      Progression   Progression  Continue to progress workloads to maintain intensity without signs/symptoms of physical distress.    Average METs  3.5      Resistance Training   Training Prescription  Yes    Weight  3    Reps  10-15      NuStep   Level  3    SPM  108    Minutes  17    METs  2.6      Recumbant Elliptical   Level  3    RPM  56    Watts  64    Minutes  22    METs  4.4       Nutrition:  Target Goals: Understanding of nutrition guidelines, daily intake of sodium <1531m, cholesterol <2061m calories 30% from fat and 7% or less from saturated fats, daily to have 5 or more servings of fruits and vegetables.  Biometrics: Pre Biometrics - 06/27/19 1408      Pre Biometrics   Height  '5\' 6"'  (1.676 m)    Weight  83.7 kg    Waist Circumference  37 inches    Hip Circumference  48 inches    Waist to  Hip Ratio  0.77 %    BMI (Calculated)  29.8    Triceps Skinfold  14 mm    % Body Fat  37.4 %    Grip Strength  3.4 kg    Single Leg Stand  4 seconds        Nutrition Therapy Plan and Nutrition Goals: Nutrition Therapy & Goals - 08/22/19 1221      Personal Nutrition Goals   Comments  The RD meeting has not resumed post COVID-19. We are working on resuming these classes safely. We are also working with a new dietician. In the interim, we are providing education through hand-outs and one-on-one teaching. Patient says she is eating smaller portions and eating more vegetables and only healthy snacks. Will continue to monitor for progress.      Intervention Plan   Intervention  Nutrition handout(s) given to patient.       Nutrition Assessments: Nutrition Assessments - 06/27/19 1511      Rate Your Plate Scores   Pre Score  59       Nutrition Goals Re-Evaluation:   Nutrition Goals Discharge (Final Nutrition Goals Re-Evaluation):   Psychosocial: Target Goals: Acknowledge presence or absence  of significant depression and/or stress, maximize coping skills, provide positive support system. Participant is able to verbalize types and ability to use techniques and skills needed for reducing stress and depression.  Initial Review & Psychosocial Screening: Initial Psych Review & Screening - 06/27/19 1455      Initial Review   Current issues with  None Identified      Family Dynamics   Good Support System?  Yes      Barriers   Psychosocial barriers to participate in program  There are no identifiable barriers or psychosocial needs.      Screening Interventions   Interventions  Encouraged to exercise;Provide feedback about the scores to participant    Expected Outcomes  Long Term goal: The participant improves quality of Life and PHQ9 Scores as seen by post scores and/or verbalization of changes;Short Term goal: Identification and review with participant of any Quality of Life or  Depression concerns found by scoring the questionnaire.       Quality of Life Scores: Quality of Life - 06/27/19 1409      Quality of Life   Select  Quality of Life      Quality of Life Scores   Health/Function Pre  22.37 %    Socioeconomic Pre  21.83 %    Psych/Spiritual Pre  20.36 %    Family Pre  19.5 %    GLOBAL Pre  21.47 %      Scores of 19 and below usually indicate a poorer quality of life in these areas.  A difference of  2-3 points is a clinically meaningful difference.  A difference of 2-3 points in the total score of the Quality of Life Index has been associated with significant improvement in overall quality of life, self-image, physical symptoms, and general health in studies assessing change in quality of life.  PHQ-9: Recent Review Flowsheet Data    Depression screen [facility] 2/9 06/27/2019   Decreased Interest 0   Down, Depressed, Hopeless 0   PHQ - 2 Score 0   Altered sleeping 1   Tired, decreased energy 1   Change in appetite 0   Feeling bad or failure about yourself  0   Trouble concentrating 0   Moving slowly or fidgety/restless 0   Suicidal thoughts 0   PHQ-9 Score 2   Difficult doing work/chores Not difficult at all     Interpretation of Total Score  Total Score Depression Severity:  1-4 = Minimal depression, 5-9 = Mild depression, 10-14 = Moderate depression, 15-19 = Moderately severe depression, 20-27 = Severe depression   Psychosocial Evaluation and Intervention: Psychosocial Evaluation - 06/27/19 1509      Psychosocial Evaluation & Interventions   Interventions  Encouraged to exercise with the program and follow exercise prescription;Stress management education;Relaxation education    Comments  Patient's initial PHQ-9 score was 3 and her QOL score was 21.47 with no psychosocial issues identified.    Expected Outcomes  Patient will have no psychosocial issued identified at discharge.    Continue Psychosocial Services   No Follow up required        Psychosocial Re-Evaluation: Psychosocial Re-Evaluation    Manuel Garcia Name 07/10/19 1506 08/01/19 1445 08/22/19 1224         Psychosocial Re-Evaluation   Current issues with  None Identified  None Identified  None Identified     Comments  Patient's initial QOL score was 21.47 and her PHQ-9 score was 2 with no psychosocial issues identified. Will continue to  monitor for progress.  Patient's initial QOL score was 21.47 and her PHQ-9 score was 2 with no psychosocial issues identified. Will continue to monitor for progress.  Patient's initial QOL score was 21.47 and her PHQ-9 score was 2 with no psychosocial issues identified. Will continue to monitor for progress.     Expected Outcomes  Patient will have no psychosocial issues identified at discharge.  Patient will have no psychosocial issues identified at discharge.  Patient will have no psychosocial issues identified at discharge.     Interventions  Stress management education;Encouraged to attend Cardiac Rehabilitation for the exercise;Relaxation education  Stress management education;Encouraged to attend Cardiac Rehabilitation for the exercise;Relaxation education  Stress management education;Encouraged to attend Cardiac Rehabilitation for the exercise;Relaxation education     Continue Psychosocial Services   No Follow up required  No Follow up required  No Follow up required        Psychosocial Discharge (Final Psychosocial Re-Evaluation): Psychosocial Re-Evaluation - 08/22/19 1224      Psychosocial Re-Evaluation   Current issues with  None Identified    Comments  Patient's initial QOL score was 21.47 and her PHQ-9 score was 2 with no psychosocial issues identified. Will continue to monitor for progress.    Expected Outcomes  Patient will have no psychosocial issues identified at discharge.    Interventions  Stress management education;Encouraged to attend Cardiac Rehabilitation for the exercise;Relaxation education    Continue Psychosocial  Services   No Follow up required       Vocational Rehabilitation: Provide vocational rehab assistance to qualifying candidates.   Vocational Rehab Evaluation & Intervention: Vocational Rehab - 06/27/19 1514      Initial Vocational Rehab Evaluation & Intervention   Assessment shows need for Vocational Rehabilitation  No      Vocational Rehab Re-Evaulation   Comments  Patient is a retired Education officer, museum.       Education: Education Goals: Education classes will be provided on a weekly basis, covering required topics. Participant will state understanding/return demonstration of topics presented.  Learning Barriers/Preferences: Learning Barriers/Preferences - 06/27/19 1513      Learning Barriers/Preferences   Learning Barriers  None    Learning Preferences  Written Material;Skilled Demonstration       Education Topics: Hypertension, Hypertension Reduction -Define heart disease and high blood pressure. Discus how high blood pressure affects the body and ways to reduce high blood pressure.   Exercise and Your Heart -Discuss why it is important to exercise, the FITT principles of exercise, normal and abnormal responses to exercise, and how to exercise safely.   CARDIAC REHAB PHASE II EXERCISE from 08/16/2019 in [place]  Date  06/28/19  Educator  D. Coad  Instruction Review Code  2- Demonstrated Understanding      Angina -Discuss definition of angina, causes of angina, treatment of angina, and how to decrease risk of having angina.   CARDIAC REHAB PHASE II EXERCISE from 08/16/2019 in [place]  Date  07/05/19  Educator  Etheleen Mayhew  Instruction Review Code  2- Demonstrated Understanding      Cardiac Medications -Review what the following cardiac medications are used for, how they affect the body, and side effects that may occur when taking the medications.  Medications include Aspirin, Beta blockers, calcium channel blockers,  ACE Inhibitors, angiotensin receptor blockers, diuretics, digoxin, and antihyperlipidemics.   CARDIAC REHAB PHASE II EXERCISE from 08/16/2019 in [place]  Date  07/12/19  Educator  Wynetta Emery  Instruction Review Code  2- Demonstrated Understanding      Congestive Heart Failure -Discuss the definition of CHF, how to live with CHF, the signs and symptoms of CHF, and how keep track of weight and sodium intake.   CARDIAC REHAB PHASE II EXERCISE from 08/16/2019 in [place]  Date  07/19/19  Educator  DC  Instruction Review Code  2- Demonstrated Understanding      Heart Disease and Intimacy -Discus the effect sexual activity has on the heart, how changes occur during intimacy as we age, and safety during sexual activity.   CARDIAC REHAB PHASE II EXERCISE from 08/16/2019 in [place]  Date  08/02/19  Educator  DC   Instruction Review Code  2- Demonstrated Understanding      Smoking Cessation / COPD -Discuss different methods to quit smoking, the health benefits of quitting smoking, and the definition of COPD.   CARDIAC REHAB PHASE II EXERCISE from 08/16/2019 in [place]  Date  08/09/19  Educator  DJ  Instruction Review Code  2- Demonstrated Understanding      Nutrition I: Fats -Discuss the types of cholesterol, what cholesterol does to the heart, and how cholesterol levels can be controlled.   CARDIAC REHAB PHASE II EXERCISE from 08/16/2019 in [place]  Date  08/16/19  Educator  DC  Instruction Review Code  2- Demonstrated Understanding      Nutrition II: Labels -Discuss the different components of food labels and how to read food label   Heart Parts/Heart Disease and PAD -Discuss the anatomy of the heart, the pathway of blood circulation through the heart, and these are affected by heart disease.   Stress I: Signs and Symptoms -Discuss the causes of stress,  how stress may lead to anxiety and depression, and ways to limit stress.   Stress II: Relaxation -Discuss different types of relaxation techniques to limit stress.   Warning Signs of Stroke / TIA -Discuss definition of a stroke, what the signs and symptoms are of a stroke, and how to identify when someone is having stroke.   Knowledge Questionnaire Score: Knowledge Questionnaire Score - 06/27/19 1514      Knowledge Questionnaire Score   Pre Score  20/24       Core Components/Risk Factors/Patient Goals at Admission: Personal Goals and Risk Factors at Admission - 06/27/19 1514      Core Components/Risk Factors/Patient Goals on Admission    Weight Management  Obesity    Improve shortness of breath with ADL's  Yes    Intervention  Provide education, individualized exercise plan and daily activity instruction to help decrease symptoms of SOB with activities of daily living.    Expected Outcomes  Short Term: Improve cardiorespiratory fitness to achieve a reduction of symptoms when performing ADLs    Personal Goal Other  Yes    Personal Goal  Get in better shape and get heart healthy. Learn about heart health and heart disease.    Intervention  Attend CR 3 days/week and supplement with exercise at home 2 days/week.    Expected Outcomes  Patient will meet her personal goals.       Core Components/Risk Factors/Patient Goals Review:  Goals and Risk Factor Review    Row Name 07/10/19 1504 08/01/19 1446 08/22/19 1222         Core Components/Risk Factors/Patient Goals Review   Personal Goals Review  Weight Management/Obesity;Improve shortness of breath with ADL's;Other Get in shape  and get heart healthy.  Weight Management/Obesity;Improve shortness of breath with ADL's;Other Get in shape and get heart healty.  Weight Management/Obesity;Improve shortness of breath with ADL's;Other Get in shape and get heart healthy.     Review  Patient has completed 7 sessions maintaining her weight since  she started the program. She says her muscles feels sore the next day which she feels is a sign she is working hard and the program is benefiting her. She hopes that as she continues she will get in shape and heart healthy. Will continue to monitor for progress.  Patient has completed 16 sessions losing 1 lb since her initial visit. She is doing well in the program with progression. She says she is feeling stronger and better overall. She feels like she is getting in shape. She says she enjoys coming to the program. Will continue to monitor for progress.  Patient has completed 25 sessions losing 1 lb since last 30 day review. She continues to do well in the program with progression. She says the program is helping her have more purpose and she feels stronger and healthier. She feels like the program motivates her to eat healthier and to have a regular exercise routine. She feels the program is helping her work toward meeting her personal goals.     Expected Outcomes  Patient will continue to attend sessions and complete the program meeting her personal goals.  Patient will continue to attend sessions and complete the program meeting her personal goals.  Patient will continue to attend sessions and complete the program meeting her personal goals.        Core Components/Risk Factors/Patient Goals at Discharge (Final Review):  Goals and Risk Factor Review - 08/22/19 1222      Core Components/Risk Factors/Patient Goals Review   Personal Goals Review  Weight Management/Obesity;Improve shortness of breath with ADL's;Other   Get in shape and get heart healthy.   Review  Patient has completed 25 sessions losing 1 lb since last 30 day review. She continues to do well in the program with progression. She says the program is helping her have more purpose and she feels stronger and healthier. She feels like the program motivates her to eat healthier and to have a regular exercise routine. She feels the program is  helping her work toward meeting her personal goals.    Expected Outcomes  Patient will continue to attend sessions and complete the program meeting her personal goals.       ITP Comments:   Comments: ITP REVIEW Patient is doing well in the program. Will continue to monitor for progress.

## 2019-08-23 ENCOUNTER — Other Ambulatory Visit: Payer: Self-pay

## 2019-08-23 ENCOUNTER — Encounter (HOSPITAL_COMMUNITY)
Admission: RE | Admit: 2019-08-23 | Discharge: 2019-08-23 | Disposition: A | Discharge status: Home / Self Care | Payer: Medicare Other | Source: Ambulatory Visit | Attending: Cardiology | Admitting: Cardiology

## 2019-08-23 DIAGNOSIS — Z955 Presence of coronary angioplasty implant and graft: Secondary | ICD-10-CM | POA: Diagnosis present

## 2019-08-23 DIAGNOSIS — I2102 ST elevation (STEMI) myocardial infarction involving left anterior descending coronary artery: Secondary | ICD-10-CM | POA: Insufficient documentation

## 2019-08-23 NOTE — Progress Notes (Signed)
Daily Session Note  Patient Details  Name: ROMONA MURDY MRN: [id number] Date of Birth: 1946-07-09 Referring Provider:     Register from 06/27/2019 in [place]  Referring Provider  [place]       Encounter Date: 08/23/2019  Check In: Session Check In - 08/23/19 1100      Check-In   Supervising physician immediately available to respond to emergencies  See telemetry face sheet for immediately available MD    Location  [facility]    Staff Present  Russella Dar, MS, EP, [facility], Exercise Physiologist;Debra Wynetta Emery, RN, Cory Munch, Exercise Physiologist    Virtual Visit  No    Medication changes reported      No    Fall or balance concerns reported     No    Tobacco Cessation  No Change    Warm-up and Cool-down  Performed as group-led instruction    Resistance Training Performed  Yes    VAD Patient?  No    PAD/SET Patient?  No      Pain Assessment   Currently in Pain?  No/denies    Pain Score  0-No pain    Multiple Pain Sites  No       Capillary Blood Glucose: No results found for this or any previous visit (from the past 24 hour(s)).    Social History   Tobacco Use  Smoking Status Former Smoker  Smokeless Tobacco Never Used  Tobacco Comment   quit in 2017. Smoked for 30 years prior to that.     Goals Met:  Independence with exercise equipment Exercise tolerated well No report of cardiac concerns or symptoms Strength training completed today  Goals Unmet:  Not Applicable  Comments: Pt able to follow exercise prescription today without complaint.  Will continue to monitor for progression. Check out 1200.   Dr. Kate Sable is Medical Director for [facility] Cardiac and Pulmonary Rehab.

## 2019-08-25 ENCOUNTER — Other Ambulatory Visit: Payer: Self-pay

## 2019-08-25 ENCOUNTER — Encounter (HOSPITAL_COMMUNITY)
Admission: RE | Admit: 2019-08-25 | Discharge: 2019-08-25 | Disposition: A | Discharge status: Home / Self Care | Payer: Medicare Other | Source: Ambulatory Visit | Attending: Cardiology | Admitting: Cardiology

## 2019-08-25 DIAGNOSIS — I2102 ST elevation (STEMI) myocardial infarction involving left anterior descending coronary artery: Secondary | ICD-10-CM | POA: Diagnosis not present

## 2019-08-25 DIAGNOSIS — Z955 Presence of coronary angioplasty implant and graft: Secondary | ICD-10-CM

## 2019-08-25 NOTE — Progress Notes (Signed)
Daily Session Note  Patient Details  Name: Rachel Kennedy MRN: [id number] Date of Birth: 05/11/1946 Referring Provider:     CARDIAC REHAB PHASE II ORIENTATION from 06/27/2019 in [place] [facility]  Referring Provider  [place]       Encounter Date: 08/25/2019  Check In: Session Check In - 08/25/19 1100      Check-In   Supervising physician immediately available to respond to emergencies  See telemetry face sheet for immediately available ER MD    Location  [facility]    Staff Present  Diane Coad, MS, EP, [facility], Exercise Physiologist;Debra Johnson, RN, BSN;Amanda Ballard, Exercise Physiologist    Virtual Visit  No    Medication changes reported      No    Fall or balance concerns reported     No    Tobacco Cessation  No Change    Warm-up and Cool-down  Performed as group-led instruction    Resistance Training Performed  Yes    VAD Patient?  No    PAD/SET Patient?  No      Pain Assessment   Currently in Pain?  No/denies    Pain Score  0-No pain    Multiple Pain Sites  No       Capillary Blood Glucose: No results found for this or any previous visit (from the past 24 hour(s)).    Social History   Tobacco Use  Smoking Status Former Smoker  Smokeless Tobacco Never Used  Tobacco Comment   quit in 2017. Smoked for 30 years prior to that.     Goals Met:  Independence with exercise equipment Exercise tolerated well No report of cardiac concerns or symptoms Strength training completed today  Goals Unmet:  Not Applicable  Comments: Pt able to follow exercise prescription today without complaint.  Will continue to monitor for progression. Check out 1200.   Dr. Suresh Koneswaran is Medical Director for [facility] Card[facility] and Pulmonary Rehab. 

## 2019-08-26 ENCOUNTER — Other Ambulatory Visit: Payer: Self-pay | Admitting: Cardiology

## 2019-08-28 ENCOUNTER — Encounter (HOSPITAL_COMMUNITY): Payer: Medicare Other

## 2019-08-30 ENCOUNTER — Other Ambulatory Visit: Payer: Self-pay

## 2019-08-30 ENCOUNTER — Encounter (HOSPITAL_COMMUNITY)
Admission: RE | Admit: 2019-08-30 | Discharge: 2019-08-30 | Disposition: A | Discharge status: Home / Self Care | Payer: Medicare Other | Source: Ambulatory Visit | Attending: Cardiology | Admitting: Cardiology

## 2019-08-30 DIAGNOSIS — Z955 Presence of coronary angioplasty implant and graft: Secondary | ICD-10-CM

## 2019-08-30 DIAGNOSIS — I2102 ST elevation (STEMI) myocardial infarction involving left anterior descending coronary artery: Secondary | ICD-10-CM | POA: Diagnosis not present

## 2019-08-30 NOTE — Progress Notes (Signed)
Daily Session Note  Patient Details  Name: Rachel Kennedy MRN: [id number] Date of Birth: [birth year] Referring Provider:     [place] from 06/27/2019 in [place]  Referring Provider  [place]       Encounter Date: 08/30/2019  Check In: Session Check In - 08/30/19 1100      Check-In   Supervising physician immediately available to respond to emergencies  See telemetry face sheet for immediately available MD    Location  [facility]    Staff Present  Benay Pike, Exercise Physiologist;Debra Wynetta Emery, RN, BSN    Virtual Visit  No    Medication changes reported      No    Fall or balance concerns reported     No    Tobacco Cessation  No Change    Warm-up and Cool-down  Performed as group-led instruction    Resistance Training Performed  Yes    VAD Patient?  No    PAD/SET Patient?  No      Pain Assessment   Currently in Pain?  No/denies    Pain Score  0-No pain    Multiple Pain Sites  No       Capillary Blood Glucose: No results found for this or any previous visit (from the past 24 hour(s)).    Social History   Tobacco Use  Smoking Status Former Smoker  Smokeless Tobacco Never Used  Tobacco Comment   quit in 2017. Smoked for 30 years prior to that.     Goals Met:  Independence with exercise equipment Exercise tolerated well No report of cardiac concerns or symptoms Strength training completed today  Goals Unmet:  Not Applicable  Comments: Pt able to follow exercise prescription today without complaint.  Will continue to monitor for progression. Check out 1200.   Dr. Kate Sable is Medical Director for [facility] Cardiac and Pulmonary Rehab.

## 2019-09-01 ENCOUNTER — Other Ambulatory Visit: Payer: Self-pay | Admitting: Cardiology

## 2019-09-01 ENCOUNTER — Other Ambulatory Visit: Payer: Self-pay

## 2019-09-01 ENCOUNTER — Encounter (HOSPITAL_COMMUNITY)
Admission: RE | Admit: 2019-09-01 | Discharge: 2019-09-01 | Disposition: A | Discharge status: Home / Self Care | Payer: Medicare Other | Source: Ambulatory Visit | Attending: Cardiology | Admitting: Cardiology

## 2019-09-01 DIAGNOSIS — I2102 ST elevation (STEMI) myocardial infarction involving left anterior descending coronary artery: Secondary | ICD-10-CM

## 2019-09-01 DIAGNOSIS — Z955 Presence of coronary angioplasty implant and graft: Secondary | ICD-10-CM

## 2019-09-01 NOTE — Progress Notes (Signed)
Daily Session Note  Patient Details  Name: Rachel Kennedy MRN: [id number] Date of Birth: 05/12/1946 Referring Provider:     [place] from 06/27/2019 in [place]  Referring Provider  [place]       Encounter Date: 09/01/2019  Check In: Session Check In - 09/01/19 1100      Check-In   Supervising physician immediately available to respond to emergencies  See telemetry face sheet for immediately available MD    Location  [facility]    Staff Present  Benay Pike, Exercise Physiologist;Debra Wynetta Emery, RN, BSN    Virtual Visit  No    Medication changes reported      No    Fall or balance concerns reported     No    Tobacco Cessation  No Change    Warm-up and Cool-down  Performed as group-led instruction    Resistance Training Performed  Yes    VAD Patient?  No    PAD/SET Patient?  No      Pain Assessment   Currently in Pain?  No/denies    Pain Score  0-No pain    Multiple Pain Sites  No       Capillary Blood Glucose: No results found for this or any previous visit (from the past 24 hour(s)).    Social History   Tobacco Use  Smoking Status Former Smoker  Smokeless Tobacco Never Used  Tobacco Comment   quit in 2017. Smoked for 30 years prior to that.     Goals Met:  Independence with exercise equipment Exercise tolerated well No report of cardiac concerns or symptoms Strength training completed today  Goals Unmet:  Not Applicable  Comments: Pt able to follow exercise prescription today without complaint.  Will continue to monitor for progression. Check out 1200.   Dr. Kate Sable is Medical Director for [facility] Cardiac and Pulmonary Rehab.

## 2019-09-04 ENCOUNTER — Encounter (HOSPITAL_COMMUNITY)
Admission: RE | Admit: 2019-09-04 | Discharge: 2019-09-04 | Disposition: A | Discharge status: Home / Self Care | Payer: Medicare Other | Source: Ambulatory Visit | Attending: Cardiology | Admitting: Cardiology

## 2019-09-04 ENCOUNTER — Other Ambulatory Visit: Payer: Self-pay

## 2019-09-04 DIAGNOSIS — Z955 Presence of coronary angioplasty implant and graft: Secondary | ICD-10-CM

## 2019-09-04 DIAGNOSIS — I2102 ST elevation (STEMI) myocardial infarction involving left anterior descending coronary artery: Secondary | ICD-10-CM

## 2019-09-04 NOTE — Progress Notes (Signed)
Daily Session Note  Patient Details  Name: Rachel Kennedy MRN: [id number] Date of Birth: July 26, 1946 Referring Provider:     [place] from 06/27/2019 in [place]  Referring Provider  [place]       Encounter Date: 09/04/2019  Check In: Session Check In - 09/04/19 1100      Check-In   Supervising physician immediately available to respond to emergencies  See telemetry face sheet for immediately available MD    Location  [facility]    Staff Present  Russella Dar, MS, EP, [facility], Exercise Physiologist;Doloros Kwolek, Exercise Physiologist;Debra Wynetta Emery, RN, BSN    Virtual Visit  No    Medication changes reported      No    Fall or balance concerns reported     No    Tobacco Cessation  No Change    Warm-up and Cool-down  Performed as group-led instruction    Resistance Training Performed  Yes    VAD Patient?  No    PAD/SET Patient?  No      Pain Assessment   Currently in Pain?  No/denies    Pain Score  0-No pain    Multiple Pain Sites  No       Capillary Blood Glucose: No results found for this or any previous visit (from the past 24 hour(s)).    Social History   Tobacco Use  Smoking Status Former Smoker  Smokeless Tobacco Never Used  Tobacco Comment   quit in 2017. Smoked for 30 years prior to that.     Goals Met:  Independence with exercise equipment Exercise tolerated well No report of cardiac concerns or symptoms Strength training completed today  Goals Unmet:  Not Applicable  Comments: Pt able to follow exercise prescription today without complaint.  Will continue to monitor for progression. Check out 1200.   Dr. Kate Sable is Medical Director for [facility] Cardiac and Pulmonary Rehab.

## 2019-09-06 ENCOUNTER — Other Ambulatory Visit: Payer: Self-pay

## 2019-09-06 ENCOUNTER — Encounter (HOSPITAL_COMMUNITY)
Admission: RE | Admit: 2019-09-06 | Discharge: 2019-09-06 | Disposition: A | Discharge status: Home / Self Care | Payer: Medicare Other | Source: Ambulatory Visit | Attending: Cardiology | Admitting: Cardiology

## 2019-09-06 DIAGNOSIS — I2102 ST elevation (STEMI) myocardial infarction involving left anterior descending coronary artery: Secondary | ICD-10-CM | POA: Diagnosis not present

## 2019-09-06 DIAGNOSIS — Z955 Presence of coronary angioplasty implant and graft: Secondary | ICD-10-CM

## 2019-09-06 NOTE — Progress Notes (Signed)
Daily Session Note  Patient Details  Name: SIGNE TACKITT MRN: [id number] Date of Birth: 10-01-1946 Referring Provider:     [place] from 06/27/2019 in [place]  Referring Provider  [place]       Encounter Date: 09/06/2019  Check In: Session Check In - 09/06/19 1100      Check-In   Supervising physician immediately available to respond to emergencies  See telemetry face sheet for immediately available MD    Location  [facility]    Staff Present  Benay Pike, Exercise Physiologist;Debra Wynetta Emery, RN, BSN    Virtual Visit  No    Medication changes reported      No    Fall or balance concerns reported     No    Tobacco Cessation  No Change    Warm-up and Cool-down  Performed as group-led instruction    Resistance Training Performed  Yes    VAD Patient?  No    PAD/SET Patient?  No      Pain Assessment   Currently in Pain?  No/denies    Pain Score  0-No pain    Multiple Pain Sites  No       Capillary Blood Glucose: No results found for this or any previous visit (from the past 24 hour(s)).    Social History   Tobacco Use  Smoking Status Former Smoker  Smokeless Tobacco Never Used  Tobacco Comment   quit in 2017. Smoked for 30 years prior to that.     Goals Met:  Independence with exercise equipment Exercise tolerated well No report of cardiac concerns or symptoms Strength training completed today  Goals Unmet:  Not Applicable  Comments: Pt able to follow exercise prescription today without complaint.  Will continue to monitor for progression. Check out 1200.   Dr. Kate Sable is Medical Director for [facility] Cardiac and Pulmonary Rehab.

## 2019-09-08 ENCOUNTER — Encounter (HOSPITAL_COMMUNITY)
Admission: RE | Admit: 2019-09-08 | Discharge: 2019-09-08 | Disposition: A | Discharge status: Home / Self Care | Payer: Medicare Other | Source: Ambulatory Visit | Attending: Cardiology | Admitting: Cardiology

## 2019-09-08 ENCOUNTER — Other Ambulatory Visit: Payer: Self-pay

## 2019-09-08 DIAGNOSIS — I2102 ST elevation (STEMI) myocardial infarction involving left anterior descending coronary artery: Secondary | ICD-10-CM

## 2019-09-08 DIAGNOSIS — Z955 Presence of coronary angioplasty implant and graft: Secondary | ICD-10-CM

## 2019-09-08 NOTE — Progress Notes (Signed)
Daily Session Note  Patient Details  Name: BELANNA MANRING MRN: [id number] Date of Birth: [birth year] Referring Provider:     [place] from 06/27/2019 in [place]  Referring Provider  [place]       Encounter Date: 09/08/2019  Check In: Session Check In - 09/08/19 1100      Check-In   Supervising physician immediately available to respond to emergencies  See telemetry face sheet for immediately available MD    Location  [facility]    Staff Present  Russella Dar, MS, EP, [facility], Exercise Physiologist;Charlie Char, Exercise Physiologist;Debra Wynetta Emery, RN, BSN    Virtual Visit  No    Medication changes reported      No    Fall or balance concerns reported     No    Tobacco Cessation  No Change    Warm-up and Cool-down  Performed as group-led instruction    Resistance Training Performed  Yes    VAD Patient?  No    PAD/SET Patient?  No      Pain Assessment   Currently in Pain?  No/denies    Pain Score  0-No pain    Multiple Pain Sites  No       Capillary Blood Glucose: No results found for this or any previous visit (from the past 24 hour(s)).    Social History   Tobacco Use  Smoking Status Former Smoker  Smokeless Tobacco Never Used  Tobacco Comment   quit in 2017. Smoked for 30 years prior to that.     Goals Met:  Independence with exercise equipment Exercise tolerated well No report of cardiac concerns or symptoms Strength training completed today  Goals Unmet:  Not Applicable  Comments: Pt able to follow exercise prescription today without complaint.  Will continue to monitor for progression. Check out 1200.   Dr. Kate Sable is Medical Director for [facility] Cardiac and Pulmonary Rehab.

## 2019-09-11 ENCOUNTER — Other Ambulatory Visit: Payer: Self-pay

## 2019-09-11 ENCOUNTER — Encounter (HOSPITAL_COMMUNITY)
Admission: RE | Admit: 2019-09-11 | Discharge: 2019-09-11 | Disposition: A | Discharge status: Home / Self Care | Payer: Medicare Other | Source: Ambulatory Visit | Attending: Cardiology | Admitting: Cardiology

## 2019-09-11 DIAGNOSIS — I2102 ST elevation (STEMI) myocardial infarction involving left anterior descending coronary artery: Secondary | ICD-10-CM

## 2019-09-11 DIAGNOSIS — Z955 Presence of coronary angioplasty implant and graft: Secondary | ICD-10-CM

## 2019-09-11 NOTE — Progress Notes (Signed)
Daily Session Note  Patient Details  Name: Rachel Kennedy MRN: [id number] Date of Birth: [birth year] Referring Provider:     [place] from 06/27/2019 in [place]  Referring Provider  [place]       Encounter Date: 09/11/2019  Check In: Session Check In - 09/11/19 1100      Check-In   Supervising physician immediately available to respond to emergencies  See telemetry face sheet for immediately available MD    Location  [facility]    Staff Present  Russella Dar, MS, EP, [facility], Exercise Physiologist;Semisi Biela, Exercise Physiologist;Debra Wynetta Emery, RN, BSN    Virtual Visit  No    Medication changes reported      No    Fall or balance concerns reported     No    Tobacco Cessation  No Change    Warm-up and Cool-down  Performed as group-led instruction    Resistance Training Performed  Yes    VAD Patient?  No    PAD/SET Patient?  No      Pain Assessment   Currently in Pain?  No/denies    Pain Score  0-No pain    Multiple Pain Sites  No       Capillary Blood Glucose: No results found for this or any previous visit (from the past 24 hour(s)).    Social History   Tobacco Use  Smoking Status Former Smoker  Smokeless Tobacco Never Used  Tobacco Comment   quit in 2017. Smoked for 30 years prior to that.     Goals Met:  Independence with exercise equipment Exercise tolerated well No report of cardiac concerns or symptoms Strength training completed today  Goals Unmet:  Not Applicable  Comments: Pt able to follow exercise prescription today without complaint.  Will continue to monitor for progression. Check out 1200.   Dr. Kate Sable is Medical Director for [facility] Cardiac and Pulmonary Rehab.

## 2019-09-12 NOTE — Progress Notes (Signed)
Cardiac Individual Treatment Plan  Patient Details  Name: Rachel Kennedy MRN: [id number] Date of Birth: 13-Dec-1946 Referring Provider:     [place] from 06/27/2019 in [place]  Referring Provider  [place]       Initial Encounter Date:    CARDIAC REHAB PHASE II ORIENTATION from 06/27/2019 in [place]  Date  06/27/19      Visit Diagnosis: ST elevation myocardial infarction involving left anterior descending (LAD) coronary artery [facility])  Status post coronary artery stent placement  Patient's Home Medications on Admission:  Current Outpatient Medications:  .  acetaminophen (TYLENOL) 500 MG tablet, Take 500 mg by mouth every 6 (six) hours as needed for headache (pain)., Disp: , Rfl:  .  aspirin EC 81 MG tablet, Take 1 tablet (81 mg total) by mouth daily., Disp: , Rfl:  .  atorvastatin (LIPITOR) 80 MG tablet, TAKE 1 TABLET (80 MG TOTAL) BY MOUTH DAILY AT 6 PM., Disp: 90 tablet, Rfl: 0 .  buPROPion (WELLBUTRIN XL) 300 MG 24 hr tablet, Take 300 mg by mouth daily., Disp: , Rfl: 1 .  carvedilol (COREG) 6.25 MG tablet, TAKE 1 TABLET (6.25 MG TOTAL) BY MOUTH 2 (TWO) TIMES DAILY WITH A MEAL., Disp: 180 tablet, Rfl: 2 .  cycloSPORINE (RESTASIS) 0.05 % ophthalmic emulsion, Place 1 drop into both eyes 2 (two) times daily., Disp: , Rfl:  .  doxycycline (PERIOSTAT) 20 MG tablet, Take 20 mg by mouth 2 (two) times daily., Disp: , Rfl: 2 .  losartan (COZAAR) 25 MG tablet, TAKE 1 TABLET BY MOUTH EVERY DAY, Disp: 90 tablet, Rfl: 0 .  nitroGLYCERIN (NITROSTAT) 0.4 MG SL tablet, Place 1 tablet (0.4 mg total) under the tongue every 5 (five) minutes x 3 doses as needed for chest pain., Disp: 25 tablet, Rfl: 1 .  PRESCRIPTION MEDICATION, Apply 1 application topically daily as needed (arthritis pain). Arthritis cream compounded at [facility]   Diclofenac 3% Baclofen 2% gabapentin 5% Lidocaine 5% Menthol 1%, Disp: , Rfl:  .   sertraline (ZOLOFT) 100 MG tablet, Take 100 mg by mouth at bedtime. , Disp: , Rfl:  .  ticagrelor (BRILINTA) 90 MG TABS tablet, Take 1 tablet (90 mg total) by mouth 2 (two) times daily., Disp: 180 tablet, Rfl: 2 .  zolpidem (AMBIEN) 10 MG tablet, Take 10 mg by mouth at bedtime as needed for sleep. , Disp: , Rfl:   Past Medical History: Past Medical History:  Diagnosis Date  . Acute systolic (congestive) heart failure ([place])   . CAD (coronary artery disease) 05/07/2019  . Depression 05/07/2019  . Hyperlipidemia   . Hypertension   . STEMI (ST elevation myocardial infarction) ([place])     Tobacco Use: Social History   Tobacco Use  Smoking Status Former Smoker  Smokeless Tobacco Never Used  Tobacco Comment   quit in 2017. Smoked for 30 years prior to that.     Labs: Recent Review Flowsheet Data    Labs for ITP Cardiac and Pulmonary Rehab Latest Ref Rng & Units 02/09/2019 02/10/2019 04/10/2019   Cholestrol 0 - 200 mg/dL - 194 133   LDLCALC 0 - 99 mg/dL - 126(H) 61   HDL >40 mg/dL - 37(L) 52   Trlycerides <150 mg/dL - 156(H) 100   Hemoglobin A1c 4.8 - 5.6 % 5.8(H) - -      Capillary Blood Glucose: No results found for: GLUCAP   Exercise Target Goals: Exercise Program Goal: Individual exercise  prescription set using results from initial 6 min walk test and THRR while considering  patient's activity barriers and safety.   Exercise Prescription Goal: Starting with aerobic activity 30 plus minutes a day, 3 days per week for initial exercise prescription. Provide home exercise prescription and guidelines that participant acknowledges understanding prior to discharge.  Activity Barriers & Risk Stratification: Activity Barriers & Cardiac Risk Stratification - 06/27/19 1406      Activity Barriers & Cardiac Risk Stratification   Cardiac Risk Stratification  High       6 Minute Walk: 6 Minute Walk    Row Name 06/27/19 1405         6 Minute Walk   Phase  Initial     Distance   1400 feet     Walk Time  6 minutes     # of Rest Breaks  0     MPH  2.6     METS  3     RPE  10     Perceived Dyspnea   10     VO2 Peak  10.01     Symptoms  No     Resting HR  58 bpm     Resting BP  150/72     Resting Oxygen Saturation   94 %     Exercise Oxygen Saturation  during 6 min walk  92 %     Max Ex. HR  83 bpm     Max Ex. BP  156/78     2 Minute Post BP  146/70        Oxygen Initial Assessment:   Oxygen Re-Evaluation:   Oxygen Discharge (Final Oxygen Re-Evaluation):   Initial Exercise Prescription: Initial Exercise Prescription - 06/27/19 1400      Date of Initial Exercise RX and Referring Provider   Date  06/27/19    Referring Provider  [place]     Expected Discharge Date  09/27/19      Treadmill   MPH  2    Grade  0    Minutes  17    METs  2.9      NuStep   Level  1    SPM  50    Minutes  17    METs  2      Prescription Details   Frequency (times per week)  3    Duration  Progress to 30 minutes of continuous aerobic without signs/symptoms of physical distress      Intensity   THRR 40-80% of Max Heartrate  36-54-72    Ratings of Perceived Exertion  11-13    Perceived Dyspnea  0-4      Progression   Progression  Continue to progress workloads to maintain intensity without signs/symptoms of physical distress.      Resistance Training   Training Prescription  Yes    Weight  1    Reps  10-15       Perform Capillary Blood Glucose checks as needed.  Exercise Prescription Changes:  Exercise Prescription Changes    Row Name 07/20/19 1300 08/01/19 0900 08/22/19 0800 09/11/19 1400       Response to Exercise   Blood Pressure (Admit)  124/64  134/70  140/80  130/70    Blood Pressure (Exercise)  138/62  148/82  152/76  150/64    Blood Pressure (Exit)  120/64  122/70  128/80  130/74    Heart Rate (Admit)  60 bpm  59 bpm  61  bpm  63 bpm    Heart Rate (Exercise)  82 bpm  78 bpm  87 bpm  82 bpm    Heart Rate (Exit)  69 bpm  64 bpm  69 bpm  72  bpm    Rating of Perceived Exertion (Exercise)  '11  11  11  11    ' Comments  first two weeks of exercise   -  increase in overall MET level   -    Duration  Continue with 30 min of aerobic exercise without signs/symptoms of physical distress.  Continue with 30 min of aerobic exercise without signs/symptoms of physical distress.  Continue with 30 min of aerobic exercise without signs/symptoms of physical distress.  Continue with 30 min of aerobic exercise without signs/symptoms of physical distress.    Intensity  THRR New [phone]  THRR unchanged  THRR unchanged  THRR unchanged      Progression   Progression  Continue to progress workloads to maintain intensity without signs/symptoms of physical distress.  Continue to progress workloads to maintain intensity without signs/symptoms of physical distress.  Continue to progress workloads to maintain intensity without signs/symptoms of physical distress.  Continue to progress workloads to maintain intensity without signs/symptoms of physical distress.    Average METs  2.7  2.55  3.5  2.3      Resistance Training   Training Prescription  Yes  Yes  Yes  Yes    Weight  '2  2  3  3    ' Reps  10-15  10-15  10-15  10-15      Treadmill   MPH  - couldnt get gait correct   -  -  1.8    Grade  -  -  -  0    Minutes  -  -  -  17    METs  -  -  -  2.37      NuStep   Level  '2  2  3  3    ' SPM  95  97  108  104    Minutes  '17  17  17  22    ' METs  2.1  2  2.6  2.2      Recumbant Elliptical   Level  '2  2  3  ' - increased strength enough to switch to treadmill    RPM  50  49  56  -    Watts  54  72  64  -    Minutes  '22  22  22  ' -    METs  3.3  3.1  4.4  -       Exercise Comments:  Exercise Comments    Row Name 07/10/19 1320 08/01/19 0946 08/22/19 0836 09/11/19 1414     Exercise Comments  Pt. is still new to the program, she has attended 7 sessions. She is working on the [place]. She has tolerated the exercise well and already  started to increase her workloads. We will progress her as needed.  Pt. has now attended 16 exercise sessions. Each session she comes willing to work hard and increase her personal output on the equipment. She has tolerated the exercise well.  Pt. has attended 25 sessions. She is here regularly and says this has been very good for her and is helping her to increase her strength.  Pt. has attended 33 sessions. She is alomst finished with the program but will be joining maintenance  after graduation. She has now started walking on the TM after increasing her strength on the Recumbant Elliptical.       Exercise Goals and Review:  Exercise Goals    Row Name 06/27/19 1407             Exercise Goals   Increase Physical Activity  Yes       Intervention  Develop an individualized exercise prescription for aerobic and resistive training based on initial evaluation findings, risk stratification, comorbidities and participant's personal goals.       Expected Outcomes  Long Term: Add in home exercise to make exercise part of routine and to increase amount of physical activity.;Short Term: Attend rehab on a regular basis to increase amount of physical activity.;Long Term: Exercising regularly at least 3-5 days a week.       Increase Strength and Stamina  Yes       Intervention  Develop an individualized exercise prescription for aerobic and resistive training based on initial evaluation findings, risk stratification, comorbidities and participant's personal goals.       Expected Outcomes  Short Term: Increase workloads from initial exercise prescription for resistance, speed, and METs.;Short Term: Perform resistance training exercises routinely during rehab and add in resistance training at home;Long Term: Improve cardiorespiratory fitness, muscular endurance and strength as measured by increased METs and functional capacity (6MWT)       Able to understand and use rate of perceived exertion (RPE) scale  Yes        Intervention  Provide education and explanation on how to use RPE scale       Expected Outcomes  Short Term: Able to use RPE daily in rehab to express subjective intensity level;Long Term:  Able to use RPE to guide intensity level when exercising independently       Knowledge and understanding of Target Heart Rate Range (THRR)  Yes       Intervention  Provide education and explanation of THRR including how the numbers were predicted and where they are located for reference       Expected Outcomes  Short Term: Able to state/look up THRR;Long Term: Able to use THRR to govern intensity when exercising independently;Short Term: Able to use daily as guideline for intensity in rehab       Able to check pulse independently  Yes       Intervention  Provide education and demonstration on how to check pulse in carotid and radial arteries.;Review the importance of being able to check your own pulse for safety during independent exercise       Expected Outcomes  Short Term: Able to explain why pulse checking is important during independent exercise;Long Term: Able to check pulse independently and accurately       Understanding of Exercise Prescription  Yes       Intervention  Provide education, explanation, and written materials on patient's individual exercise prescription       Expected Outcomes  Short Term: Able to explain program exercise prescription;Long Term: Able to explain home exercise prescription to exercise independently          Exercise Goals Re-Evaluation : Exercise Goals Re-Evaluation    Row Name 07/10/19 1317 08/01/19 0945 08/22/19 0835 09/11/19 1413       Exercise Goal Re-Evaluation   Exercise Goals Review  Increase Physical Activity;Increase Strength and Stamina;Able to understand and use rate of perceived exertion (RPE) scale;Knowledge and understanding of Target Heart Rate Range (THRR);Able to check pulse  independently;Understanding of Exercise Prescription  Increase Physical  Activity;Increase Strength and Stamina;Able to understand and use rate of perceived exertion (RPE) scale;Knowledge and understanding of Target Heart Rate Range (THRR);Able to check pulse independently;Understanding of Exercise Prescription  Increase Physical Activity;Increase Strength and Stamina;Able to understand and use rate of perceived exertion (RPE) scale;Knowledge and understanding of Target Heart Rate Range (THRR);Able to check pulse independently;Understanding of Exercise Prescription  Increase Physical Activity;Increase Strength and Stamina;Able to understand and use rate of perceived exertion (RPE) scale;Knowledge and understanding of Target Heart Rate Range (THRR);Able to check pulse independently;Understanding of Exercise Prescription    Comments  Pt. is new to the program. She has attended regularly and says this has helped her to get back to routine activity.  Pt. has done excellent in the program so far. She attends consistantly in order to help her reach her goals. She works hard to go farther distances on both machines every class.  Pt. continues to attend CR regularly. She works hard to increase her distance on both pieces of equipment.  Pt. continues to attend rehab regularly. She is always happy to be here and works hard every session. Daysi has tolerated all increases and changes with ease.    Expected Outcomes  Short: increase overall activity levels. Long: increase overall heart health.  Short: increase overall activity levels. Long: increase overall heart health.  Short: increase overall activity levels. Long: increase overall heart health.  Short: increase overall activity levels. Long: continue to exercise and build stength .        Discharge Exercise Prescription (Final Exercise Prescription Changes): Exercise Prescription Changes - 09/11/19 1400      Response to Exercise   Blood Pressure (Admit)  130/70    Blood Pressure (Exercise)  150/64    Blood Pressure (Exit)  130/74     Heart Rate (Admit)  63 bpm    Heart Rate (Exercise)  82 bpm    Heart Rate (Exit)  72 bpm    Rating of Perceived Exertion (Exercise)  11    Duration  Continue with 30 min of aerobic exercise without signs/symptoms of physical distress.    Intensity  THRR unchanged      Progression   Progression  Continue to progress workloads to maintain intensity without signs/symptoms of physical distress.    Average METs  2.3      Resistance Training   Training Prescription  Yes    Weight  3    Reps  10-15      Treadmill   MPH  1.8    Grade  0    Minutes  17    METs  2.37      NuStep   Level  3    SPM  104    Minutes  22    METs  2.2      Recumbant Elliptical   Level  --   increased strength enough to switch to treadmill      Nutrition:  Target Goals: Understanding of nutrition guidelines, daily intake of sodium <1528m, cholesterol <2041m calories 30% from fat and 7% or less from saturated fats, daily to have 5 or more servings of fruits and vegetables.  Biometrics: Pre Biometrics - 06/27/19 1408      Pre Biometrics   Height  '5\' 6"'  (1.676 m)    Weight  83.7 kg    Waist Circumference  37 inches    Hip Circumference  48 inches    Waist to Hip Ratio  0.77 %    BMI (Calculated)  29.8    Triceps Skinfold  14 mm    % Body Fat  37.4 %    Grip Strength  3.4 kg    Single Leg Stand  4 seconds        Nutrition Therapy Plan and Nutrition Goals: Nutrition Therapy & Goals - 09/12/19 0923      Personal Nutrition Goals   Comments  The RD meeting has not resumed post COVID-19. We are working on resuming these classes safely. We are also working with a new dietician. In the interim, we are providing education through hand-outs and one-on-one teaching. She says she is now more mindful of healthy choices and is limiting snacking and is drinking more water. Will continue to monitor for progress.      Intervention Plan   Intervention  Nutrition handout(s) given to patient.        Nutrition Assessments: Nutrition Assessments - 06/27/19 1511      Rate Your Plate Scores   Pre Score  59       Nutrition Goals Re-Evaluation:   Nutrition Goals Discharge (Final Nutrition Goals Re-Evaluation):   Psychosocial: Target Goals: Acknowledge presence or absence of significant depression and/or stress, maximize coping skills, provide positive support system. Participant is able to verbalize types and ability to use techniques and skills needed for reducing stress and depression.  Initial Review & Psychosocial Screening: Initial Psych Review & Screening - 06/27/19 1455      Initial Review   Current issues with  None Identified      Family Dynamics   Good Support System?  Yes      Barriers   Psychosocial barriers to participate in program  There are no identifiable barriers or psychosocial needs.      Screening Interventions   Interventions  Encouraged to exercise;Provide feedback about the scores to participant    Expected Outcomes  Long Term goal: The participant improves quality of Life and PHQ9 Scores as seen by post scores and/or verbalization of changes;Short Term goal: Identification and review with participant of any Quality of Life or Depression concerns found by scoring the questionnaire.       Quality of Life Scores: Quality of Life - 06/27/19 1409      Quality of Life   Select  Quality of Life      Quality of Life Scores   Health/Function Pre  22.37 %    Socioeconomic Pre  21.83 %    Psych/Spiritual Pre  20.36 %    Family Pre  19.5 %    GLOBAL Pre  21.47 %      Scores of 19 and below usually indicate a poorer quality of life in these areas.  A difference of  2-3 points is a clinically meaningful difference.  A difference of 2-3 points in the total score of the Quality of Life Index has been associated with significant improvement in overall quality of life, self-image, physical symptoms, and general health in studies assessing change in quality  of life.  PHQ-9: Recent Review Flowsheet Data    Depression screen [facility] 2/9 06/27/2019   Decreased Interest 0   Down, Depressed, Hopeless 0   PHQ - 2 Score 0   Altered sleeping 1   Tired, decreased energy 1   Change in appetite 0   Feeling bad or failure about yourself  0   Trouble concentrating 0   Moving slowly or fidgety/restless 0   Suicidal thoughts  0   PHQ-9 Score 2   Difficult doing work/chores Not difficult at all     Interpretation of Total Score  Total Score Depression Severity:  1-4 = Minimal depression, 5-9 = Mild depression, 10-14 = Moderate depression, 15-19 = Moderately severe depression, 20-27 = Severe depression   Psychosocial Evaluation and Intervention: Psychosocial Evaluation - 06/27/19 1509      Psychosocial Evaluation & Interventions   Interventions  Encouraged to exercise with the program and follow exercise prescription;Stress management education;Relaxation education    Comments  Patient's initial PHQ-9 score was 3 and her QOL score was 21.47 with no psychosocial issues identified.    Expected Outcomes  Patient will have no psychosocial issued identified at discharge.    Continue Psychosocial Services   No Follow up required       Psychosocial Re-Evaluation: Psychosocial Re-Evaluation    [place] Name 07/10/19 1506 08/01/19 1445 08/22/19 1224 09/12/19 5427       Psychosocial Re-Evaluation   Current issues with  None Identified  None Identified  None Identified  -    Comments  Patient's initial QOL score was 21.47 and her PHQ-9 score was 2 with no psychosocial issues identified. Will continue to monitor for progress.  Patient's initial QOL score was 21.47 and her PHQ-9 score was 2 with no psychosocial issues identified. Will continue to monitor for progress.  Patient's initial QOL score was 21.47 and her PHQ-9 score was 2 with no psychosocial issues identified. Will continue to monitor for progress.  Patient's initial QOL score was 21.47 and her PHQ-9 score  was 2 with no psychosocial issues identified. Will continue to monitor for progress.    Expected Outcomes  Patient will have no psychosocial issues identified at discharge.  Patient will have no psychosocial issues identified at discharge.  Patient will have no psychosocial issues identified at discharge.  Patient will have no psychosocial issues identified at discharge.    Interventions  Stress management education;Encouraged to attend Cardiac Rehabilitation for the exercise;Relaxation education  Stress management education;Encouraged to attend Cardiac Rehabilitation for the exercise;Relaxation education  Stress management education;Encouraged to attend Cardiac Rehabilitation for the exercise;Relaxation education  Stress management education;Encouraged to attend Cardiac Rehabilitation for the exercise;Relaxation education    Continue Psychosocial Services   No Follow up required  No Follow up required  No Follow up required  No Follow up required       Psychosocial Discharge (Final Psychosocial Re-Evaluation): Psychosocial Re-Evaluation - 09/12/19 0927      Psychosocial Re-Evaluation   Comments  Patient's initial QOL score was 21.47 and her PHQ-9 score was 2 with no psychosocial issues identified. Will continue to monitor for progress.    Expected Outcomes  Patient will have no psychosocial issues identified at discharge.    Interventions  Stress management education;Encouraged to attend Cardiac Rehabilitation for the exercise;Relaxation education    Continue Psychosocial Services   No Follow up required       Vocational Rehabilitation: Provide vocational rehab assistance to qualifying candidates.   Vocational Rehab Evaluation & Intervention: Vocational Rehab - 06/27/19 1514      Initial Vocational Rehab Evaluation & Intervention   Assessment shows need for Vocational Rehabilitation  No      Vocational Rehab Re-Evaulation   Comments  Patient is a retired Education officer, museum.        Education: Education Goals: Education classes will be provided on a weekly basis, covering required topics. Participant will state understanding/return demonstration of topics presented.  Learning Barriers/Preferences:  Learning Barriers/Preferences - 06/27/19 1513      Learning Barriers/Preferences   Learning Barriers  None    Learning Preferences  Written Material;Skilled Demonstration       Education Topics: Hypertension, Hypertension Reduction -Define heart disease and high blood pressure. Discus how high blood pressure affects the body and ways to reduce high blood pressure.   Exercise and Your Heart -Discuss why it is important to exercise, the FITT principles of exercise, normal and abnormal responses to exercise, and how to exercise safely.   CARDIAC REHAB PHASE II EXERCISE from 09/06/2019 in [place]  Date  06/28/19  Educator  D. Coad  Instruction Review Code  2- Demonstrated Understanding      Angina -Discuss definition of angina, causes of angina, treatment of angina, and how to decrease risk of having angina.   CARDIAC REHAB PHASE II EXERCISE from 09/06/2019 in [place]  Date  07/05/19  Educator  Etheleen Mayhew  Instruction Review Code  2- Demonstrated Understanding      Cardiac Medications -Review what the following cardiac medications are used for, how they affect the body, and side effects that may occur when taking the medications.  Medications include Aspirin, Beta blockers, calcium channel blockers, ACE Inhibitors, angiotensin receptor blockers, diuretics, digoxin, and antihyperlipidemics.   CARDIAC REHAB PHASE II EXERCISE from 09/06/2019 in [place]  Date  07/12/19  Educator  Wynetta Emery  Instruction Review Code  2- Demonstrated Understanding      Congestive Heart Failure -Discuss the definition of CHF, how to live with CHF, the signs and symptoms of CHF, and how keep track of weight  and sodium intake.   CARDIAC REHAB PHASE II EXERCISE from 09/06/2019 in [place]  Date  07/19/19  Educator  DC  Instruction Review Code  2- Demonstrated Understanding      Heart Disease and Intimacy -Discus the effect sexual activity has on the heart, how changes occur during intimacy as we age, and safety during sexual activity.   CARDIAC REHAB PHASE II EXERCISE from 09/06/2019 in [place]  Date  08/02/19  Educator  DC   Instruction Review Code  2- Demonstrated Understanding      Smoking Cessation / COPD -Discuss different methods to quit smoking, the health benefits of quitting smoking, and the definition of COPD.   CARDIAC REHAB PHASE II EXERCISE from 09/06/2019 in [place]  Date  08/09/19  Educator  DJ  Instruction Review Code  2- Demonstrated Understanding      Nutrition I: Fats -Discuss the types of cholesterol, what cholesterol does to the heart, and how cholesterol levels can be controlled.   CARDIAC REHAB PHASE II EXERCISE from 09/06/2019 in [place]  Date  08/16/19  Educator  DC  Instruction Review Code  2- Demonstrated Understanding      Nutrition II: Labels -Discuss the different components of food labels and how to read food label   CARDIAC REHAB PHASE II EXERCISE from 09/06/2019 in [place]  Date  08/23/19  Educator  DJ  Instruction Review Code  2- Demonstrated Understanding      Heart Parts/Heart Disease and PAD -Discuss the anatomy of the heart, the pathway of blood circulation through the heart, and these are affected by heart disease.   CARDIAC REHAB PHASE II EXERCISE from 09/06/2019 in [place]  Date  08/30/19  Educator  DJ   Instruction Review  Code  2- Demonstrated Understanding      Stress I: Signs and Symptoms -Discuss the causes of stress, how stress may lead to anxiety and depression, and ways to  limit stress.   CARDIAC REHAB PHASE II EXERCISE from 09/06/2019 in [place]  Date  09/06/19  Educator  DJ  Instruction Review Code  2- Demonstrated Understanding      Stress II: Relaxation -Discuss different types of relaxation techniques to limit stress.   Warning Signs of Stroke / TIA -Discuss definition of a stroke, what the signs and symptoms are of a stroke, and how to identify when someone is having stroke.   Knowledge Questionnaire Score: Knowledge Questionnaire Score - 06/27/19 1514      Knowledge Questionnaire Score   Pre Score  20/24       Core Components/Risk Factors/Patient Goals at Admission: Personal Goals and Risk Factors at Admission - 06/27/19 1514      Core Components/Risk Factors/Patient Goals on Admission    Weight Management  Obesity    Improve shortness of breath with ADL's  Yes    Intervention  Provide education, individualized exercise plan and daily activity instruction to help decrease symptoms of SOB with activities of daily living.    Expected Outcomes  Short Term: Improve cardiorespiratory fitness to achieve a reduction of symptoms when performing ADLs    Personal Goal Other  Yes    Personal Goal  Get in better shape and get heart healthy. Learn about heart health and heart disease.    Intervention  Attend CR 3 days/week and supplement with exercise at home 2 days/week.    Expected Outcomes  Patient will meet her personal goals.       Core Components/Risk Factors/Patient Goals Review:  Goals and Risk Factor Review    Row Name 07/10/19 1504 08/01/19 1446 08/22/19 1222 09/12/19 0924       Core Components/Risk Factors/Patient Goals Review   Personal Goals Review  Weight Management/Obesity;Improve shortness of breath with ADL's;Other Get in shape and get heart healthy.  Weight Management/Obesity;Improve shortness of breath with ADL's;Other Get in shape and get heart healty.  Weight Management/Obesity;Improve shortness of  breath with ADL's;Other Get in shape and get heart healthy.  Weight Management/Obesity;Improve shortness of breath with ADL's;Other Get in shape and get heart healthy.    Review  Patient has completed 7 sessions maintaining her weight since she started the program. She says her muscles feels sore the next day which she feels is a sign she is working hard and the program is benefiting her. She hopes that as she continues she will get in shape and heart healthy. Will continue to monitor for progress.  Patient has completed 16 sessions losing 1 lb since her initial visit. She is doing well in the program with progression. She says she is feeling stronger and better overall. She feels like she is getting in shape. She says she enjoys coming to the program. Will continue to monitor for progress.  Patient has completed 25 sessions losing 1 lb since last 30 day review. She continues to do well in the program with progression. She says the program is helping her have more purpose and she feels stronger and healthier. She feels like the program motivates her to eat healthier and to have a regular exercise routine. She feels the program is helping her work toward meeting her personal goals.  Patient has completed 33 sessions maintaining her weight since last 30 day review. She continues  to do well in the program with progression. She has started walking on the treadmill and is doing well. She is pleased with this. She says the program has improved her self-esteem and she has a better attitude toward her heart event. She feels she has increased lung capacity. She also has more endurance and wants to continue an exercise routine and plans to join our [employer]. Will continue to monitor for progress.    Expected Outcomes  Patient will continue to attend sessions and complete the program meeting her personal goals.  Patient will continue to attend sessions and complete the program meeting her personal  goals.  Patient will continue to attend sessions and complete the program meeting her personal goals.  Patient will continue to attend sessions and complete the program meeting her personal goals.       Core Components/Risk Factors/Patient Goals at Discharge (Final Review):  Goals and Risk Factor Review - 09/12/19 0924      Core Components/Risk Factors/Patient Goals Review   Personal Goals Review  Weight Management/Obesity;Improve shortness of breath with ADL's;Other   Get in shape and get heart healthy.   Review  Patient has completed 33 sessions maintaining her weight since last 30 day review. She continues to do well in the program with progression. She has started walking on the treadmill and is doing well. She is pleased with this. She says the program has improved her self-esteem and she has a better attitude toward her heart event. She feels she has increased lung capacity. She also has more endurance and wants to continue an exercise routine and plans to join our [employer]. Will continue to monitor for progress.    Expected Outcomes  Patient will continue to attend sessions and complete the program meeting her personal goals.       ITP Comments:   Comments: ITP REVIEW Pt is making expected progress toward Cardiac Rehab goals after completing 33 sessions. Recommend continued exercise, life style modification, education, and increased stamina and strength.

## 2019-09-13 ENCOUNTER — Encounter (HOSPITAL_COMMUNITY)
Admission: RE | Admit: 2019-09-13 | Discharge: 2019-09-13 | Disposition: A | Discharge status: Home / Self Care | Payer: Medicare Other | Source: Ambulatory Visit | Attending: Cardiology | Admitting: Cardiology

## 2019-09-13 ENCOUNTER — Other Ambulatory Visit: Payer: Self-pay

## 2019-09-13 DIAGNOSIS — I2102 ST elevation (STEMI) myocardial infarction involving left anterior descending coronary artery: Secondary | ICD-10-CM

## 2019-09-13 DIAGNOSIS — Z955 Presence of coronary angioplasty implant and graft: Secondary | ICD-10-CM

## 2019-09-13 NOTE — Progress Notes (Signed)
Daily Session Note  Patient Details  Name: Rachel Kennedy MRN: [id number] Date of Birth: [birth year] Referring Provider:     [place] from 06/27/2019 in [place]  Referring Provider  [place]       Encounter Date: 09/13/2019  Check In: Session Check In - 09/13/19 1130      Check-In   Supervising physician immediately available to respond to emergencies  See telemetry face sheet for immediately available MD    Location  [facility]    Staff Present  Russella Dar, MS, EP, [facility], Exercise Physiologist;Debra Wynetta Emery, RN, Cory Munch, Exercise Physiologist    Virtual Visit  No    Medication changes reported      No    Fall or balance concerns reported     No    Tobacco Cessation  No Change    Warm-up and Cool-down  Performed as group-led instruction    Resistance Training Performed  Yes    VAD Patient?  No    PAD/SET Patient?  No      Pain Assessment   Currently in Pain?  No/denies    Pain Score  0-No pain    Multiple Pain Sites  No       Capillary Blood Glucose: No results found for this or any previous visit (from the past 24 hour(s)).    Social History   Tobacco Use  Smoking Status Former Smoker  Smokeless Tobacco Never Used  Tobacco Comment   quit in 2017. Smoked for 30 years prior to that.     Goals Met:  Independence with exercise equipment Exercise tolerated well Personal goals reviewed No report of cardiac concerns or symptoms Strength training completed today  Goals Unmet:  Not Applicable  Comments: Check out: 1200   Dr. Kate Sable is Medical Director for [facility] and Pulmonary Rehab.

## 2019-09-15 ENCOUNTER — Other Ambulatory Visit: Payer: Self-pay

## 2019-09-15 ENCOUNTER — Encounter (HOSPITAL_COMMUNITY)
Admission: RE | Admit: 2019-09-15 | Discharge: 2019-09-15 | Disposition: A | Discharge status: Home / Self Care | Payer: Medicare Other | Source: Ambulatory Visit | Attending: Cardiology | Admitting: Cardiology

## 2019-09-15 DIAGNOSIS — I2102 ST elevation (STEMI) myocardial infarction involving left anterior descending coronary artery: Secondary | ICD-10-CM | POA: Diagnosis not present

## 2019-09-15 DIAGNOSIS — Z955 Presence of coronary angioplasty implant and graft: Secondary | ICD-10-CM

## 2019-09-15 NOTE — Progress Notes (Signed)
Daily Session Note  Patient Details  Name: Rachel Kennedy MRN: [id number] Date of Birth: 04-10-1946 Referring Provider:     [place] from 06/27/2019 in [place]  Referring Provider  [place]       Encounter Date: 09/15/2019  Check In: Session Check In - 09/15/19 1100      Check-In   Supervising physician immediately available to respond to emergencies  See telemetry face sheet for immediately available MD    Location  [facility]    Staff Present  Russella Dar, MS, EP, [facility], Exercise Physiologist;Debra Wynetta Emery, RN, [facility] Munch, Exercise Physiologist    Virtual Visit  No    Medication changes reported      No    Fall or balance concerns reported     No    Tobacco Cessation  No Change    Warm-up and Cool-down  Performed as group-led instruction    Resistance Training Performed  Yes    VAD Patient?  No    PAD/SET Patient?  No      Pain Assessment   Currently in Pain?  No/denies    Pain Score  0-No pain    Multiple Pain Sites  No       Capillary Blood Glucose: No results found for this or any previous visit (from the past 24 hour(s)).    Social History   Tobacco Use  Smoking Status Former Smoker  Smokeless Tobacco Never Used  Tobacco Comment   quit in 2017. Smoked for 30 years prior to that.     Goals Met:  Independence with exercise equipment Exercise tolerated well No report of cardiac concerns or symptoms Strength training completed today  Goals Unmet:  Not Applicable  Comments: Pt able to follow exercise prescription today without complaint.  Will continue to monitor for progression. Check out 1200.   Dr. Kate Sable is Medical Director for [facility] Card[facility] and Pulmonary Rehab.

## 2019-09-18 ENCOUNTER — Other Ambulatory Visit: Payer: Self-pay

## 2019-09-18 ENCOUNTER — Encounter (HOSPITAL_COMMUNITY)
Admission: RE | Admit: 2019-09-18 | Discharge: 2019-09-18 | Disposition: A | Discharge status: Home / Self Care | Payer: Medicare Other | Source: Ambulatory Visit | Attending: Cardiology | Admitting: Cardiology

## 2019-09-18 VITALS — Ht 66.0 in | Wt 180.6 lb

## 2019-09-18 DIAGNOSIS — I2102 ST elevation (STEMI) myocardial infarction involving left anterior descending coronary artery: Secondary | ICD-10-CM

## 2019-09-18 DIAGNOSIS — Z955 Presence of coronary angioplasty implant and graft: Secondary | ICD-10-CM

## 2019-09-18 NOTE — Progress Notes (Signed)
Daily Session Note  Patient Details  Name: Rachel Kennedy MRN: [id number] Date of Birth: 16-Feb-1946 Referring Provider:     [place] from 06/27/2019 in [place]  Referring Provider  [place]       Encounter Date: 09/18/2019  Check In: Session Check In - 09/18/19 0930      Check-In   Supervising physician immediately available to respond to emergencies  See telemetry face sheet for immediately available MD    Location  [facility]    Staff Present  Russella Dar, MS, EP, [facility], Exercise Physiologist;Debra Wynetta Emery, RN, [facility] Munch, Exercise Physiologist    Virtual Visit  No    Medication changes reported      No    Fall or balance concerns reported     No    Tobacco Cessation  No Change    Warm-up and Cool-down  Performed as group-led instruction    Resistance Training Performed  Yes    VAD Patient?  No    PAD/SET Patient?  No      Pain Assessment   Currently in Pain?  No/denies    Pain Score  0-No pain    Multiple Pain Sites  No       Capillary Blood Glucose: No results found for this or any previous visit (from the past 24 hour(s)).    Social History   Tobacco Use  Smoking Status Former Smoker  Smokeless Tobacco Never Used  Tobacco Comment   quit in 2017. Smoked for 30 years prior to that.     Goals Met:  Independence with exercise equipment Exercise tolerated well No report of cardiac concerns or symptoms Strength training completed today  Goals Unmet:  Not Applicable  Comments: Pt able to follow exercise prescription today without complaint.  Will continue to monitor for progression. Check out 1200.   Dr. Kate Sable is Medical Director for [facility] Cardiac and Pulmonary Rehab.

## 2019-09-19 NOTE — Progress Notes (Signed)
Discharge Progress Report  Patient Details  Name: Rachel Kennedy MRN: [id number] Date of Birth: [birth year] Referring Provider:     [place] from 06/27/2019 in [place]  Referring Provider  [place]        Number of Visits: 23   Reason for Discharge:  Patient reached a stable level of exercise. Patient independent in their exercise. Patient has met program and personal goals.  Smoking History:  Social History   Tobacco Use  Smoking Status Former Smoker  Smokeless Tobacco Never Used  Tobacco Comment   quit in 2017. Smoked for 30 years prior to that.     Diagnosis:  ST elevation myocardial infarction involving left anterior descending (LAD) coronary artery (HCC)  Status post coronary artery stent placement  ADL [facility]:   Initial Exercise Prescription: Initial Exercise Prescription - 06/27/19 1400      Date of Initial Exercise RX and Referring Provider   Date  06/27/19    Referring Provider  [place]     Expected Discharge Date  09/27/19      Treadmill   MPH  2    Grade  0    Minutes  17    METs  2.9      NuStep   Level  1    SPM  50    Minutes  17    METs  2      Prescription Details   Frequency (times per week)  3    Duration  Progress to 30 minutes of continuous aerobic without signs/symptoms of physical distress      Intensity   THRR 40-80% of Max Heartrate  36-54-72    Ratings of Perceived Exertion  11-13    Perceived Dyspnea  0-4      Progression   Progression  Continue to progress workloads to maintain intensity without signs/symptoms of physical distress.      Resistance Training   Training Prescription  Yes    Weight  1    Reps  10-15       Discharge Exercise Prescription (Final Exercise Prescription Changes): Exercise Prescription Changes - 09/11/19 1400      Response to Exercise   Blood Pressure (Admit)  130/70    Blood Pressure (Exercise)  150/64    Blood Pressure (Exit)  130/74     Heart Rate (Admit)  63 bpm    Heart Rate (Exercise)  82 bpm    Heart Rate (Exit)  72 bpm    Rating of Perceived Exertion (Exercise)  11    Duration  Continue with 30 min of aerobic exercise without signs/symptoms of physical distress.    Intensity  THRR unchanged      Progression   Progression  Continue to progress workloads to maintain intensity without signs/symptoms of physical distress.    Average METs  2.3      Resistance Training   Training Prescription  Yes    Weight  3    Reps  10-15      Treadmill   MPH  1.8    Grade  0    Minutes  17    METs  2.37      NuStep   Level  3    SPM  104    Minutes  22    METs  2.2      Recumbant Elliptical   Level  --   increased strength enough to switch to treadmill  Functional Capacity: 6 Minute Walk    Row Name 06/27/19 1405 09/19/19 1006       6 Minute Walk   Phase  Initial  Discharge    Distance  1400 feet  1700 feet    Distance % Change  -  21.4 %    Distance Feet Change  -  300 ft    Walk Time  6 minutes  6 minutes    # of Rest Breaks  0  0    MPH  2.6  3.21    METS  3  3.47    RPE  10  10    Perceived Dyspnea   10  8    VO2 Peak  10.01  12.69    Symptoms  No  No    Resting HR  58 bpm  61 bpm    Resting BP  150/72  132/72    Resting Oxygen Saturation   94 %  96 %    Exercise Oxygen Saturation  during 6 min walk  92 %  90 %    Max Ex. HR  83 bpm  98 bpm    Max Ex. BP  156/78  158/82    2 Minute Post BP  146/70  128/70       Psychological, QOL, Others - Outcomes: PHQ 2/9: Depression screen [facility] 2/9 09/19/2019 06/27/2019  Decreased Interest 0 0  Down, Depressed, Hopeless 0 0  PHQ - 2 Score 0 0  Altered sleeping 1 1  Tired, decreased energy 0 1  Change in appetite 1 0  Feeling bad or failure about yourself  0 0  Trouble concentrating 0 0  Moving slowly or fidgety/restless 0 0  Suicidal thoughts 0 0  PHQ-9 Score 2 2  Difficult doing work/chores Not difficult at all Not difficult at all    Quality  of Life: Quality of Life - 09/19/19 1008      Quality of Life   Select  Quality of Life      Quality of Life Scores   Health/Function Pre  22.37 %    Health/Function Post  20.37 %    Health/Function % Change  -8.94 %    Socioeconomic Pre  21.83 %    Socioeconomic Post  22.83 %    Socioeconomic % Change   4.58 %    Psych/Spiritual Pre  20.36 %    Psych/Spiritual Post  22.5 %    Psych/Spiritual % Change  10.51 %    Family Pre  19.5 %    Family Post  19.5 %    Family % Change  0 %    GLOBAL Pre  21.47 %    GLOBAL Post  21.56 %    GLOBAL % Change  0.42 %       Personal Goals: Goals established at orientation with interventions provided to work toward goal. Personal Goals and Risk Factors at Admission - 06/27/19 1514      Core Components/Risk Factors/Patient Goals on Admission    Weight Management  Obesity    Improve shortness of breath with ADL's  Yes    Intervention  Provide education, individualized exercise plan and daily activity instruction to help decrease symptoms of SOB with activities of daily living.    Expected Outcomes  Short Term: Improve cardiorespiratory fitness to achieve a reduction of symptoms when performing ADLs    Personal Goal Other  Yes    Personal Goal  Get in better shape and  get heart healthy. Learn about heart health and heart disease.    Intervention  Attend CR 3 days/week and supplement with exercise at home 2 days/week.    Expected Outcomes  Patient will meet her personal goals.        Personal Goals Discharge: Goals and Risk Factor Review    Row Name 07/10/19 1504 08/01/19 1446 08/22/19 1222 09/12/19 0924 09/19/19 1243     Core Components/Risk Factors/Patient Goals Review   Personal Goals Review  Weight Management/Obesity;Improve shortness of breath with ADL's;Other Get in shape and get heart healthy.  Weight Management/Obesity;Improve shortness of breath with ADL's;Other Get in shape and get heart healty.  Weight Management/Obesity;Improve  shortness of breath with ADL's;Other Get in shape and get heart healthy.  Weight Management/Obesity;Improve shortness of breath with ADL's;Other Get in shape and get heart healthy.  Weight Management/Obesity;Improve shortness of breath with ADL's;Other Better overall fitness; learn about heart healty. Have a healthy heart.   Review  Patient has completed 7 sessions maintaining her weight since she started the program. She says her muscles feels sore the next day which she feels is a sign she is working hard and the program is benefiting her. She hopes that as she continues she will get in shape and heart healthy. Will continue to monitor for progress.  Patient has completed 16 sessions losing 1 lb since her initial visit. She is doing well in the program with progression. She says she is feeling stronger and better overall. She feels like she is getting in shape. She says she enjoys coming to the program. Will continue to monitor for progress.  Patient has completed 25 sessions losing 1 lb since last 30 day review. She continues to do well in the program with progression. She says the program is helping her have more purpose and she feels stronger and healthier. She feels like the program motivates her to eat healthier and to have a regular exercise routine. She feels the program is helping her work toward meeting her personal goals.  Patient has completed 33 sessions maintaining her weight since last 30 day review. She continues to do well in the program with progression. She has started walking on the treadmill and is doing well. She is pleased with this. She says the program has improved her self-esteem and she has a better attitude toward her heart event. She feels she has increased lung capacity. She also has more endurance and wants to continue an exercise routine and plans to join our [employer]. Will continue to monitor for progress.  Patient graduated with 36 sessions. She did very  well in the program. Her exit measurements improved in grip strength and balance. Her exit walk test increased by 21.4%. She lost 4 lbs overall and her waist and hip circumference decreased by 2 inches. She states the program has allowed her to begin and maintain an exercise program and she has a better understanding of her condition and dietary guidelines. She says she feels stronger and more involved in her recovery. She says she is exercising everyday and has a lot more energy. She feels the program and help her to get in better healthy and her heart is healthier. She plans to join our forever fit maintenance program to continue exercising.   Expected Outcomes  Patient will continue to attend sessions and complete the program meeting her personal goals.  Patient will continue to attend sessions and complete the program meeting her personal goals.  Patient will  continue to attend sessions and complete the program meeting her personal goals.  Patient will continue to attend sessions and complete the program meeting her personal goals.  Patient will continue exercising in our forever fit maintenance program and continue to meet her personal goals.      Exercise Goals and Review: Exercise Goals    Row Name 06/27/19 1407             Exercise Goals   Increase Physical Activity  Yes       Intervention  Develop an individualized exercise prescription for aerobic and resistive training based on initial evaluation findings, risk stratification, comorbidities and participant's personal goals.       Expected Outcomes  Long Term: Add in home exercise to make exercise part of routine and to increase amount of physical activity.;Short Term: Attend rehab on a regular basis to increase amount of physical activity.;Long Term: Exercising regularly at least 3-5 days a week.       Increase Strength and Stamina  Yes       Intervention  Develop an individualized exercise prescription for aerobic and resistive training  based on initial evaluation findings, risk stratification, comorbidities and participant's personal goals.       Expected Outcomes  Short Term: Increase workloads from initial exercise prescription for resistance, speed, and METs.;Short Term: Perform resistance training exercises routinely during rehab and add in resistance training at home;Long Term: Improve cardiorespiratory fitness, muscular endurance and strength as measured by increased METs and functional capacity (6MWT)       Able to understand and use rate of perceived exertion (RPE) scale  Yes       Intervention  Provide education and explanation on how to use RPE scale       Expected Outcomes  Short Term: Able to use RPE daily in rehab to express subjective intensity level;Long Term:  Able to use RPE to guide intensity level when exercising independently       Knowledge and understanding of Target Heart Rate Range (THRR)  Yes       Intervention  Provide education and explanation of THRR including how the numbers were predicted and where they are located for reference       Expected Outcomes  Short Term: Able to state/look up THRR;Long Term: Able to use THRR to govern intensity when exercising independently;Short Term: Able to use daily as guideline for intensity in rehab       Able to check pulse independently  Yes       Intervention  Provide education and demonstration on how to check pulse in carotid and radial arteries.;Review the importance of being able to check your own pulse for safety during independent exercise       Expected Outcomes  Short Term: Able to explain why pulse checking is important during independent exercise;Long Term: Able to check pulse independently and accurately       Understanding of Exercise Prescription  Yes       Intervention  Provide education, explanation, and written materials on patient's individual exercise prescription       Expected Outcomes  Short Term: Able to explain program exercise prescription;Long  Term: Able to explain home exercise prescription to exercise independently          Exercise Goals Re-Evaluation: Exercise Goals Re-Evaluation    Row Name 07/10/19 1317 08/01/19 0945 08/22/19 0835 09/11/19 1413       Exercise Goal Re-Evaluation   Exercise Goals Review  Increase Physical  Activity;Increase Strength and Stamina;Able to understand and use rate of perceived exertion (RPE) scale;Knowledge and understanding of Target Heart Rate Range (THRR);Able to check pulse independently;Understanding of Exercise Prescription  Increase Physical Activity;Increase Strength and Stamina;Able to understand and use rate of perceived exertion (RPE) scale;Knowledge and understanding of Target Heart Rate Range (THRR);Able to check pulse independently;Understanding of Exercise Prescription  Increase Physical Activity;Increase Strength and Stamina;Able to understand and use rate of perceived exertion (RPE) scale;Knowledge and understanding of Target Heart Rate Range (THRR);Able to check pulse independently;Understanding of Exercise Prescription  Increase Physical Activity;Increase Strength and Stamina;Able to understand and use rate of perceived exertion (RPE) scale;Knowledge and understanding of Target Heart Rate Range (THRR);Able to check pulse independently;Understanding of Exercise Prescription    Comments  Pt. is new to the program. She has attended regularly and says this has helped her to get back to routine activity.  Pt. has done excellent in the program so far. She attends consistantly in order to help her reach her goals. She works hard to go farther distances on both machines every class.  Pt. continues to attend CR regularly. She works hard to increase her distance on both pieces of equipment.  Pt. continues to attend rehab regularly. She is always happy to be here and works hard every session. Rachel Kennedy has tolerated all increases and changes with ease.    Expected Outcomes  Short: increase overall activity  levels. Long: increase overall heart health.  Short: increase overall activity levels. Long: increase overall heart health.  Short: increase overall activity levels. Long: increase overall heart health.  Short: increase overall activity levels. Long: continue to exercise and build stength .       Nutrition & Weight - Outcomes: Pre Biometrics - 06/27/19 1408      Pre Biometrics   Height  '5\' 6"'  (1.676 m)    Weight  83.7 kg    Waist Circumference  37 inches    Hip Circumference  48 inches    Waist to Hip Ratio  0.77 %    BMI (Calculated)  29.8    Triceps Skinfold  14 mm    % Body Fat  37.4 %    Grip Strength  3.4 kg    Single Leg Stand  4 seconds      Post Biometrics - 09/19/19 1008       Post  Biometrics   Height  '5\' 6"'  (1.676 m)    Weight  81.9 kg    Waist Circumference  36 inches    Hip Circumference  45.5 inches    Waist to Hip Ratio  0.79 %    BMI (Calculated)  29.16    Triceps Skinfold  13 mm    % Body Fat  35.9 %    Grip Strength  4.8 kg    Single Leg Stand  8.98 seconds       Nutrition: Nutrition Therapy & Goals - 09/12/19 0923      Personal Nutrition Goals   Comments  The RD meeting has not resumed post COVID-19. We are working on resuming these classes safely. We are also working with a new dietician. In the interim, we are providing education through hand-outs and one-on-one teaching. She says she is now more mindful of healthy choices and is limiting snacking and is drinking more water. Will continue to monitor for progress.      Intervention Plan   Intervention  Nutrition handout(s) given to patient.  Nutrition Discharge: Nutrition Assessments - 09/19/19 1240      Rate Your Plate Scores   Pre Score  59    Post Score  10       Education Questionnaire Score: Knowledge Questionnaire Score - 09/19/19 1240      Knowledge Questionnaire Score   Pre Score  20/24    Post Score  20/24      Patient graduated from [facility] today on  09/18/19 after completing 36 sessions. She achieved LTG of 30 minutes of aerobic exercise at Max Met level of 3.5. All patients vitals are WNL. Discharge instruction has been reviewed in detail and patient stated an understanding of material given. Patient plans to join our [employer] program to continue exercising. Cardiac Rehab staff will make f/u calls at 1 month, 6 months, and 1 year. Patient had no complaints of any abnormal S/S or pain on their exit visit.   Goals reviewed with patient; copy given to patient.

## 2019-09-28 ENCOUNTER — Other Ambulatory Visit: Payer: Self-pay | Admitting: Cardiology

## 2019-10-26 ENCOUNTER — Other Ambulatory Visit: Payer: Self-pay | Admitting: Cardiology

## 2019-10-26 DIAGNOSIS — I6523 Occlusion and stenosis of bilateral carotid arteries: Secondary | ICD-10-CM

## 2019-10-27 ENCOUNTER — Other Ambulatory Visit: Payer: Self-pay | Admitting: Cardiology

## 2019-11-07 ENCOUNTER — Ambulatory Visit (HOSPITAL_COMMUNITY)
Admission: RE | Admit: 2019-11-07 | Discharge: 2019-11-07 | Disposition: A | Discharge status: Home / Self Care | Payer: Medicare Other | Source: Ambulatory Visit | Attending: Cardiovascular Disease | Admitting: Cardiovascular Disease

## 2019-11-07 ENCOUNTER — Other Ambulatory Visit: Payer: Self-pay

## 2019-11-07 DIAGNOSIS — I6523 Occlusion and stenosis of bilateral carotid arteries: Secondary | ICD-10-CM | POA: Diagnosis present

## 2019-11-08 ENCOUNTER — Other Ambulatory Visit: Payer: Self-pay

## 2019-11-08 DIAGNOSIS — I6523 Occlusion and stenosis of bilateral carotid arteries: Secondary | ICD-10-CM

## 2019-11-08 NOTE — Progress Notes (Signed)
carotids 

## 2019-12-07 NOTE — Progress Notes (Signed)
Date:  12/08/2019   ID:  [name], Kennedy 1946-02-28, MRN [id number]   PCP:  Sharilyn Sites, MD  Cardiologist:  Gamaliel Charney Martinique, MD  Electrophysiologist:  None   Evaluation Performed:  Follow-Up Visit  Chief Complaint:  Follow up CAD  History of Present Illness:    Rachel Kennedy is a 73 y.o. female with PMH of HTN, HLD, and  CAD.  Patient was admitted to the hospital on 02/09/2019 with code STEMI.  Initial EKG was consistent with anterior STEMI.   Initial troponin was 0.24.  Patient underwent emergent cardiac catheterization on 02/09/2019 which revealed 50% proximal to mid RCA lesion, 50% ostial to proximal left circumflex lesion, 80% mid to distal LAD lesion, 100% ostial to proximal LAD occlusion treated with Onyx resolute 3.0 x 30 mm DES, EF 25 to 35%, trivial 1+ MR.  Her troponin peaked at 57.07 before trending back down.  Echocardiogram obtained on the following day showed EF 35 to 40%, moderate hypokinesis of the LV basal anteroseptal and basal inferoseptal region, mild sclerosis of the aortic valve.  Lipid panel obtained during the hospital showed LDL 126, HDL 37.  Patient was discharged on aspirin and Brilinta along with carvedilol and losartan.  Blood pressure in the hospital was too low for Entresto.  She was admitted overnight on 05/07/19 with an episode of near syncope. She was not orthostatic. Troponin negative. Carotid dopplers showed moderate bilateral disease with > 70% on the right. CTA revealed 60 % stenosis if left ICA and 50% in right ICA. Head CT was normal. Repeat carotid dopplers in November showed bilateral 60-79% disease. Patient thinks this may have been related to Ambien.  On follow up today she is doing well. Has been participating in Cardiac Rehab at [facility] and BP has been good. No chest pain. Breathing is better. No dizziness or palpitations. She does note some diarrhea ever since her MI.    Past Medical History:  Diagnosis Date  . Acute systolic  (congestive) heart failure ([place])   . CAD (coronary artery disease) 05/07/2019  . Depression 05/07/2019  . Hyperlipidemia   . Hypertension   . STEMI (ST elevation myocardial infarction) [facility])    Past Surgical History:  Procedure Laterality Date  . CORONARY/GRAFT ACUTE MI REVASCULARIZATION N/A 02/09/2019   Procedure: CORONARY/GRAFT ACUTE MI REVASCULARIZATION;  Surgeon: Martinique, Maxwell Martorano M, MD;  Location: [place] [facility];  Service: Cardiovascular;  Laterality: N/A;  . FRACTURE SURGERY    . LEFT HEART CATH AND CORONARY ANGIOGRAPHY N/A 02/09/2019   Procedure: LEFT HEART CATH AND CORONARY ANGIOGRAPHY;  Surgeon: Martinique, Antoni Stefan M, MD;  Location: [place] [facility];  Service: Cardiovascular;  Laterality: N/A;  . OVARY SURGERY       Current Meds  Medication Sig  . acetaminophen (TYLENOL) 500 MG tablet Take 500 mg by mouth every 6 (six) hours as needed for headache (pain).  Marland Kitchen aspirin EC 81 MG tablet Take 1 tablet (81 mg total) by mouth daily.  Marland Kitchen atorvastatin (LIPITOR) 80 MG tablet TAKE 1 TABLET (80 MG TOTAL) BY MOUTH DAILY AT 6 PM.  . BRILINTA 90 MG TABS tablet TAKE 1 TABLET (90 MG TOTAL) BY MOUTH 2 (TWO) TIMES DAILY.  Marland Kitchen buPROPion (WELLBUTRIN XL) 300 MG 24 hr tablet Take 300 mg by mouth daily.  . carvedilol (COREG) 6.25 MG tablet TAKE 1 TABLET (6.25 MG TOTAL) BY MOUTH 2 (TWO) TIMES DAILY WITH A MEAL.  . cycloSPORINE (RESTASIS) 0.05 % ophthalmic emulsion  Place 1 drop into both eyes 2 (two) times daily.  Marland Kitchen doxycycline (PERIOSTAT) 20 MG tablet Take 20 mg by mouth 2 (two) times daily.  Marland Kitchen losartan (COZAAR) 25 MG tablet TAKE 1 TABLET BY MOUTH EVERY DAY  . nitroGLYCERIN (NITROSTAT) 0.4 MG SL tablet Place 1 tablet (0.4 mg total) under the tongue every 5 (five) minutes x 3 doses as needed for chest pain.  Marland Kitchen PRESCRIPTION MEDICATION Apply 1 application topically daily as needed (arthritis pain). Arthritis cream compounded at [facility]   Diclofenac 3% Baclofen 2% gabapentin 5% Lidocaine 5% Menthol 1%   . sertraline (ZOLOFT) 100 MG tablet Take 100 mg by mouth at bedtime.   Marland Kitchen zolpidem (AMBIEN) 10 MG tablet Take 10 mg by mouth at bedtime as needed for sleep.      Allergies:   Patient has no known allergies.   Social History   Tobacco Use  . Smoking status: Former Games developer  . Smokeless tobacco: Never Used  . Tobacco comment: quit in 2017. Smoked for 30 years prior to that.   Substance Use Topics  . Alcohol use: No    Alcohol/week: 0.0 standard drinks  . Drug use: No     Family Hx: The patient's family history includes Heart disease in her father; Transient ischemic attack in her mother.  ROS:   Please see the history of present illness.    All other systems reviewed and are negative.   Prior CV studies:   The following studies were reviewed today:  Cath 02/09/2019  Prox RCA to Mid RCA lesion is 50% stenosed.  Ost Cx to Prox Cx lesion is 30% stenosed.  Mid LAD to Dist LAD lesion is 80% stenosed.  Ost LAD to Prox LAD lesion is 100% stenosed.  Post intervention, there is a 0% residual stenosis.  A drug-eluting stent was successfully placed using a STENT RESOLUTE ONYX 3.0X30.  There is severe left ventricular systolic dysfunction.  LV end diastolic pressure is normal.  The left ventricular ejection fraction is 25-35% by visual estimate.  There is trivial (1+) mitral regurgitation.  1. 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 Diffuse 50% proximal to mid RCA 3. Severe LV dysfunction with anterolateral akinesis. EF 30-35%. 4. Normal LVEDP 5. Successful PCI with stenting of the proximal LAD with DES. There is persistent diffuse narrowing of the mid to distal LAD of unclear etiology ? Spasm  Plan: DAPT for one year. Aggrastat for 6 hours. Optimize medical therapy with statin, beta blocker, ARB. If EF <35% by Echo consider Entresto. May also need to consider Lifevest at DC.    Echo 02/10/2019 1. The left ventricle has moderately reduced systolic  function, with an ejection fraction of 35-40%. The cavity size was normal. Left ventricular diastolic Doppler parameters are consistent with impaired relaxation. 2. Severe hypokinesis of the left ventricular mid anteroseptum and inferoseptum, anteroapex and apical septum. 3. Moderate hypokinesis of the left ventricular basal anteroseptum and basal inferoseptum. 4. The tricuspid valve is normal in structure. 5. The pulmonic valve was normal in structure. 6. The mitral valve is normal in structure. 7. The aortic valve is normal in structure. Mild sclerosis of the aortic valve.  SUMMARY  Endocardial border not optimally visualized for wall motion abnormalities, future exams should be performed with Definity IV LV Enhancement  Echo 05/07/19: IMPRESSIONS    1. The left ventricle has normal systolic function with an ejection fraction of 55-60%. The cavity size was normal. There is moderate concentric left  ventricular hypertrophy. Left ventricular diastolic Doppler parameters are consistent with impaired  relaxation. There is hypokinesis of the mid anteroseptal and apical anterior walls.  2. The right ventricle has normal systolic function. The cavity was normal. There is no increase in right ventricular wall thickness.  3. Left atrial size was mildly dilated.   Carotid dopplers 05/07/19:  CLINICAL DATA:  Syncopal episode earlier today. History of CAD, hypertension and hyperlipidemia.  EXAM: BILATERAL CAROTID DUPLEX ULTRASOUND  TECHNIQUE: Wallace CullensGray scale imaging, color Doppler and duplex ultrasound were performed of bilateral carotid and vertebral arteries in the neck.  COMPARISON:  None.  FINDINGS: Criteria: Quantification of carotid stenosis is based on velocity parameters that correlate the residual internal carotid diameter with NASCET-based stenosis levels, using the diameter of the distal internal carotid lumen as the denominator for stenosis measurement.  The  following velocity measurements were obtained:  RIGHT  ICA: 177/28 cm/sec  CCA: 119/13 cm/sec  SYSTOLIC ICA/CCA RATIO:  1.5  ECA: 134 cm/sec  LEFT  ICA: 300/64 cm/sec  CCA: 113/11 cm/sec  SYSTOLIC ICA/CCA RATIO:  2.6  ECA: 141 cm/sec  RIGHT CAROTID ARTERY: There is a moderate amount of circumferential mixed echogenic plaque within the right carotid bulb (images 14 and 16), extending to involve the origin and proximal aspects of the right internal carotid artery (is 24, which results in elevated peak systolic velocities throughout the interrogated course the right internal carotid artery. Greatest acquired peak systolic velocity within the mid aspect the right internal carotid artery measures 177 centimeters/second (image 29).  RIGHT VERTEBRAL ARTERY:  Antegrade Flow  LEFT CAROTID ARTERY: Circumferential intimal thickening/hypoechoic plaque throughout the left common carotid artery (image 41, 44 and 45). There is a moderate to large amount of eccentric mixed echogenic plaque within the left carotid bulb (image 50), extending to involve the origin and proximal aspect the left internal carotid artery (image 58, which results in elevated peak systolic velocities within the proximal mid aspects of the left internal carotid artery. Greatest acquired peak systolic velocity within the proximal left ICA measures 300 centimeters/second (image 60).  LEFT VERTEBRAL ARTERY:  Antegrade Flow  IMPRESSION: Moderate to large amount of bilateral atherosclerotic plaque, right greater than left, results in elevated peak systolic velocities within the bilateral internal carotid arteries, the right >70%, and the left compatible with the 50-69% luminal narrowing range.  Further evaluation with CTA could be performed as clinically indicated.   Electronically Signed   By: Simonne ComeJohn  Watts M.D.   On: 05/07/2019 11:01  Labs/Other Tests and Data Reviewed:    EKG:  No ECG  reviewed.  Recent Labs: 02/09/2019: TSH 0.648 04/10/2019: ALT 22 05/07/2019: B Natriuretic Peptide 167.0; BUN 16; Creatinine, Ser 0.74; Hemoglobin 13.2; Magnesium 2.2; Platelets 176; Potassium 3.6; Sodium 140   Recent Lipid Panel Lab Results  Component Value Date/Time   CHOL 133 04/10/2019 11:12 AM   TRIG 100 04/10/2019 11:12 AM   HDL 52 04/10/2019 11:12 AM   CHOLHDL 2.6 04/10/2019 11:12 AM   LDLCALC 61 04/10/2019 11:12 AM    Wt Readings from Last 3 Encounters:  12/08/19 183 lb (83 kg)  09/19/19 180 lb 8.9 oz (81.9 kg)  06/27/19 184 lb 8.4 oz (83.7 kg)     Objective:    Vital Signs:  BP (!) 148/72   Pulse 64   Ht 5\' 6"  (1.676 m)   Wt 183 lb (83 kg)   SpO2 95%   BMI 29.54 kg/m    GENERAL:  Well appearing WF  in NAD HEENT:  PERRL, EOMI, sclera are clear. Oropharynx is clear. NECK:  No jugular venous distention, carotid upstroke brisk and symmetric, no bruits, no thyromegaly or adenopathy LUNGS:  Clear to auscultation bilaterally CHEST:  Unremarkable HEART:  RRR,  PMI not displaced or sustained,S1 and S2 within normal limits, no S3, no S4: no clicks, no rubs, no murmurs ABD:  Soft, nontender. BS +, no masses or bruits. No hepatomegaly, no splenomegaly EXT:  2 + pulses throughout, no edema, no cyanosis no clubbing SKIN:  Warm and dry.  No rashes NEURO:  Alert and oriented x 3. Cranial nerves II through XII intact. PSYCH:  Cognitively intact    ASSESSMENT & PLAN:    1. CAD: Anterior STEMI with DES to ostial LAD on February 09, 2019.  Continue to have 80% mid to distal LAD lesion that will be treated medically. No angina.   Continue aspirin and Brilinta for one year then stop Brilinta. Continue beta blocker and statin   2. Ischemic cardiomyopathy: Echo shows normalization of LV function Continue carvedilol and losartan.    3. Hypertension: Blood pressure is well controlled.  4. Hyperlipidemia: excellent control on lipitor 80 mg daily.   5. Carotid arterial disease-  moderate bilateral. Continue risk factor modification. Repeat dopplers in 6 months.  6.  Diarrhea- unclear etiology. Recommend stopping doxycycline and taking a probiotic. If no improvement then follow up with primary care.      Medication Changes: No orders of the defined types were placed in this encounter.   Follow Up:  In Person in 6 month(s)  Signed, Demetra Moya [place], MD  12/08/2019 2:54 PM    [facility]

## 2019-12-08 ENCOUNTER — Other Ambulatory Visit: Payer: Self-pay

## 2019-12-08 ENCOUNTER — Ambulatory Visit: Payer: Medicare Other | Admitting: Cardiology

## 2019-12-08 ENCOUNTER — Encounter: Payer: Self-pay | Admitting: Cardiology

## 2019-12-08 VITALS — BP 148/72 | HR 64 | Ht 66.0 in | Wt 183.0 lb

## 2019-12-08 DIAGNOSIS — I6523 Occlusion and stenosis of bilateral carotid arteries: Secondary | ICD-10-CM | POA: Diagnosis not present

## 2019-12-08 DIAGNOSIS — I10 Essential (primary) hypertension: Secondary | ICD-10-CM

## 2019-12-08 DIAGNOSIS — E785 Hyperlipidemia, unspecified: Secondary | ICD-10-CM

## 2019-12-08 DIAGNOSIS — I2510 Atherosclerotic heart disease of native coronary artery without angina pectoris: Secondary | ICD-10-CM | POA: Diagnosis not present

## 2019-12-08 NOTE — Patient Instructions (Signed)
You may stop taking Brilinta after Feb 10, 2020  I would hold doxycycline and try a probiotic like Activia  If diarrhea persists follow up with primary MD  Follow up in 6 months

## 2019-12-28 ENCOUNTER — Other Ambulatory Visit: Payer: Self-pay | Admitting: Cardiology

## 2020-01-09 ENCOUNTER — Other Ambulatory Visit: Payer: Self-pay | Admitting: Cardiology

## 2020-04-02 ENCOUNTER — Other Ambulatory Visit: Payer: Self-pay | Admitting: Cardiology

## 2020-05-02 ENCOUNTER — Other Ambulatory Visit: Payer: Self-pay

## 2020-05-02 ENCOUNTER — Ambulatory Visit (HOSPITAL_COMMUNITY)
Admission: RE | Admit: 2020-05-02 | Discharge: 2020-05-02 | Disposition: A | Discharge status: Home / Self Care | Payer: Medicare PPO | Source: Ambulatory Visit | Attending: Cardiology | Admitting: Cardiology

## 2020-05-02 DIAGNOSIS — I6523 Occlusion and stenosis of bilateral carotid arteries: Secondary | ICD-10-CM | POA: Diagnosis not present

## 2020-05-07 ENCOUNTER — Telehealth: Payer: Self-pay

## 2020-05-07 DIAGNOSIS — I6523 Occlusion and stenosis of bilateral carotid arteries: Secondary | ICD-10-CM

## 2020-05-07 NOTE — Telephone Encounter (Signed)
Spoke to patient carotid doppler results given.Advised to repeat in 12 months. °

## 2020-05-10 DIAGNOSIS — I2510 Atherosclerotic heart disease of native coronary artery without angina pectoris: Secondary | ICD-10-CM | POA: Diagnosis not present

## 2020-05-10 DIAGNOSIS — Z0001 Encounter for general adult medical examination with abnormal findings: Secondary | ICD-10-CM | POA: Diagnosis not present

## 2020-05-10 DIAGNOSIS — Z1389 Encounter for screening for other disorder: Secondary | ICD-10-CM | POA: Diagnosis not present

## 2020-05-10 DIAGNOSIS — E782 Mixed hyperlipidemia: Secondary | ICD-10-CM | POA: Diagnosis not present

## 2020-05-10 DIAGNOSIS — M1991 Primary osteoarthritis, unspecified site: Secondary | ICD-10-CM | POA: Diagnosis not present

## 2020-05-10 DIAGNOSIS — E7849 Other hyperlipidemia: Secondary | ICD-10-CM | POA: Diagnosis not present

## 2020-05-10 DIAGNOSIS — Z6831 Body mass index (BMI) 31.0-31.9, adult: Secondary | ICD-10-CM | POA: Diagnosis not present

## 2020-05-10 DIAGNOSIS — R7309 Other abnormal glucose: Secondary | ICD-10-CM | POA: Diagnosis not present

## 2020-05-10 DIAGNOSIS — E6609 Other obesity due to excess calories: Secondary | ICD-10-CM | POA: Diagnosis not present

## 2020-05-19 ENCOUNTER — Other Ambulatory Visit: Payer: Self-pay | Admitting: Cardiology

## 2020-06-04 ENCOUNTER — Telehealth: Payer: Self-pay | Admitting: Cardiology

## 2020-06-04 NOTE — Telephone Encounter (Signed)
Called patient 06/04/20 to schedule follow up visit with Dr.Jordan from patients recll list. The patient didn't answer, left message for patient to call to get appt scheduled.

## 2020-06-28 NOTE — Progress Notes (Signed)
Date:  07/02/2020   ID:  Rachel Kennedy Jul 27, 1946, MRN [id number]   PCP:  Assunta Found, MD  Cardiologist:  Luian Schumpert [place], MD  Electrophysiologist:  None   Evaluation Performed:  Follow-Up Visit  Chief Complaint:  Follow up CAD  History of Present Illness:    Rachel Kennedy is a 74 y.o. female with PMH of HTN, HLD, and  CAD.  Patient was admitted to the hospital on 02/09/2019 with code STEMI.  Initial EKG was consistent with anterior STEMI.   Initial troponin was 0.24.  Patient underwent emergent cardiac catheterization on 02/09/2019 which revealed 50% proximal to mid RCA lesion, 50% ostial to proximal left circumflex lesion, 80% mid to distal LAD lesion, 100% ostial to proximal LAD occlusion treated with Onyx resolute 3.0 x 30 mm DES, EF 25 to 35%, trivial 1+ MR.  Her troponin peaked at 57.07 before trending back down.  Echocardiogram obtained on the following day showed EF 35 to 40%, moderate hypokinesis of the LV basal anteroseptal and basal inferoseptal region, mild sclerosis of the aortic valve.  Lipid panel obtained during the hospital showed LDL 126, HDL 37.  Patient was discharged on aspirin and Brilinta along with carvedilol and losartan.  Blood pressure in the hospital was too low for Entresto.  She was admitted overnight on 05/07/19 with an episode of near syncope. She was not orthostatic. Troponin negative. Carotid dopplers showed moderate bilateral disease with > 70% on the right. CTA revealed 60 % stenosis if left ICA and 50% in right ICA. Head CT was normal. Repeat carotid dopplers in November showed bilateral 60-79% disease. Repeat dopplers in May showed 50-79% LICA stenosis. Stable.   On follow up today she is doing well. Has been continuing  in Cardiac Rehab at [facility] and BP has been good. No chest pain. No SOB.  No dizziness or palpitations. Her diarrhea resolved with stopping doxycycline. She notes Dr Phillips Odor reduced  lipitor to 40 mg daily due to elevated  transaminases.    Past Medical History:  Diagnosis Date  . Acute systolic (congestive) heart failure (HCC)   . CAD (coronary artery disease) 05/07/2019  . Depression 05/07/2019  . Hyperlipidemia   . Hypertension   . STEMI (ST elevation myocardial infarction) [facility])    Past Surgical History:  Procedure Laterality Date  . CORONARY/GRAFT ACUTE MI REVASCULARIZATION N/A 02/09/2019   Procedure: CORONARY/GRAFT ACUTE MI REVASCULARIZATION;  Surgeon: [place], Dolora Ridgely M, MD;  Location: [facility] INVASIVE CV LAB;  Service: Cardiovascular;  Laterality: N/A;  . FRACTURE SURGERY    . LEFT HEART CATH AND CORONARY ANGIOGRAPHY N/A 02/09/2019   Procedure: LEFT HEART CATH AND CORONARY ANGIOGRAPHY;  Surgeon: [place], Kellyn Mccary M, MD;  Location: [facility] INVASIVE CV LAB;  Service: Cardiovascular;  Laterality: N/A;  . OVARY SURGERY       Current Meds  Medication Sig  . acetaminophen (TYLENOL) 500 MG tablet Take 500 mg by mouth every 6 (six) hours as needed for headache (pain).  Marland Kitchen atorvastatin (LIPITOR) 80 MG tablet TAKE 1 TABLET (80 MG TOTAL) BY MOUTH DAILY AT 6 PM. (Patient taking differently: Take 40 mg by mouth daily at 6 PM. TAKE 1/2 TAB TOTAL 40MG )  . buPROPion (WELLBUTRIN XL) 300 MG 24 hr tablet Take 300 mg by mouth daily.  . carvedilol (COREG) 6.25 MG tablet TAKE 1 TABLET (6.25 MG TOTAL) BY MOUTH 2 (TWO) TIMES DAILY WITH A MEAL.  . cycloSPORINE (RESTASIS) 0.05 % ophthalmic emulsion Place 1 drop into  both eyes 2 (two) times daily.  Marland Kitchen losartan (COZAAR) 25 MG tablet TAKE 1 TABLET BY MOUTH EVERY DAY  . nitroGLYCERIN (NITROSTAT) 0.4 MG SL tablet Place 1 tablet (0.4 mg total) under the tongue every 5 (five) minutes x 3 doses as needed for chest pain.  Marland Kitchen PRESCRIPTION MEDICATION Apply 1 application topically daily as needed (arthritis pain). Arthritis cream compounded at [facility]   Diclofenac 3% Baclofen 2% gabapentin 5% Lidocaine 5% Menthol 1%  . sertraline (ZOLOFT) 100 MG tablet Take 100 mg by mouth at bedtime.    . [DISCONTINUED] BRILINTA 90 MG TABS tablet TAKE 1 TABLET (90 MG TOTAL) BY MOUTH 2 (TWO) TIMES DAILY.  . [DISCONTINUED] doxycycline (PERIOSTAT) 20 MG tablet Take 20 mg by mouth 2 (two) times daily.  . [DISCONTINUED] zolpidem (AMBIEN) 10 MG tablet Take 10 mg by mouth at bedtime as needed for sleep.      Allergies:   Patient has no known allergies.   Social History   Tobacco Use  . Smoking status: Former Games developer  . Smokeless tobacco: Never Used  . Tobacco comment: quit in 2017. Smoked for 30 years prior to that.   Vaping Use  . Vaping Use: Never used  Substance Use Topics  . Alcohol use: No    Alcohol/week: 0.0 standard drinks  . Drug use: No     Family Hx: The patient's family history includes Heart disease in her father; Transient ischemic attack in her mother.  ROS:   Please see the history of present illness.    All other systems reviewed and are negative.   Prior CV studies:   The following studies were reviewed today:  Cath 02/09/2019  Prox RCA to Mid RCA lesion is 50% stenosed.  Ost Cx to Prox Cx lesion is 30% stenosed.  Mid LAD to Dist LAD lesion is 80% stenosed.  Ost LAD to Prox LAD lesion is 100% stenosed.  Post intervention, there is a 0% residual stenosis.  A drug-eluting stent was successfully placed using a STENT RESOLUTE ONYX 3.0X30.  There is severe left ventricular systolic dysfunction.  LV end diastolic pressure is normal.  The left ventricular ejection fraction is 25-35% by visual estimate.  There is trivial (1+) mitral regurgitation.  1. 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 Diffuse 50% proximal to mid RCA 3. Severe LV dysfunction with anterolateral akinesis. EF 30-35%. 4. Normal LVEDP 5. Successful PCI with stenting of the proximal LAD with DES. There is persistent diffuse narrowing of the mid to distal LAD of unclear etiology ? Spasm  Plan: DAPT for one year. Aggrastat for 6 hours. Optimize medical therapy with statin,  beta blocker, ARB. If EF <35% by Echo consider Entresto. May also need to consider Lifevest at DC.    Echo 02/10/2019 1. The left ventricle has moderately reduced systolic function, with an ejection fraction of 35-40%. The cavity size was normal. Left ventricular diastolic Doppler parameters are consistent with impaired relaxation. 2. Severe hypokinesis of the left ventricular mid anteroseptum and inferoseptum, anteroapex and apical septum. 3. Moderate hypokinesis of the left ventricular basal anteroseptum and basal inferoseptum. 4. The tricuspid valve is normal in structure. 5. The pulmonic valve was normal in structure. 6. The mitral valve is normal in structure. 7. The aortic valve is normal in structure. Mild sclerosis of the aortic valve.  SUMMARY  Endocardial border not optimally visualized for wall motion abnormalities, future exams should be performed with Definity IV LV Enhancement  Echo 05/07/19: IMPRESSIONS  1. The left ventricle has normal systolic function with an ejection fraction of 55-60%. The cavity size was normal. There is moderate concentric left ventricular hypertrophy. Left ventricular diastolic Doppler parameters are consistent with impaired  relaxation. There is hypokinesis of the mid anteroseptal and apical anterior walls.  2. The right ventricle has normal systolic function. The cavity was normal. There is no increase in right ventricular wall thickness.  3. Left atrial size was mildly dilated.   Carotid dopplers 05/07/19:  CLINICAL DATA:  Syncopal episode earlier today. History of CAD, hypertension and hyperlipidemia.  EXAM: BILATERAL CAROTID DUPLEX ULTRASOUND  TECHNIQUE: Wallace CullensGray scale imaging, color Doppler and duplex ultrasound were performed of bilateral carotid and vertebral arteries in the neck.  COMPARISON:  None.  FINDINGS: Criteria: Quantification of carotid stenosis is based on velocity parameters that correlate the residual  internal carotid diameter with NASCET-based stenosis levels, using the diameter of the distal internal carotid lumen as the denominator for stenosis measurement.  The following velocity measurements were obtained:  RIGHT  ICA: 177/28 cm/sec  CCA: 119/13 cm/sec  SYSTOLIC ICA/CCA RATIO:  1.5  ECA: 134 cm/sec  LEFT  ICA: 300/64 cm/sec  CCA: 113/11 cm/sec  SYSTOLIC ICA/CCA RATIO:  2.6  ECA: 141 cm/sec  RIGHT CAROTID ARTERY: There is a moderate amount of circumferential mixed echogenic plaque within the right carotid bulb (images 14 and 16), extending to involve the origin and proximal aspects of the right internal carotid artery (is 24, which results in elevated peak systolic velocities throughout the interrogated course the right internal carotid artery. Greatest acquired peak systolic velocity within the mid aspect the right internal carotid artery measures 177 centimeters/second (image 29).  RIGHT VERTEBRAL ARTERY:  Antegrade Flow  LEFT CAROTID ARTERY: Circumferential intimal thickening/hypoechoic plaque throughout the left common carotid artery (image 41, 44 and 45). There is a moderate to large amount of eccentric mixed echogenic plaque within the left carotid bulb (image 50), extending to involve the origin and proximal aspect the left internal carotid artery (image 58, which results in elevated peak systolic velocities within the proximal mid aspects of the left internal carotid artery. Greatest acquired peak systolic velocity within the proximal left ICA measures 300 centimeters/second (image 60).  LEFT VERTEBRAL ARTERY:  Antegrade Flow  IMPRESSION: Moderate to large amount of bilateral atherosclerotic plaque, right greater than left, results in elevated peak systolic velocities within the bilateral internal carotid arteries, the right >70%, and the left compatible with the 50-69% luminal narrowing range.  Further evaluation with CTA could  be performed as clinically indicated.   Electronically Signed   By: Simonne ComeJohn  Watts M.D.   On: 05/07/2019 11:01  Labs/Other Tests and Data Reviewed:    EKG:  Today shows NSR rate 57. Normal. I have personally reviewed and interpreted this study.   Recent Labs: No results found for requested labs within last 8760 hours.   Recent Lipid Panel Lab Results  Component Value Date/Time   CHOL 133 04/10/2019 11:12 AM   TRIG 100 04/10/2019 11:12 AM   HDL 52 04/10/2019 11:12 AM   CHOLHDL 2.6 04/10/2019 11:12 AM   LDLCALC 61 04/10/2019 11:12 AM   Dated 05/10/20: A1c 5.9%. cholesterol 121, triglycerides 107, HDL 50, LDL 51. CBC, TSH normal. AST 55, ALT 54.    Wt Readings from Last 3 Encounters:  07/02/20 191 lb 6.4 oz (86.8 kg)  12/08/19 183 lb (83 kg)  09/19/19 180 lb 8.9 oz (81.9 kg)     Objective:  Vital Signs:  BP 130/75   Pulse (!) 57   Temp (!) 97 F (36.1 C)   Ht 5\' 7"  (1.702 m)   Wt 191 lb 6.4 oz (86.8 kg)   SpO2 91%   BMI 29.98 kg/m    GENERAL:  Well appearing WF in NAD HEENT:  PERRL, EOMI, sclera are clear. Oropharynx is clear. NECK:  No jugular venous distention, carotid upstroke brisk and symmetric, no bruits, no thyromegaly or adenopathy LUNGS:  Clear to auscultation bilaterally CHEST:  Unremarkable HEART:  RRR,  PMI not displaced or sustained,S1 and S2 within normal limits, no S3, no S4: no clicks, no rubs, no murmurs ABD:  Soft, nontender. BS +, no masses or bruits. No hepatomegaly, no splenomegaly EXT:  2 + pulses throughout, no edema, no cyanosis no clubbing SKIN:  Warm and dry.  No rashes NEURO:  Alert and oriented x 3. Cranial nerves II through XII intact. PSYCH:  Cognitively intact    ASSESSMENT & PLAN:    1. CAD: Anterior STEMI with DES to ostial LAD on February 09, 2019.  Continue to have 80% mid to distal LAD lesion that will be treated medically. No angina. On ASA only now.   Continue beta blocker and statin   2. Ischemic cardiomyopathy: Echo  shows normalization of LV function Continue carvedilol and losartan.    3. Hypertension: Blood pressure is well controlled.  4. Hyperlipidemia: excellent control on lipitor 80 mg daily.  Dose reduced to 40 mg daily due to elevated transaminases. Recommend she have this rechecked in 3 months.   5. Carotid arterial disease- moderate bilateral. Continue risk factor modification. Repeat dopplers yearly.       Medication Changes: No orders of the defined types were placed in this encounter.   Follow Up:  In Person in 6 month(s)  Signed, Brentyn Seehafer February 11, 2019, MD  07/02/2020 11:58 AM    [facility]

## 2020-07-02 ENCOUNTER — Ambulatory Visit: Payer: Medicare PPO | Admitting: Cardiology

## 2020-07-02 ENCOUNTER — Encounter: Payer: Self-pay | Admitting: Cardiology

## 2020-07-02 ENCOUNTER — Other Ambulatory Visit: Payer: Self-pay

## 2020-07-02 VITALS — BP 130/75 | HR 57 | Temp 97.0°F | Ht 67.0 in | Wt 191.4 lb

## 2020-07-02 DIAGNOSIS — E785 Hyperlipidemia, unspecified: Secondary | ICD-10-CM | POA: Diagnosis not present

## 2020-07-02 DIAGNOSIS — I6523 Occlusion and stenosis of bilateral carotid arteries: Secondary | ICD-10-CM

## 2020-07-02 DIAGNOSIS — I10 Essential (primary) hypertension: Secondary | ICD-10-CM

## 2020-07-02 DIAGNOSIS — I2510 Atherosclerotic heart disease of native coronary artery without angina pectoris: Secondary | ICD-10-CM | POA: Diagnosis not present

## 2020-07-02 NOTE — Patient Instructions (Signed)
Repeat lipid panel and liver function tests in Sept or October.   I will see you in 6 months

## 2020-08-12 DIAGNOSIS — Z1159 Encounter for screening for other viral diseases: Secondary | ICD-10-CM | POA: Diagnosis not present

## 2020-09-03 DIAGNOSIS — Z6830 Body mass index (BMI) 30.0-30.9, adult: Secondary | ICD-10-CM | POA: Diagnosis not present

## 2020-09-03 DIAGNOSIS — J22 Unspecified acute lower respiratory infection: Secondary | ICD-10-CM | POA: Diagnosis not present

## 2020-09-03 DIAGNOSIS — R110 Nausea: Secondary | ICD-10-CM | POA: Diagnosis not present

## 2020-09-03 DIAGNOSIS — U071 COVID-19: Secondary | ICD-10-CM | POA: Diagnosis not present

## 2020-10-14 ENCOUNTER — Other Ambulatory Visit (HOSPITAL_COMMUNITY): Payer: Self-pay | Admitting: Family Medicine

## 2020-10-14 DIAGNOSIS — Z1231 Encounter for screening mammogram for malignant neoplasm of breast: Secondary | ICD-10-CM

## 2020-11-03 ENCOUNTER — Other Ambulatory Visit: Payer: Self-pay | Admitting: Cardiology

## 2020-11-06 ENCOUNTER — Ambulatory Visit (HOSPITAL_COMMUNITY)
Admission: RE | Admit: 2020-11-06 | Discharge: 2020-11-06 | Disposition: A | Discharge status: Home / Self Care | Payer: Medicare PPO | Source: Ambulatory Visit | Attending: Family Medicine | Admitting: Family Medicine

## 2020-11-06 ENCOUNTER — Other Ambulatory Visit: Payer: Self-pay

## 2020-11-06 DIAGNOSIS — Z1231 Encounter for screening mammogram for malignant neoplasm of breast: Secondary | ICD-10-CM

## 2020-12-30 DIAGNOSIS — I10 Essential (primary) hypertension: Secondary | ICD-10-CM | POA: Diagnosis not present

## 2020-12-30 DIAGNOSIS — I2510 Atherosclerotic heart disease of native coronary artery without angina pectoris: Secondary | ICD-10-CM | POA: Diagnosis not present

## 2020-12-30 DIAGNOSIS — E785 Hyperlipidemia, unspecified: Secondary | ICD-10-CM | POA: Diagnosis not present

## 2020-12-30 DIAGNOSIS — I6523 Occlusion and stenosis of bilateral carotid arteries: Secondary | ICD-10-CM | POA: Diagnosis not present

## 2020-12-30 LAB — LIPID PANEL
Chol/HDL Ratio: 2.6 ratio (ref 0.0–4.4)
Cholesterol, Total: 135 mg/dL (ref 100–199)
HDL: 52 mg/dL (ref >39)
LDL Chol Calc (NIH): 69 mg/dL (ref 0–99)
Triglycerides: 71 mg/dL (ref 0–149)
VLDL Cholesterol Cal: 14 mg/dL (ref 5–40)

## 2020-12-30 LAB — HEPATIC FUNCTION PANEL
ALT: 88 IU/L — ABNORMAL HIGH (ref 0–32)
AST: 92 IU/L — ABNORMAL HIGH (ref 0–40)
Albumin: 4.4 g/dL (ref 3.7–4.7)
Alkaline Phosphatase: 98 IU/L (ref 44–121)
Bilirubin Total: 0.4 mg/dL (ref 0.0–1.2)
Bilirubin, Direct: 0.18 mg/dL (ref 0.00–0.40)
Total Protein: 6.9 g/dL (ref 6.0–8.5)

## 2020-12-31 ENCOUNTER — Other Ambulatory Visit: Payer: Self-pay

## 2020-12-31 DIAGNOSIS — R7401 Elevation of levels of liver transaminase levels: Secondary | ICD-10-CM

## 2021-01-06 ENCOUNTER — Ambulatory Visit (HOSPITAL_COMMUNITY): Payer: Medicare PPO

## 2021-01-07 ENCOUNTER — Encounter (HOSPITAL_COMMUNITY): Payer: Medicare PPO

## 2021-01-12 NOTE — Progress Notes (Addendum)
Date:  01/12/2021   ID:  Rachel Kennedy, Rachel Kennedy 26-Jun-1946, MRN [id number]   PCP:  Assunta Found, MD  Cardiologist:  Nikoli Nasser [place], MD  Electrophysiologist:  None   Evaluation Performed:  Follow-Up Visit  Chief Complaint:  Follow up CAD  History of Present Illness:    Rachel Kennedy is a 75 y.o. female with PMH of HTN, HLD, and  CAD.  Patient was admitted to the hospital on 02/09/2019 with code STEMI.  Initial EKG was consistent with anterior STEMI.   Initial troponin was 0.24.  Patient underwent emergent cardiac catheterization on 02/09/2019 which revealed 50% proximal to mid RCA lesion, 50% ostial to proximal left circumflex lesion, 80% mid to distal LAD lesion, 100% ostial to proximal LAD occlusion treated with Onyx resolute 3.0 x 30 mm DES, EF 25 to 35%, trivial 1+ MR.  Her troponin peaked at 57.07 before trending back down.  Echocardiogram obtained on the following day showed EF 35 to 40%, moderate hypokinesis of the LV basal anteroseptal and basal inferoseptal region, mild sclerosis of the aortic valve.  Lipid panel obtained during the hospital showed LDL 126, HDL 37.  Patient was discharged on aspirin and Brilinta along with carvedilol and losartan.  Blood pressure in the hospital was too low for Entresto.  She was admitted overnight on 05/07/19 with an episode of near syncope. She was not orthostatic. Troponin negative. Echo showed normalization of LV function. Carotid dopplers showed moderate bilateral disease with > 70% on the right. CTA revealed 60 % stenosis if left ICA and 50% in right ICA. Head CT was normal. Repeat carotid dopplers in November showed bilateral 60-79% disease. Repeat dopplers in May showed 50-79% LICA stenosis. Stable.   On follow up today she is doing well. Has been continuing  in Cardiac Rehab at [facility].  No chest pain. No SOB.  No dizziness or palpitations. She did have Covid August-September. Has recovered.  Previously lipitor was reduced to 40 mg daily due  to elevated transaminases. On recent labs transaminases still elevated.    Past Medical History:  Diagnosis Date  . Acute systolic (congestive) heart failure (HCC)   . CAD (coronary artery disease) 05/07/2019  . Depression 05/07/2019  . Hyperlipidemia   . Hypertension   . STEMI (ST elevation myocardial infarction) [facility])    Past Surgical History:  Procedure Laterality Date  . CORONARY/GRAFT ACUTE MI REVASCULARIZATION N/A 02/09/2019   Procedure: CORONARY/GRAFT ACUTE MI REVASCULARIZATION;  Surgeon: [place], Anav Lammert M, MD;  Location: [facility] INVASIVE CV LAB;  Service: Cardiovascular;  Laterality: N/A;  . FRACTURE SURGERY    . LEFT HEART CATH AND CORONARY ANGIOGRAPHY N/A 02/09/2019   Procedure: LEFT HEART CATH AND CORONARY ANGIOGRAPHY;  Surgeon: [place], Zaylyn Bergdoll M, MD;  Location: [facility] INVASIVE CV LAB;  Service: Cardiovascular;  Laterality: N/A;  . OVARY SURGERY       No outpatient medications have been marked as taking for the 01/15/21 encounter (Appointment) with [place], Nathon Stefanski M, MD.     Allergies:   Patient has no known allergies.   Social History   Tobacco Use  . Smoking status: Former Games developer  . Smokeless tobacco: Never Used  . Tobacco comment: quit in 2017. Smoked for 30 years prior to that.   Vaping Use  . Vaping Use: Never used  Substance Use Topics  . Alcohol use: No    Alcohol/week: 0.0 standard drinks  . Drug use: No     Family Hx: The patient's family history  includes Heart disease in her father; Transient ischemic attack in her mother.  ROS:   Please see the history of present illness.    All other systems reviewed and are negative.   Prior CV studies:   The following studies were reviewed today:  Cath 02/09/2019  Prox RCA to Mid RCA lesion is 50% stenosed.  Ost Cx to Prox Cx lesion is 30% stenosed.  Mid LAD to Dist LAD lesion is 80% stenosed.  Ost LAD to Prox LAD lesion is 100% stenosed.  Post intervention, there is a 0% residual stenosis.  A drug-eluting stent was  successfully placed using a STENT RESOLUTE ONYX 3.0X30.  There is severe left ventricular systolic dysfunction.  LV end diastolic pressure is normal.  The left ventricular ejection fraction is 25-35% by visual estimate.  There is trivial (1+) mitral regurgitation.  1. 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 Diffuse 50% proximal to mid RCA 3. Severe LV dysfunction with anterolateral akinesis. EF 30-35%. 4. Normal LVEDP 5. Successful PCI with stenting of the proximal LAD with DES. There is persistent diffuse narrowing of the mid to distal LAD of unclear etiology ? Spasm  Plan: DAPT for one year. Aggrastat for 6 hours. Optimize medical therapy with statin, beta blocker, ARB. If EF <35% by Echo consider Entresto. May also need to consider Lifevest at DC.    Echo 02/10/2019 1. The left ventricle has moderately reduced systolic function, with an ejection fraction of 35-40%. The cavity size was normal. Left ventricular diastolic Doppler parameters are consistent with impaired relaxation. 2. Severe hypokinesis of the left ventricular mid anteroseptum and inferoseptum, anteroapex and apical septum. 3. Moderate hypokinesis of the left ventricular basal anteroseptum and basal inferoseptum. 4. The tricuspid valve is normal in structure. 5. The pulmonic valve was normal in structure. 6. The mitral valve is normal in structure. 7. The aortic valve is normal in structure. Mild sclerosis of the aortic valve.  SUMMARY  Endocardial border not optimally visualized for wall motion abnormalities, future exams should be performed with Definity IV LV Enhancement  Echo 05/07/19: IMPRESSIONS    1. The left ventricle has normal systolic function with an ejection fraction of 55-60%. The cavity size was normal. There is moderate concentric left ventricular hypertrophy. Left ventricular diastolic Doppler parameters are consistent with impaired  relaxation. There is hypokinesis of  the mid anteroseptal and apical anterior walls.  2. The right ventricle has normal systolic function. The cavity was normal. There is no increase in right ventricular wall thickness.  3. Left atrial size was mildly dilated.   Carotid dopplers 05/07/19:  CLINICAL DATA:  Syncopal episode earlier today. History of CAD, hypertension and hyperlipidemia.  EXAM: BILATERAL CAROTID DUPLEX ULTRASOUND  TECHNIQUE: Wallace CullensGray scale imaging, color Doppler and duplex ultrasound were performed of bilateral carotid and vertebral arteries in the neck.  COMPARISON:  None.  FINDINGS: Criteria: Quantification of carotid stenosis is based on velocity parameters that correlate the residual internal carotid diameter with NASCET-based stenosis levels, using the diameter of the distal internal carotid lumen as the denominator for stenosis measurement.  The following velocity measurements were obtained:  RIGHT  ICA: 177/28 cm/sec  CCA: 119/13 cm/sec  SYSTOLIC ICA/CCA RATIO:  1.5  ECA: 134 cm/sec  LEFT  ICA: 300/64 cm/sec  CCA: 113/11 cm/sec  SYSTOLIC ICA/CCA RATIO:  2.6  ECA: 141 cm/sec  RIGHT CAROTID ARTERY: There is a moderate amount of circumferential mixed echogenic plaque within the right carotid bulb (images 14 and 16),  extending to involve the origin and proximal aspects of the right internal carotid artery (is 24, which results in elevated peak systolic velocities throughout the interrogated course the right internal carotid artery. Greatest acquired peak systolic velocity within the mid aspect the right internal carotid artery measures 177 centimeters/second (image 29).  RIGHT VERTEBRAL ARTERY:  Antegrade Flow  LEFT CAROTID ARTERY: Circumferential intimal thickening/hypoechoic plaque throughout the left common carotid artery (image 41, 44 and 45). There is a moderate to large amount of eccentric mixed echogenic plaque within the left carotid bulb (image 50),  extending to involve the origin and proximal aspect the left internal carotid artery (image 58, which results in elevated peak systolic velocities within the proximal mid aspects of the left internal carotid artery. Greatest acquired peak systolic velocity within the proximal left ICA measures 300 centimeters/second (image 60).  LEFT VERTEBRAL ARTERY:  Antegrade Flow  IMPRESSION: Moderate to large amount of bilateral atherosclerotic plaque, right greater than left, results in elevated peak systolic velocities within the bilateral internal carotid arteries, the right >70%, and the left compatible with the 50-69% luminal narrowing range.  Further evaluation with CTA could be performed as clinically indicated.   Electronically Signed   By: Simonne Come M.D.   On: 05/07/2019 11:01  Labs/Other Tests and Data Reviewed:    EKG:  Today shows NSR rate 57. Normal. I have personally reviewed and interpreted this study.   Recent Labs: 12/30/2020: ALT 88   Recent Lipid Panel Lab Results  Component Value Date/Time   CHOL 135 12/30/2020 09:26 AM   TRIG 71 12/30/2020 09:26 AM   HDL 52 12/30/2020 09:26 AM   CHOLHDL 2.6 12/30/2020 09:26 AM   CHOLHDL 2.6 04/10/2019 11:12 AM   LDLCALC 69 12/30/2020 09:26 AM   Dated 05/10/20: A1c 5.9%. cholesterol 121, triglycerides 107, HDL 50, LDL 51. CBC, TSH normal. AST 55, ALT 54.    Wt Readings from Last 3 Encounters:  07/02/20 191 lb 6.4 oz (86.8 kg)  12/08/19 183 lb (83 kg)  09/19/19 180 lb 8.9 oz (81.9 kg)     Objective:    Vital Signs:  There were no vitals taken for this visit.   GENERAL:  Well appearing WF in NAD HEENT:  PERRL, EOMI, sclera are clear. Oropharynx is clear. NECK:  No jugular venous distention, carotid upstroke brisk and symmetric, no bruits, no thyromegaly or adenopathy LUNGS:  Clear to auscultation bilaterally CHEST:  Unremarkable HEART:  RRR,  PMI not displaced or sustained,S1 and S2 within normal limits, no S3, no  S4: no clicks, no rubs, no murmurs ABD:  Soft, nontender. BS +, no masses or bruits. No hepatomegaly, no splenomegaly EXT:  2 + pulses throughout, no edema, no cyanosis no clubbing SKIN:  Warm and dry.  No rashes NEURO:  Alert and oriented x 3. Cranial nerves II through XII intact. PSYCH:  Cognitively intact    ASSESSMENT & PLAN:    1. CAD: Anterior STEMI with DES to ostial LAD on February 09, 2019.  Residual 80% mid to distal LAD lesion that will be treated medically. No angina. On ASA only now. Continue beta blocker and statin   2. Ischemic cardiomyopathy: Echo showed normalization of LV function in May 2020. Continue carvedilol and losartan.    3. Hypertension: Blood pressure is significantly elevated today. Will request record of BP readings from Rehab. If high will increase losartan.  4. Hyperlipidemia: excellent control on lipitor but with  elevated transaminases. Recommend holding lipitor. Repeat LFTs  in one month. If normal will try Crestor. If unable to tolerate this will need to consider Repatha or Nexlizet.   5. Carotid arterial disease- moderate bilateral. Continue risk factor modification. Recent dopplers unchanged. Follow yearly   Medication Changes: No orders of the defined types were placed in this encounter.   Follow Up:  In Person in 6 month(s)  Signed, Bich Mchaney [place], MD  01/12/2021 9:09 AM    [facility]  Addendum: Reviewed records from Cardiac Rehab. BP has been well controlled 114-142 systolic, 58-68 diastolic. Will continue to monitor.   Glennon Kopko [place] MD, [facility]

## 2021-01-13 ENCOUNTER — Other Ambulatory Visit: Payer: Self-pay

## 2021-01-13 ENCOUNTER — Ambulatory Visit (HOSPITAL_COMMUNITY)
Admission: RE | Admit: 2021-01-13 | Discharge: 2021-01-13 | Disposition: A | Discharge status: Home / Self Care | Payer: Medicare PPO | Source: Ambulatory Visit | Attending: Cardiology | Admitting: Cardiology

## 2021-01-13 ENCOUNTER — Other Ambulatory Visit: Payer: Self-pay | Admitting: Cardiology

## 2021-01-13 DIAGNOSIS — I6523 Occlusion and stenosis of bilateral carotid arteries: Secondary | ICD-10-CM | POA: Diagnosis not present

## 2021-01-15 ENCOUNTER — Encounter: Payer: Self-pay | Admitting: Cardiology

## 2021-01-15 ENCOUNTER — Other Ambulatory Visit: Payer: Self-pay

## 2021-01-15 ENCOUNTER — Ambulatory Visit: Payer: Medicare PPO | Admitting: Cardiology

## 2021-01-15 VITALS — BP 168/70 | HR 61 | Ht 66.0 in | Wt 183.4 lb

## 2021-01-15 DIAGNOSIS — I10 Essential (primary) hypertension: Secondary | ICD-10-CM

## 2021-01-15 DIAGNOSIS — E785 Hyperlipidemia, unspecified: Secondary | ICD-10-CM

## 2021-01-15 DIAGNOSIS — I6523 Occlusion and stenosis of bilateral carotid arteries: Secondary | ICD-10-CM

## 2021-01-15 DIAGNOSIS — I2510 Atherosclerotic heart disease of native coronary artery without angina pectoris: Secondary | ICD-10-CM | POA: Diagnosis not present

## 2021-01-15 NOTE — Progress Notes (Signed)
vas 

## 2021-01-16 ENCOUNTER — Telehealth: Payer: Self-pay

## 2021-01-16 NOTE — Telephone Encounter (Signed)
Spoke to patient Dr.Jordan reviewed B/P reading from [facility] cardiac rehab.He advised B/P readings are good.Continue same medications.

## 2021-02-10 DIAGNOSIS — R7401 Elevation of levels of liver transaminase levels: Secondary | ICD-10-CM | POA: Diagnosis not present

## 2021-02-11 ENCOUNTER — Other Ambulatory Visit: Payer: Self-pay

## 2021-02-11 DIAGNOSIS — E785 Hyperlipidemia, unspecified: Secondary | ICD-10-CM

## 2021-02-11 LAB — HEPATIC FUNCTION PANEL
ALT: 30 IU/L (ref 0–32)
AST: 30 IU/L (ref 0–40)
Albumin: 4.3 g/dL (ref 3.7–4.7)
Alkaline Phosphatase: 96 IU/L (ref 44–121)
Bilirubin Total: 0.4 mg/dL (ref 0.0–1.2)
Bilirubin, Direct: 0.13 mg/dL (ref 0.00–0.40)
Total Protein: 6.9 g/dL (ref 6.0–8.5)

## 2021-02-11 MED ORDER — ROSUVASTATIN CALCIUM 10 MG PO TABS: 10.0000 mg | ORAL_TABLET | Freq: Every day | ORAL | 3 refills | Status: DC

## 2021-02-11 NOTE — Progress Notes (Signed)
creat 

## 2021-05-12 DIAGNOSIS — E785 Hyperlipidemia, unspecified: Secondary | ICD-10-CM | POA: Diagnosis not present

## 2021-05-13 LAB — HEPATIC FUNCTION PANEL
ALT: 22 IU/L (ref 0–32)
AST: 21 IU/L (ref 0–40)
Albumin: 4.4 g/dL (ref 3.7–4.7)
Alkaline Phosphatase: 94 IU/L (ref 44–121)
Bilirubin Total: 0.6 mg/dL (ref 0.0–1.2)
Bilirubin, Direct: 0.17 mg/dL (ref 0.00–0.40)
Total Protein: 6.9 g/dL (ref 6.0–8.5)

## 2021-05-13 LAB — LIPID PANEL
Chol/HDL Ratio: 2.5 ratio (ref 0.0–4.4)
Cholesterol, Total: 141 mg/dL (ref 100–199)
HDL: 56 mg/dL (ref >39)
LDL Chol Calc (NIH): 67 mg/dL (ref 0–99)
Triglycerides: 97 mg/dL (ref 0–149)
VLDL Cholesterol Cal: 18 mg/dL (ref 5–40)

## 2021-05-28 DIAGNOSIS — L309 Dermatitis, unspecified: Secondary | ICD-10-CM | POA: Diagnosis not present

## 2021-05-28 DIAGNOSIS — Z1331 Encounter for screening for depression: Secondary | ICD-10-CM | POA: Diagnosis not present

## 2021-05-28 DIAGNOSIS — R7309 Other abnormal glucose: Secondary | ICD-10-CM | POA: Diagnosis not present

## 2021-05-28 DIAGNOSIS — Z6832 Body mass index (BMI) 32.0-32.9, adult: Secondary | ICD-10-CM | POA: Diagnosis not present

## 2021-05-28 DIAGNOSIS — E6609 Other obesity due to excess calories: Secondary | ICD-10-CM | POA: Diagnosis not present

## 2021-05-28 DIAGNOSIS — I2510 Atherosclerotic heart disease of native coronary artery without angina pectoris: Secondary | ICD-10-CM | POA: Diagnosis not present

## 2021-05-28 DIAGNOSIS — E7849 Other hyperlipidemia: Secondary | ICD-10-CM | POA: Diagnosis not present

## 2021-05-28 DIAGNOSIS — Z0001 Encounter for general adult medical examination with abnormal findings: Secondary | ICD-10-CM | POA: Diagnosis not present

## 2021-05-28 DIAGNOSIS — M1991 Primary osteoarthritis, unspecified site: Secondary | ICD-10-CM | POA: Diagnosis not present

## 2021-08-08 NOTE — Progress Notes (Signed)
Date:  08/11/2021   ID:  Rachel Kennedy, DOB 01/25/1946, MRN [id number]   PCP:  Assunta FoundGolding, John, MD  Cardiologist:  Bradely Rudin SwazilandJordan, MD  Electrophysiologist:  None   Evaluation Performed:  Follow-Up Visit  Chief Complaint:  Follow up CAD  History of Present Illness:    Rachel Kennedy is a 75 y.o. female with PMH of HTN, HLD, and  CAD.  Patient was admitted to the hospital on 02/09/2019 with code STEMI.  Initial EKG was consistent with anterior STEMI.   Initial troponin was 0.24.  Patient underwent emergent cardiac catheterization on 02/09/2019 which revealed 50% proximal to mid RCA lesion, 50% ostial to proximal left circumflex lesion, 80% mid to distal LAD lesion, 100% ostial to proximal LAD occlusion treated with Onyx resolute 3.0 x 30 mm DES, EF 25 to 35%, trivial 1+ MR.  Her troponin peaked at 57.07 before trending back down.  Echocardiogram obtained on the following day showed EF 35 to 40%, moderate hypokinesis of the LV basal anteroseptal and basal inferoseptal region, mild sclerosis of the aortic valve.  Lipid panel obtained during the hospital showed LDL 126, HDL 37.  Patient was discharged on aspirin and Brilinta along with carvedilol and losartan.  Blood pressure in the hospital was too low for Entresto.  She was admitted overnight on 05/07/19 with an episode of near syncope. She was not orthostatic. Troponin negative. Echo showed normalization of LV function. Carotid dopplers showed moderate bilateral disease with > 70% on the right. CTA revealed 60 % stenosis if left ICA and 50% in right ICA. Head CT was normal. Repeat carotid dopplers in November showed bilateral 60-79% disease. Repeat dopplers in May showed 50-79% LICA stenosis. Stable.   On follow up today she is doing well. Has been continuing  [facility]. Reports BP well controlled there.  No chest pain.   No dizziness or palpitations. She did have Covid August-September. Has some mild SOB at times still.    Previously lipitor was reduced to 40 mg daily due to elevated transaminases. Switched to Crestor with good response and normal LFTs.    Past Medical History:  Diagnosis Date   Acute systolic (congestive) heart failure (HCC)    CAD (coronary artery disease) 05/07/2019   Depression 05/07/2019   Hyperlipidemia    Hypertension    STEMI (ST elevation myocardial infarction) [facility](HCC)    Past Surgical History:  Procedure Laterality Date   CORONARY/GRAFT ACUTE MI REVASCULARIZATION N/A 02/09/2019   Procedure: CORONARY/GRAFT ACUTE MI REVASCULARIZATION;  Surgeon: SwazilandJordan, Donya Hitch M, MD;  [facility];  Service: Cardiovascular;  Laterality: N/A;   FRACTURE SURGERY     LEFT HEART CATH AND CORONARY ANGIOGRAPHY N/A 02/09/2019   Procedure: LEFT HEART CATH AND CORONARY ANGIOGRAPHY;  Surgeon: SwazilandJordan, Raylynn Hersh M, MD;  [facility];  Service: Cardiovascular;  Laterality: N/A;   OVARY SURGERY       Current Meds  Medication Sig   acetaminophen (TYLENOL) 500 MG tablet Take 500 mg by mouth every 6 (six) hours as needed for headache (pain).   buPROPion (WELLBUTRIN XL) 300 MG 24 hr tablet Take 300 mg by mouth daily.   carvedilol (COREG) 6.25 MG tablet TAKE 1 TABLET (6.25 MG TOTAL) BY MOUTH 2 (TWO) TIMES DAILY WITH A MEAL.   celecoxib (CELEBREX) 200 MG capsule Take 200 mg by mouth as needed. Takes 1 tablet as needed   cycloSPORINE (RESTASIS) 0.05 % ophthalmic emulsion Place  1 drop into both eyes 2 (two) times daily.   losartan (COZAAR) 25 MG tablet TAKE 1 TABLET BY MOUTH EVERY DAY   nitroGLYCERIN (NITROSTAT) 0.4 MG SL tablet Place 1 tablet (0.4 mg total) under the tongue every 5 (five) minutes x 3 doses as needed for chest pain.   sertraline (ZOLOFT) 100 MG tablet Take 100 mg by mouth at bedtime.    [DISCONTINUED] BRILINTA 90 MG TABS tablet TAKE 1 TABLET (90 MG TOTAL) BY MOUTH 2 (TWO) TIMES DAILY.     Allergies:   Patient has no known allergies.   Social History   Tobacco Use   Smoking  status: Former   Smokeless tobacco: Never   Tobacco comments:    quit in 2017. Smoked for 30 years prior to that.   Vaping Use   Vaping Use: Never used  Substance Use Topics   Alcohol use: No    Alcohol/week: 0.0 standard drinks   Drug use: No     Family Hx: The patient's family history includes Heart disease in her father; Transient ischemic attack in her mother.  ROS:   Please see the history of present illness.    All other systems reviewed and are negative.   Prior CV studies:   The following studies were reviewed today:  Cath 02/09/2019 Prox RCA to Mid RCA lesion is 50% stenosed. Ost Cx to Prox Cx lesion is 30% stenosed. Mid LAD to Dist LAD lesion is 80% stenosed. Ost LAD to Prox LAD lesion is 100% stenosed. Post intervention, there is a 0% residual stenosis. A drug-eluting stent was successfully placed using a STENT RESOLUTE ONYX 3.0X30. There is severe left ventricular systolic dysfunction. LV end diastolic pressure is normal. The left ventricular ejection fraction is 25-35% by visual estimate. There is trivial (1+) mitral regurgitation.   1. 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 Diffuse 50% proximal to mid RCA 3. Severe LV dysfunction with anterolateral akinesis. EF 30-35%. 4. Normal LVEDP 5. Successful PCI with stenting of the proximal LAD with DES. There is persistent diffuse narrowing of the mid to distal LAD of unclear etiology ? Spasm   Plan: DAPT for one year. Aggrastat for 6 hours. Optimize medical therapy with statin, beta blocker, ARB. If EF <35% by Echo consider Entresto. May also need to consider Lifevest at DC.      Echo 02/10/2019  1. The left ventricle has moderately reduced systolic function, with an ejection fraction of 35-40%. The cavity size was normal. Left ventricular diastolic Doppler parameters are consistent with impaired relaxation.  2. Severe hypokinesis of the left ventricular mid anteroseptum and inferoseptum, anteroapex  and apical septum.  3. Moderate hypokinesis of the left ventricular basal anteroseptum and basal inferoseptum.  4. The tricuspid valve is normal in structure.  5. The pulmonic valve was normal in structure.  6. The mitral valve is normal in structure.  7. The aortic valve is normal in structure. Mild sclerosis of the aortic valve.   SUMMARY   Endocardial border not optimally visualized for wall motion abnormalities, future exams should be performed with Definity IV LV Enhancement  Echo 05/07/19: IMPRESSIONS      1. The left ventricle has normal systolic function with an ejection fraction of 55-60%. The cavity size was normal. There is moderate concentric left ventricular hypertrophy. Left ventricular diastolic Doppler parameters are consistent with impaired  relaxation. There is hypokinesis of the mid anteroseptal and apical anterior walls.  2. The right ventricle has normal systolic  function. The cavity was normal. There is no increase in right ventricular wall thickness.  3. Left atrial size was mildly dilated.    Carotid dopplers 05/07/19:  CLINICAL DATA:  Syncopal episode earlier today. History of CAD, hypertension and hyperlipidemia.   EXAM: BILATERAL CAROTID DUPLEX ULTRASOUND   TECHNIQUE: Wallace Cullens scale imaging, color Doppler and duplex ultrasound were performed of bilateral carotid and vertebral arteries in the neck.   COMPARISON:  None.   FINDINGS: Criteria: Quantification of carotid stenosis is based on velocity parameters that correlate the residual internal carotid diameter with NASCET-based stenosis levels, using the diameter of the distal internal carotid lumen as the denominator for stenosis measurement.   The following velocity measurements were obtained:   RIGHT   ICA: 177/28 cm/sec   CCA: 119/13 cm/sec   SYSTOLIC ICA/CCA RATIO:  1.5   ECA: 134 cm/sec   LEFT   ICA: 300/64 cm/sec   CCA: 113/11 cm/sec   SYSTOLIC ICA/CCA RATIO:  2.6   ECA: 141  cm/sec   RIGHT CAROTID ARTERY: There is a moderate amount of circumferential mixed echogenic plaque within the right carotid bulb (images 14 and 16), extending to involve the origin and proximal aspects of the right internal carotid artery (is 24, which results in elevated peak systolic velocities throughout the interrogated course the right internal carotid artery. Greatest acquired peak systolic velocity within the mid aspect the right internal carotid artery measures 177 centimeters/second (image 29).   RIGHT VERTEBRAL ARTERY:  Antegrade Flow   LEFT CAROTID ARTERY: Circumferential intimal thickening/hypoechoic plaque throughout the left common carotid artery (image 41, 44 and 45). There is a moderate to large amount of eccentric mixed echogenic plaque within the left carotid bulb (image 50), extending to involve the origin and proximal aspect the left internal carotid artery (image 58, which results in elevated peak systolic velocities within the proximal mid aspects of the left internal carotid artery. Greatest acquired peak systolic velocity within the proximal left ICA measures 300 centimeters/second (image 60).   LEFT VERTEBRAL ARTERY:  Antegrade Flow   IMPRESSION: Moderate to large amount of bilateral atherosclerotic plaque, right greater than left, results in elevated peak systolic velocities within the bilateral internal carotid arteries, the right >70%, and the left compatible with the 50-69% luminal narrowing range.   Further evaluation with CTA could be performed as clinically indicated.     Electronically Signed   By: Simonne Come M.D.   On: 05/07/2019 11:01  Labs/Other Tests and Data Reviewed:    EKG:  Today shows NSR rate 60. Normal. I have personally reviewed and interpreted this study.   Recent Labs: 05/12/2021: ALT 22   Recent Lipid Panel Lab Results  Component Value Date/Time   CHOL 141 05/12/2021 08:58 AM   TRIG 97 05/12/2021 08:58 AM   HDL 56  05/12/2021 08:58 AM   CHOLHDL 2.5 05/12/2021 08:58 AM   CHOLHDL 2.6 04/10/2019 11:12 AM   LDLCALC 67 05/12/2021 08:58 AM   Dated 05/10/20: A1c 5.9%. cholesterol 121, triglycerides 107, HDL 50, LDL 51. CBC, TSH normal. AST 55, ALT 54.    Wt Readings from Last 3 Encounters:  08/11/21 197 lb 12.8 oz (89.7 kg)  01/15/21 183 lb 6.4 oz (83.2 kg)  07/02/20 191 lb 6.4 oz (86.8 kg)     Objective:    Vital Signs:  BP (!) 150/80 (BP Location: Right Arm)   Pulse 60   Ht 5\' 6"  (1.676 m)   Wt 197 lb 12.8 oz (89.7 kg)  SpO2 94%   BMI 31.93 kg/m    GENERAL:  Well appearing WF in NAD HEENT:  PERRL, EOMI, sclera are clear. Oropharynx is clear. NECK:  No jugular venous distention, carotid upstroke brisk and symmetric, no bruits, no thyromegaly or adenopathy LUNGS:  Clear to auscultation bilaterally CHEST:  Unremarkable HEART:  RRR,  PMI not displaced or sustained,S1 and S2 within normal limits, no S3, no S4: no clicks, no rubs, no murmurs ABD:  Soft, nontender. BS +, no masses or bruits. No hepatomegaly, no splenomegaly EXT:  2 + pulses throughout, no edema, no cyanosis no clubbing SKIN:  Warm and dry.  No rashes NEURO:  Alert and oriented x 3. Cranial nerves II through XII intact. PSYCH:  Cognitively intact    ASSESSMENT & PLAN:    CAD: Anterior STEMI with DES to ostial LAD on February 09, 2019.  Residual 80% mid to distal LAD lesion that will be treated medically. No angina. On ASA only now. Continue beta blocker and statin    Ischemic cardiomyopathy: Echo showed normalization of LV function in May 2020. Continue carvedilol and losartan.     Hypertension: Blood pressure is mildly elevated today but readings at Rehab have been good.    Hyperlipidemia: excellent control on lcrestor. LDL 67. LFTs normal.  5. Carotid arterial disease- moderate bilateral. Continue risk factor modification. Last dopplers unchanged. Follow yearly   Medication Changes: No orders of the defined types were  placed in this encounter.   Follow Up:  In Person in 6 month(s)  Signed, Liticia Gasior [place], MD  08/11/2021 8:48 AM    [facility]

## 2021-08-11 ENCOUNTER — Ambulatory Visit: Payer: Medicare PPO | Admitting: Cardiology

## 2021-08-11 ENCOUNTER — Encounter: Payer: Self-pay | Admitting: Cardiology

## 2021-08-11 ENCOUNTER — Other Ambulatory Visit: Payer: Self-pay

## 2021-08-11 VITALS — BP 150/80 | HR 60 | Ht 66.0 in | Wt 197.8 lb

## 2021-08-11 DIAGNOSIS — I6523 Occlusion and stenosis of bilateral carotid arteries: Secondary | ICD-10-CM | POA: Diagnosis not present

## 2021-08-11 DIAGNOSIS — E785 Hyperlipidemia, unspecified: Secondary | ICD-10-CM

## 2021-08-11 DIAGNOSIS — I10 Essential (primary) hypertension: Secondary | ICD-10-CM | POA: Diagnosis not present

## 2021-08-11 DIAGNOSIS — I2510 Atherosclerotic heart disease of native coronary artery without angina pectoris: Secondary | ICD-10-CM

## 2021-08-11 MED ORDER — ASPIRIN EC 81 MG PO TBEC: 81.0000 mg | DELAYED_RELEASE_TABLET | Freq: Every day | ORAL | 3 refills | Status: AC

## 2022-01-13 ENCOUNTER — Ambulatory Visit (HOSPITAL_COMMUNITY)
Admission: RE | Admit: 2022-01-13 | Discharge: 2022-01-13 | Disposition: A | Discharge status: Home / Self Care | Payer: Medicare PPO | Source: Ambulatory Visit | Attending: Cardiology | Admitting: Cardiology

## 2022-01-13 ENCOUNTER — Other Ambulatory Visit (HOSPITAL_COMMUNITY): Payer: Self-pay | Admitting: Cardiology

## 2022-01-13 ENCOUNTER — Other Ambulatory Visit: Payer: Self-pay

## 2022-01-13 DIAGNOSIS — I6523 Occlusion and stenosis of bilateral carotid arteries: Secondary | ICD-10-CM

## 2022-01-14 ENCOUNTER — Other Ambulatory Visit: Payer: Self-pay

## 2022-01-14 DIAGNOSIS — I6523 Occlusion and stenosis of bilateral carotid arteries: Secondary | ICD-10-CM

## 2022-02-04 ENCOUNTER — Other Ambulatory Visit: Payer: Self-pay | Admitting: Cardiology

## 2022-02-06 NOTE — Progress Notes (Signed)
Cardiology Office Note:    Date:  02/09/2022   ID:  Rachel Kennedy, Rachel Kennedy Oct 10, 1946, MRN [id number]  PCP:  Sharilyn Sites, MD   [facility] Providers Cardiologist:  Peter Martinique, MD     Referring MD: Sharilyn Sites, MD   No chief complaint on file. 24-month follow-up  History of Present Illness:    Rachel Kennedy is a 76 y.o. female with a hx of past medical history of HTN, HLD, CAD. She presented with anterior STEMI at [facility] on (peaked troponin 57.07) and underwent emergent cath revealing 100% ostial to proximal LAD occlusion treated with Onyx resolute 3.0 x 30 mm DES,50% proximal to mid RCA lesion, 50% ostial to proximal left circumflex lesion, 80% mid to distal LAD lesion. Echo performed post PCI revealed EF 35-40% with  moderate hypokinesis of the LV basal anteroseptal and basal inferoseptal region, mild sclerosis of the aortic valve.  She was discharged on DA PT with ASA and Brilinta as well as carvedilol and losartan.  LDL cholesterol was 126 and placed on atorvastatin 80 mg. She was admitted overnight on 05/07/2019 with episode of near syncope and negative troponins. Repeat echo 05/07/2019 showed improved EF of 55-60%, LVH, and mid anterior septal and apical wall hypokinesis.  Carotid duplex performed with moderate bilateral atherosclerotic plaque with right greater than left.  Contracted COVID August/September 2020.  Last follow-up visit 8/22 with Dr. Martinique.  She reported at this visit still has some shortness of breath at times due to COVID infection in 9/20.  Carotid Dopplers completed and reported moderate on 01/13/21.  LDL cholesterol was 50 on Crestor 20 mg and advised to continue risk factor modifications for carotid artery disease.  No medication changes.  Rachel Kennedy presents today for 79-month follow-up. She reports has been feeling good and has no new complaints.  She is exercising at [facility] cardiac rehab 3 days per week. She had COVID in September 2020 and stated that  she is finally feeling like she is getting her breath back.She denies chest pain, shortness of breath, lower extremity edema, fatigue, palpitations, melena, hematuria, hemoptysis, diaphoresis, weakness, presyncope, syncope, orthopnea, and PND. She is on a heart healthy diet.  Past Medical History:  Diagnosis Date   Acute systolic (congestive) heart failure (HCC)    CAD (coronary artery disease) 05/07/2019   Depression 05/07/2019   Hyperlipidemia    Hypertension    STEMI (ST elevation myocardial infarction) [facility])     Past Surgical History:  Procedure Laterality Date   CORONARY/GRAFT ACUTE MI REVASCULARIZATION N/A 02/09/2019   Procedure: CORONARY/GRAFT ACUTE MI REVASCULARIZATION;  Surgeon: Martinique, Peter M, MD;  Location: [place] CV LAB;  Service: Cardiovascular;  Laterality: N/A;   FRACTURE SURGERY     LEFT HEART CATH AND CORONARY ANGIOGRAPHY N/A 02/09/2019   Procedure: LEFT HEART CATH AND CORONARY ANGIOGRAPHY;  Surgeon: Martinique, Peter M, MD;  Location: [place] CV LAB;  Service: Cardiovascular;  Laterality: N/A;   OVARY SURGERY      Current Medications: Current Meds  Medication Sig   acetaminophen (TYLENOL) 500 MG tablet Take 500 mg by mouth every 6 (six) hours as needed for headache (pain).   aspirin EC 81 MG tablet Take 1 tablet (81 mg total) by mouth daily. Swallow whole.   buPROPion (WELLBUTRIN XL) 300 MG 24 hr tablet Take 300 mg by mouth daily.   celecoxib (CELEBREX) 200 MG capsule Take 200 mg by mouth as needed. Takes 1 tablet as needed   rosuvastatin (  CRESTOR) 10 MG tablet TAKE 1 TABLET BY MOUTH EVERY DAY   sertraline (ZOLOFT) 100 MG tablet Take 100 mg by mouth at bedtime.    [DISCONTINUED] carvedilol (COREG) 6.25 MG tablet TAKE 1 TABLET (6.25 MG TOTAL) BY MOUTH 2 (TWO) TIMES DAILY WITH A MEAL.   [DISCONTINUED] cycloSPORINE (RESTASIS) 0.05 % ophthalmic emulsion Place 1 drop into both eyes 2 (two) times daily.   [DISCONTINUED] losartan (COZAAR) 25 MG tablet TAKE 1 TABLET BY  MOUTH EVERY DAY   [DISCONTINUED] nitroGLYCERIN (NITROSTAT) 0.4 MG SL tablet Place 1 tablet (0.4 mg total) under the tongue every 5 (five) minutes x 3 doses as needed for chest pain.     Allergies:   Patient has no known allergies.   Social History   Socioeconomic History   Marital status: Divorced    Spouse name: Not on file   Number of children: Not on file   Years of education: Not on file   Highest education level: Not on file  Occupational History   Not on file  Tobacco Use   Smoking status: Former   Smokeless tobacco: Never   Tobacco comments:    quit in 2017. Smoked for 30 years prior to that.   Vaping Use   Vaping Use: Never used  Substance and Sexual Activity   Alcohol use: No    Alcohol/week: 0.0 standard drinks   Drug use: No   Sexual activity: Not on file  Other Topics Concern   Not on file  Social History Narrative   Not on file   Social Determinants of Health   Financial Resource Strain: Not on file  Food Insecurity: Not on file  Transportation Needs: Not on file  Physical Activity: Not on file  Stress: Not on file  Social Connections: Not on file     Family History: The patient's family history includes Heart disease in her father; Transient ischemic attack in her mother.  ROS:   Please see the history of present illness.    Review of Systems  Respiratory:  Negative for shortness of breath.   Cardiovascular:  Negative for chest pain, palpitations and leg swelling.   All other systems reviewed and are negative.  EKGs/Labs/Other Studies Reviewed:    The following studies were reviewed today:   EKG:  EKG ordered today.  The ekg ordered today demonstrates normal sinus rhythm rate of 71.  Recent Labs: 05/12/2021: ALT 22  Recent Lipid Panel    Component Value Date/Time   CHOL 141 05/12/2021 0858   TRIG 97 05/12/2021 0858   HDL 56 05/12/2021 0858   CHOLHDL 2.5 05/12/2021 0858   CHOLHDL 2.6 04/10/2019 1112   VLDL 20 04/10/2019 1112   LDLCALC  67 05/12/2021 0858     Risk Assessment/Calculations:           Physical Exam:    VS:  BP (!) 164/78    Pulse 71    Ht 5\' 5"  (1.651 m)    Wt 197 lb (89.4 kg)    SpO2 95%    BMI 32.78 kg/m     Wt Readings from Last 3 Encounters:  02/09/22 197 lb (89.4 kg)  08/11/21 197 lb 12.8 oz (89.7 kg)  01/15/21 183 lb 6.4 oz (83.2 kg)     GEN:  Well nourished, well developed in no acute distress HEENT: Normal NECK: No JVD; No carotid bruits LYMPHATICS: No lymphadenopathy CARDIAC: RRR, no murmurs, rubs, gallops, S1 and S2 within normal limits, no S3, S4 RESPIRATORY:  Clear to auscultation without rales, wheezing or rhonchi  ABDOMEN: Soft, non-tender, non-distended MUSCULOSKELETAL:  No edema; No deformity  SKIN: Warm and dry NEUROLOGIC:  Alert and oriented x 3 PSYCHIATRIC:  Normal affect   ASSESSMENT:    1. Coronary artery disease involving native coronary artery of native heart without angina pectoris   2. Carotid artery stenosis, asymptomatic, bilateral   3. Essential hypertension   4. Hyperlipidemia, unspecified hyperlipidemia type    PLAN:    In order of problems listed above:  Coronary artery disease: STEMI 02/09/19 with PCI Prox mLAD                                            Denies CP, SOB, palpitation                                            Continue 81 mg aspirin                                              Low-sodium heart healthy diet                                 Continue exercise at cardiac rehab 3 day/wk                                         Ischemic cardiomyopathy: Echo completed in June 2020 EF: 60-65% normal LV function Continue Coreg 6.25 mg BID and   Losartan 25 mg                                               Hypertension: B/P today: 142/72, well controlled, continue losartan and Coreg 6.2 mg. heart healthy low-sodium diet, and exercise                             Hyperlipidemia: LDL: 67 continue Crestor 10 mg, will check fasting liver function in May  2023  Carotid artery disease: Dopplers completed 01/13/22 revealed stable carotid artery disease.  Continue risk factor modification.                                                                               Disposition: Follow-up in 6 months with Dr. Martinique and call if needed sooner.         Medication Adjustments/Labs and Tests Ordered: Current medicines are reviewed at length with the patient today.  Concerns regarding medicines are outlined above.  Orders Placed This Encounter  Procedures   Basic metabolic  panel   Hepatic function panel   EKG 12-Lead   Meds ordered this encounter  Medications   carvedilol (COREG) 6.25 MG tablet    Sig: Take 1 tablet (6.25 mg total) by mouth 2 (two) times daily with a meal.    Dispense:  180 tablet    Refill:  2   losartan (COZAAR) 25 MG tablet    Sig: Take 1 tablet (25 mg total) by mouth daily.    Dispense:  90 tablet    Refill:  1   nitroGLYCERIN (NITROSTAT) 0.4 MG SL tablet    Sig: Place 1 tablet (0.4 mg total) under the tongue every 5 (five) minutes x 3 doses as needed for chest pain.    Dispense:  25 tablet    Refill:  1    Patient Instructions  Medication Instructions:  The current medical regimen is effective;  continue present plan and medications as directed. Please refer to the Current Medication list given to you today.   *If you need a refill on your cardiac medications before your next appointment, please call your pharmacy*  Lab Work:    BMET TODAY AND LFT IN MAY 2023-THIS IS NOT FASTING  Follow-Up: Your next appointment:  6 month(s) In Person with Peter Martinique, MD   Please call our office 2 months in advance to schedule this appointment  :1  At [facility], you and your health needs are our priority.  As part of our continuing mission to provide you with exceptional heart care, we have created designated Provider Care Teams.  These Care Teams include your primary Cardiologist (physician) and Advanced Practice  Providers (APPs -  Physician Assistants and Nurse Practitioners) who all work together to provide you with the care you need, when you need it.              Signed, Mable Fill, Marissa Nestle, NP  02/09/2022 2:08 PM    [facility]

## 2022-02-09 ENCOUNTER — Other Ambulatory Visit: Payer: Self-pay

## 2022-02-09 ENCOUNTER — Encounter: Payer: Self-pay | Admitting: General Practice

## 2022-02-09 ENCOUNTER — Ambulatory Visit: Payer: Medicare PPO | Admitting: Nurse Practitioner

## 2022-02-09 VITALS — BP 142/72 | HR 71 | Ht 65.0 in | Wt 197.0 lb

## 2022-02-09 DIAGNOSIS — I6523 Occlusion and stenosis of bilateral carotid arteries: Secondary | ICD-10-CM

## 2022-02-09 DIAGNOSIS — I10 Essential (primary) hypertension: Secondary | ICD-10-CM | POA: Diagnosis not present

## 2022-02-09 DIAGNOSIS — E785 Hyperlipidemia, unspecified: Secondary | ICD-10-CM | POA: Diagnosis not present

## 2022-02-09 DIAGNOSIS — I2510 Atherosclerotic heart disease of native coronary artery without angina pectoris: Secondary | ICD-10-CM

## 2022-02-09 MED ORDER — NITROGLYCERIN 0.4 MG SL SUBL: 0.4000 mg | SUBLINGUAL_TABLET | SUBLINGUAL | 1 refills | Status: AC | PRN

## 2022-02-09 MED ORDER — LOSARTAN POTASSIUM 25 MG PO TABS: 25.0000 mg | ORAL_TABLET | Freq: Every day | ORAL | 1 refills | Status: DC

## 2022-02-09 MED ORDER — CARVEDILOL 6.25 MG PO TABS: 6.2500 mg | ORAL_TABLET | Freq: Two times a day (BID) | ORAL | 2 refills | Status: DC

## 2022-02-09 NOTE — Patient Instructions (Signed)
Medication Instructions:  The current medical regimen is effective;  continue present plan and medications as directed. Please refer to the Current Medication list given to you today.   *If you need a refill on your cardiac medications before your next appointment, please call your pharmacy*  Lab Work:    BMET TODAY AND LFT IN MAY 2023-THIS IS NOT FASTING  Follow-Up: Your next appointment:  6 month(s) In Person with Peter Swazil[place], MD   Please call our office 2 months in advance to schedule this appointment  :1  At [facility], you and your health needs are our priority.  As part of our continuing mission to provide you with exceptional heart care, we have created designated Provider Care Teams.  These Care Teams include your primary Cardiologist (physician) and Advanced Practice Providers (APPs -  Physician Assistants and Nurse Practitioners) who all work together to provide you with the care you need, when you need it.

## 2022-02-10 LAB — BASIC METABOLIC PANEL
BUN/Creatinine Ratio: 19 (ref 12–28)
BUN: 16 mg/dL (ref 8–27)
CO2: 23 mmol/L (ref 20–29)
Calcium: 9.6 mg/dL (ref 8.7–10.3)
Chloride: 105 mmol/L (ref 96–106)
Creatinine, Ser: 0.86 mg/dL (ref 0.57–1.00)
Glucose: 84 mg/dL (ref 70–99)
Potassium: 4.1 mmol/L (ref 3.5–5.2)
Sodium: 142 mmol/L (ref 134–144)
eGFR: 71 mL/min/{1.73_m2} (ref >59)

## 2022-04-20 ENCOUNTER — Other Ambulatory Visit (HOSPITAL_COMMUNITY): Payer: Self-pay | Admitting: Family Medicine

## 2022-04-20 DIAGNOSIS — Z1231 Encounter for screening mammogram for malignant neoplasm of breast: Secondary | ICD-10-CM

## 2022-04-23 ENCOUNTER — Ambulatory Visit (HOSPITAL_COMMUNITY)
Admission: RE | Admit: 2022-04-23 | Discharge: 2022-04-23 | Disposition: A | Discharge status: Home / Self Care | Payer: Medicare PPO | Source: Ambulatory Visit | Attending: Family Medicine | Admitting: Family Medicine

## 2022-04-23 DIAGNOSIS — Z1231 Encounter for screening mammogram for malignant neoplasm of breast: Secondary | ICD-10-CM | POA: Diagnosis not present

## 2022-04-30 ENCOUNTER — Ambulatory Visit: Payer: Medicare PPO | Admitting: Podiatry

## 2022-04-30 DIAGNOSIS — M79674 Pain in right toe(s): Secondary | ICD-10-CM | POA: Diagnosis not present

## 2022-04-30 DIAGNOSIS — B351 Tinea unguium: Secondary | ICD-10-CM

## 2022-04-30 DIAGNOSIS — M79675 Pain in left toe(s): Secondary | ICD-10-CM

## 2022-05-02 NOTE — Progress Notes (Signed)
Subjective:  ? ?Patient ID: Rachel Kennedy, female   DOB: 76 y.o.   MRN: [id number]  ? ?HPI ?76 year old female presents the office today for concerns of bilateral hallux nail thickening, ingrowing of the nails.  She previously had the nail corners removed and the nails clean and thickened discolored and other nails are growing to the side.  No swelling or redness or any drainage from the nails to cause discomfort at times.  No open lesions that she reports.  No other concerns. ? ? ?Review of Systems  ?All other systems reviewed and are negative. ? ?Past Medical History:  ?Diagnosis Date  ? Acute systolic (congestive) heart failure (HCC)   ? CAD (coronary artery disease) 05/07/2019  ? Depression 05/07/2019  ? Hyperlipidemia   ? Hypertension   ? STEMI (ST elevation myocardial infarction) (HCC)   ? ? ?Past Surgical History:  ?Procedure Laterality Date  ? CORONARY/GRAFT ACUTE MI REVASCULARIZATION N/A 02/09/2019  ? Procedure: CORONARY/GRAFT ACUTE MI REVASCULARIZATION;  Surgeon: Swaziland, Peter M, MD;  Location: [facility] INVASIVE CV LAB;  Service: Cardiovascular;  Laterality: N/A;  ? FRACTURE SURGERY    ? LEFT HEART CATH AND CORONARY ANGIOGRAPHY N/A 02/09/2019  ? Procedure: LEFT HEART CATH AND CORONARY ANGIOGRAPHY;  Surgeon: Swaziland, Peter M, MD;  Location: [facility] INVASIVE CV LAB;  Service: Cardiovascular;  Laterality: N/A;  ? OVARY SURGERY    ? ? ? ?Current Outpatient Medications:  ?  acetaminophen (TYLENOL) 500 MG tablet, Take 500 mg by mouth every 6 (six) hours as needed for headache (pain)., Disp: , Rfl:  ?  aspirin EC 81 MG tablet, Take 1 tablet (81 mg total) by mouth daily. Swallow whole., Disp: 90 tablet, Rfl: 3 ?  buPROPion (WELLBUTRIN XL) 300 MG 24 hr tablet, Take 300 mg by mouth daily., Disp: , Rfl: 1 ?  carvedilol (COREG) 6.25 MG tablet, Take 1 tablet (6.25 mg total) by mouth 2 (two) times daily with a meal., Disp: 180 tablet, Rfl: 2 ?  celecoxib (CELEBREX) 200 MG capsule, Take 200 mg by mouth as needed. Takes 1 tablet as  needed, Disp: , Rfl:  ?  losartan (COZAAR) 25 MG tablet, Take 1 tablet (25 mg total) by mouth daily., Disp: 90 tablet, Rfl: 1 ?  nitroGLYCERIN (NITROSTAT) 0.4 MG SL tablet, Place 1 tablet (0.4 mg total) under the tongue every 5 (five) minutes x 3 doses as needed for chest pain., Disp: 25 tablet, Rfl: 1 ?  rosuvastatin (CRESTOR) 10 MG tablet, TAKE 1 TABLET BY MOUTH EVERY DAY, Disp: 90 tablet, Rfl: 3 ?  sertraline (ZOLOFT) 100 MG tablet, Take 100 mg by mouth at bedtime. , Disp: , Rfl:  ? ?No Known Allergies ? ? ? ?   ?Objective:  ?Physical Exam  ?General: AAO x3, NAD ? ?Dermatological: Nails are hypertrophic, dystrophic with yellow, brown discoloration.  Most notably the hallux nails are significantly dystrophic and they are ingrown on the nail borders.  No edema, erythema or signs of infection of the toenail sites.  Tenderness nails 1-5 bilaterally.  No open lesions. ? ?Vascular: Dorsalis Pedis artery and Posterior Tibial artery pedal pulses are 2/4 bilateral with immedate capillary fill time. There is no pain with calf compression, swelling, warmth, erythema.  ? ?Neruologic: Grossly intact via light touch bilateral.  ? ?Musculoskeletal: No gross boney pedal deformities bilateral. No pain, crepitus, or limitation noted with foot and ankle range of motion bilateral. Muscular strength 5/5 in all groups tested bilateral. ? ?Gait: Unassisted, Nonantalgic.  ? ? ?   ?  Assessment:  ? ?Symptomatic onychomycosis ? ?   ?Plan:  ?-Treatment options discussed including all alternatives, risks, and complications ?-Etiology of symptoms were discussed ?-Nails debrided ?10 without complications or bleeding.  Discussed treatment options.  Given the dystrophy of the nail discussed toenail removal but ultimately I debrided them today.  However compound cream through [facility] to help with the nail fungus but also the dystrophy. ?-Daily foot inspection ?-Follow-up in 3 months or sooner if any problems arise. In the meantime,  encouraged to call the office with any questions, concerns, change in symptoms.  ? ?Ovid Curd, DPM ? ?   ? ?

## 2022-05-07 DIAGNOSIS — E785 Hyperlipidemia, unspecified: Secondary | ICD-10-CM | POA: Diagnosis not present

## 2022-05-07 DIAGNOSIS — I10 Essential (primary) hypertension: Secondary | ICD-10-CM | POA: Diagnosis not present

## 2022-05-07 DIAGNOSIS — I2510 Atherosclerotic heart disease of native coronary artery without angina pectoris: Secondary | ICD-10-CM | POA: Diagnosis not present

## 2022-05-07 DIAGNOSIS — I6523 Occlusion and stenosis of bilateral carotid arteries: Secondary | ICD-10-CM | POA: Diagnosis not present

## 2022-05-08 LAB — HEPATIC FUNCTION PANEL
ALT: 17 IU/L (ref 0–32)
AST: 19 IU/L (ref 0–40)
Albumin: 4.4 g/dL (ref 3.7–4.7)
Alkaline Phosphatase: 94 IU/L (ref 44–121)
Bilirubin Total: 0.4 mg/dL (ref 0.0–1.2)
Bilirubin, Direct: 0.13 mg/dL (ref 0.00–0.40)
Total Protein: 6.9 g/dL (ref 6.0–8.5)

## 2022-06-07 IMAGING — MG MM DIGITAL SCREENING BILAT W/ TOMO AND CAD
6 of 10 series · 6 of 30 positions shown · non-contrast
Comparison: Previous exam(s).

CLINICAL DATA: Screening.

EXAM:
DIGITAL SCREENING BILATERAL MAMMOGRAM WITH TOMOSYNTHESIS AND CAD
TECHNIQUE: Bilateral screening digital craniocaudal and mediolateral oblique
mammograms were obtained. Bilateral screening digital breast
tomosynthesis was performed. The images were evaluated with
computer-aided detection.

[R MLO synth-2D]
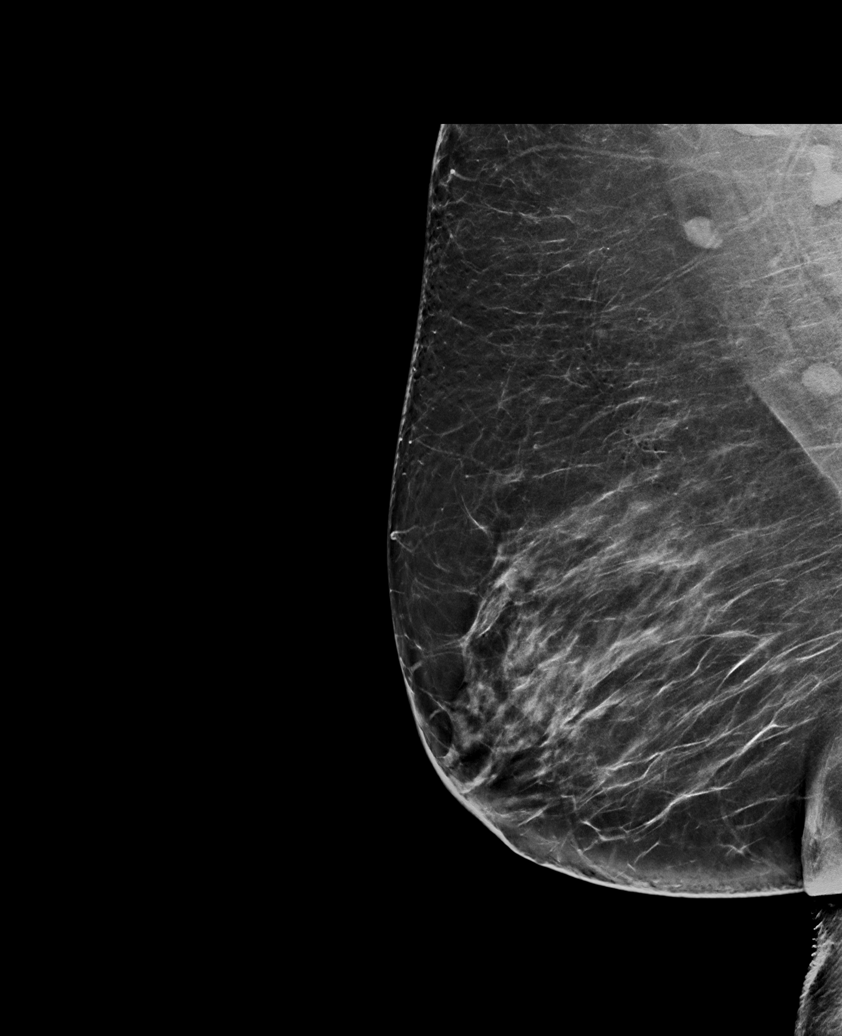

[L CC synth-2D (1 of 2)]
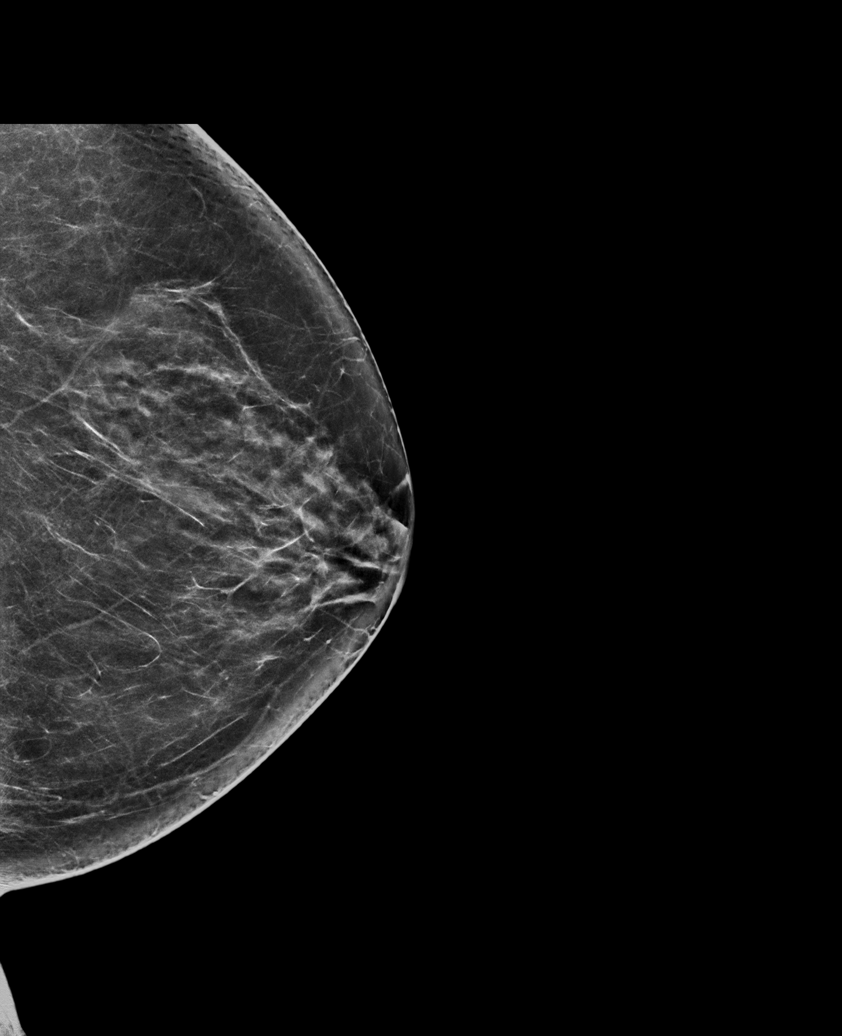

[L CC synth-2D (2 of 2)]
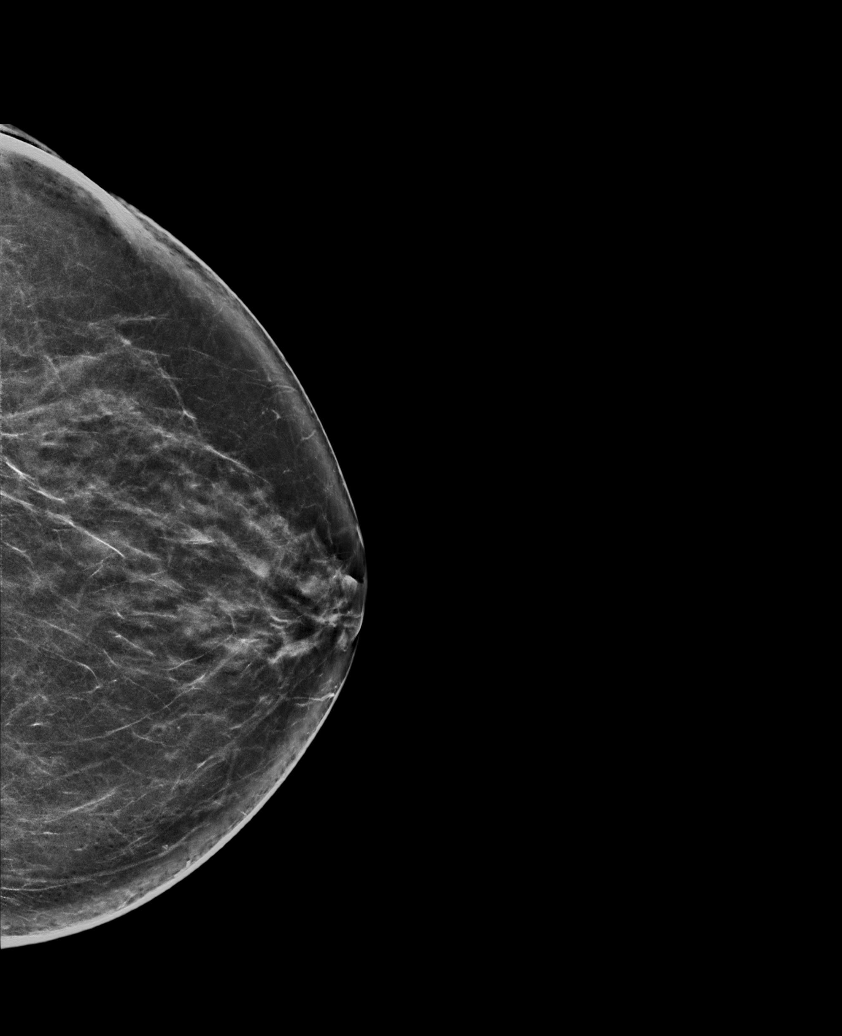

[L MLO synth-2D]
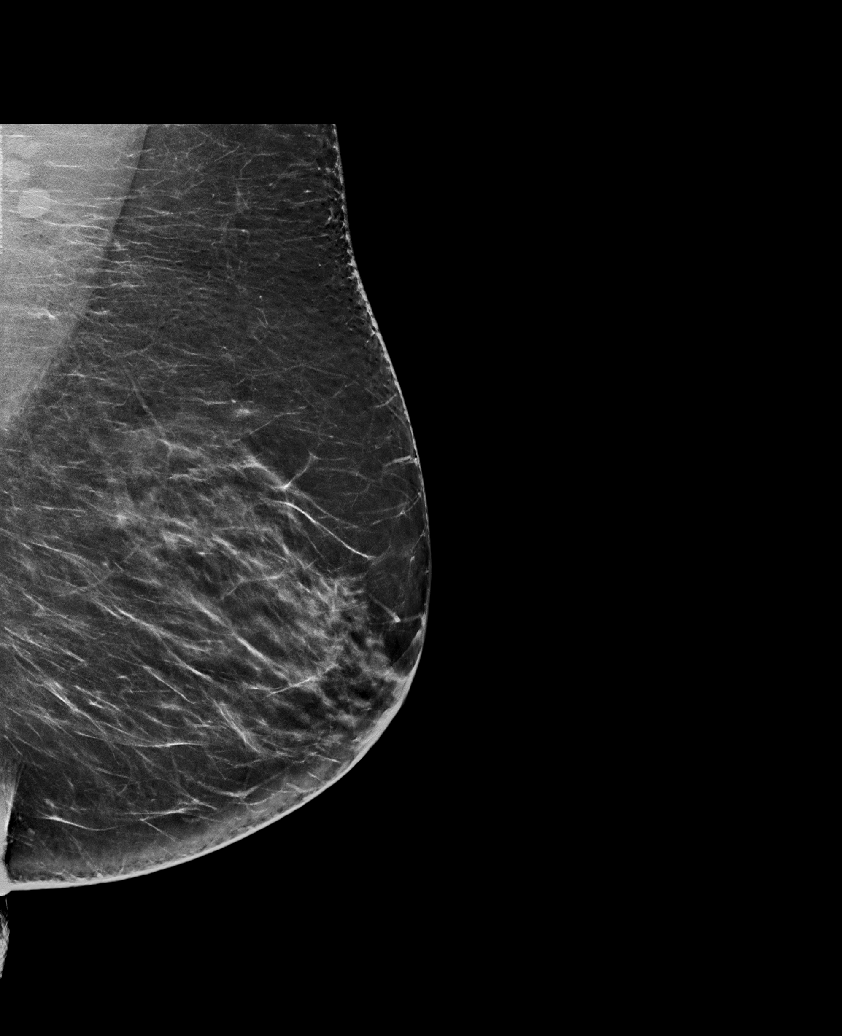

[R CC synth-2D]
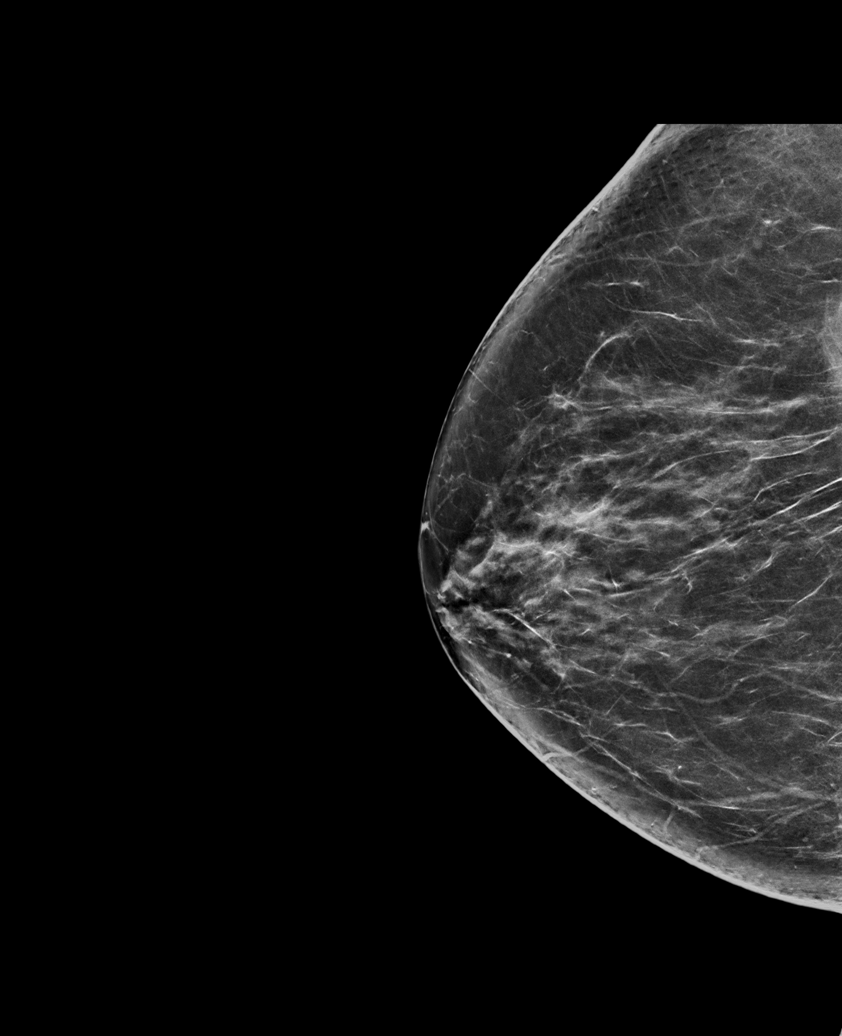

[L CC tomo · tomo slice 39/78.0]
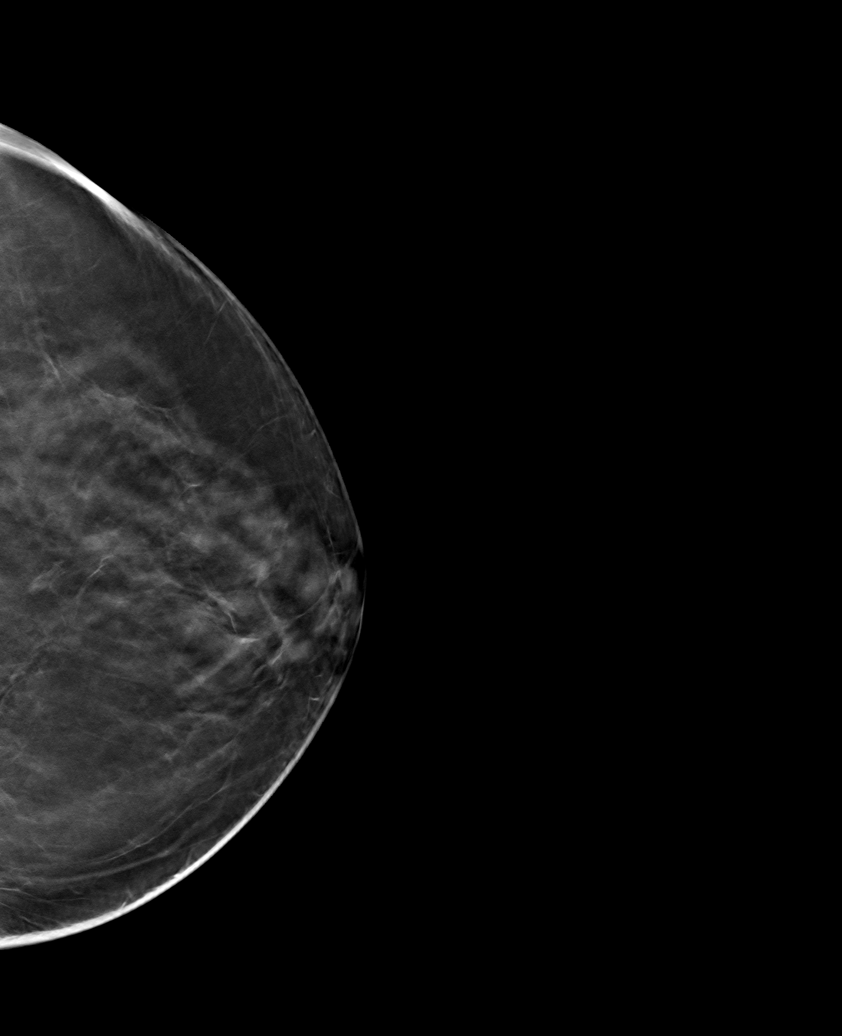

[6 of 30 positions shown; findings below may reference images not displayed]

ACR Breast Density Category c: The breast tissue is heterogeneously
dense, which may obscure small masses.
FINDINGS: There are no findings suspicious for malignancy.
IMPRESSION: No mammographic evidence of malignancy. A result letter of this
screening mammogram will be mailed directly to the patient.

RECOMMENDATION:
Screening mammogram in one year. (Code:[id number])

BI-RADS CATEGORY  1: Negative.

## 2022-07-24 DIAGNOSIS — H2513 Age-related nuclear cataract, bilateral: Secondary | ICD-10-CM | POA: Diagnosis not present

## 2022-07-24 DIAGNOSIS — H401134 Primary open-angle glaucoma, bilateral, indeterminate stage: Secondary | ICD-10-CM | POA: Diagnosis not present

## 2022-08-22 ENCOUNTER — Other Ambulatory Visit: Payer: Self-pay | Admitting: General Practice

## 2022-09-06 NOTE — Progress Notes (Signed)
Date:  09/11/2022   ID:  Rachel Kennedy, Rachel Kennedy 10-Oct-1946, MRN [id number]   PCP:  Sharilyn Sites, MD  Cardiologist:  Kristoffer Bala Martinique, MD  Electrophysiologist:  None   Evaluation Performed:  Follow-Up Visit  Chief Complaint:  Follow up CAD  History of Present Illness:    Rachel Kennedy is a 76 y.o. female with PMH of HTN, HLD, and  CAD.  Patient was admitted to the hospital on 02/09/2019 with code STEMI.  Initial EKG was consistent with anterior STEMI.   Initial troponin was 0.24.  Patient underwent emergent cardiac catheterization on 02/09/2019 which revealed 50% proximal to mid RCA lesion, 50% ostial to proximal left circumflex lesion, 80% mid to distal LAD lesion, 100% ostial to proximal LAD occlusion treated with Onyx resolute 3.0 x 30 mm DES, EF 25 to 35%, trivial 1+ MR.  Her troponin peaked at 57.07 before trending back down.  Echocardiogram obtained on the following day showed EF 35 to 40%, moderate hypokinesis of the LV basal anteroseptal and basal inferoseptal region, mild sclerosis of the aortic valve.  Lipid panel obtained during the hospital showed LDL 126, HDL 37.  Patient was discharged on aspirin and Brilinta along with carvedilol and losartan.  Blood pressure in the hospital was too low for Entresto.  She was admitted overnight on 05/07/19 with an episode of near syncope. She was not orthostatic. Troponin negative. Echo showed normalization of LV function. Carotid dopplers showed moderate bilateral disease with > 70% on the right. CTA revealed 60 % stenosis if left ICA and 50% in right ICA. Head CT was normal. Repeat carotid dopplers in November showed bilateral 60-79% disease. Repeat dopplers in Jan [id number] showed [id number] LICA stenosis. Stable.   On follow up today she reports she has not been exercising. Notes weight gain of 12 lbs. Notes increase SOB with exertion. No chest pain. Also has some urinary incontinence. More arthritic pain. Planning to join a gym.    Past Medical  History:  Diagnosis Date   Acute systolic (congestive) heart failure (HCC)    CAD (coronary artery disease) 05/07/2019   Depression 05/07/2019   Hyperlipidemia    Hypertension    STEMI (ST elevation myocardial infarction) [facility])    Past Surgical History:  Procedure Laterality Date   CORONARY/GRAFT ACUTE MI REVASCULARIZATION N/A 02/09/2019   Procedure: CORONARY/GRAFT ACUTE MI REVASCULARIZATION;  Surgeon: Martinique, Dashley Monts M, MD;  Location: [place] CV LAB;  Service: Cardiovascular;  Laterality: N/A;   FRACTURE SURGERY     LEFT HEART CATH AND CORONARY ANGIOGRAPHY N/A 02/09/2019   Procedure: LEFT HEART CATH AND CORONARY ANGIOGRAPHY;  Surgeon: Martinique, Lenon Kuennen M, MD;  Location: [place] CV LAB;  Service: Cardiovascular;  Laterality: N/A;   OVARY SURGERY       Current Meds  Medication Sig   acetaminophen (TYLENOL) 500 MG tablet Take 500 mg by mouth every 6 (six) hours as needed for headache (pain).   aspirin EC 81 MG tablet Take 1 tablet (81 mg total) by mouth daily. Swallow whole.   carvedilol (COREG) 6.25 MG tablet Take 1 tablet (6.25 mg total) by mouth 2 (two) times daily with a meal.   celecoxib (CELEBREX) 200 MG capsule Take 200 mg by mouth as needed. Takes 1 tablet as needed   losartan (COZAAR) 25 MG tablet TAKE 1 TABLET (25 MG TOTAL) BY MOUTH DAILY.   nitroGLYCERIN (NITROSTAT) 0.4 MG SL tablet Place 1 tablet (0.4 mg total) under the tongue every 5 (five)  minutes x 3 doses as needed for chest pain.   rosuvastatin (CRESTOR) 10 MG tablet TAKE 1 TABLET BY MOUTH EVERY DAY   [DISCONTINUED] buPROPion (WELLBUTRIN XL) 300 MG 24 hr tablet Take 300 mg by mouth daily.   [DISCONTINUED] sertraline (ZOLOFT) 100 MG tablet Take 100 mg by mouth at bedtime.      Allergies:   Patient has no known allergies.   Social History   Tobacco Use   Smoking status: Former   Smokeless tobacco: Never   Tobacco comments:    quit in 2017. Smoked for 30 years prior to that.   Vaping Use   Vaping Use: Never used   Substance Use Topics   Alcohol use: No    Alcohol/week: 0.0 standard drinks of alcohol   Drug use: No     Family Hx: The patient's family history includes Heart disease in her father; Transient ischemic attack in her mother.  ROS:   Please see the history of present illness.    All other systems reviewed and are negative.   Prior CV studies:   The following studies were reviewed today:  Cath 02/09/2019 Prox RCA to Mid RCA lesion is 50% stenosed. Ost Cx to Prox Cx lesion is 30% stenosed. Mid LAD to Dist LAD lesion is 80% stenosed. Ost LAD to Prox LAD lesion is 100% stenosed. Post intervention, there is a 0% residual stenosis. A drug-eluting stent was successfully placed using a STENT RESOLUTE ONYX 3.0X30. There is severe left ventricular systolic dysfunction. LV end diastolic pressure is normal. The left ventricular ejection fraction is 25-35% by visual estimate. There is trivial (1+) mitral regurgitation.   1. 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 Diffuse 50% proximal to mid RCA 3. Severe LV dysfunction with anterolateral akinesis. EF 30-35%. 4. Normal LVEDP 5. Successful PCI with stenting of the proximal LAD with DES. There is persistent diffuse narrowing of the mid to distal LAD of unclear etiology ? Spasm   Plan: DAPT for one year. Aggrastat for 6 hours. Optimize medical therapy with statin, beta blocker, ARB. If EF <35% by Echo consider Entresto. May also need to consider Lifevest at DC.      Echo 02/10/2019  1. The left ventricle has moderately reduced systolic function, with an ejection fraction of 35-40%. The cavity size was normal. Left ventricular diastolic Doppler parameters are consistent with impaired relaxation.  2. Severe hypokinesis of the left ventricular mid anteroseptum and inferoseptum, anteroapex and apical septum.  3. Moderate hypokinesis of the left ventricular basal anteroseptum and basal inferoseptum.  4. The tricuspid valve is normal  in structure.  5. The pulmonic valve was normal in structure.  6. The mitral valve is normal in structure.  7. The aortic valve is normal in structure. Mild sclerosis of the aortic valve.   SUMMARY   Endocardial border not optimally visualized for wall motion abnormalities, future exams should be performed with Definity IV LV Enhancement  Echo 05/07/19: IMPRESSIONS      1. The left ventricle has normal systolic function with an ejection fraction of 55-60%. The cavity size was normal. There is moderate concentric left ventricular hypertrophy. Left ventricular diastolic Doppler parameters are consistent with impaired  relaxation. There is hypokinesis of the mid anteroseptal and apical anterior walls.  2. The right ventricle has normal systolic function. The cavity was normal. There is no increase in right ventricular wall thickness.  3. Left atrial size was mildly dilated.    Carotid dopplers 05/07/19:  CLINICAL DATA:  Syncopal episode earlier today. History of CAD, hypertension and hyperlipidemia.   EXAM: BILATERAL CAROTID DUPLEX ULTRASOUND   TECHNIQUE: Pearline Cables scale imaging, color Doppler and duplex ultrasound were performed of bilateral carotid and vertebral arteries in the neck.   COMPARISON:  None.   FINDINGS: Criteria: Quantification of carotid stenosis is based on velocity parameters that correlate the residual internal carotid diameter with NASCET-based stenosis levels, using the diameter of the distal internal carotid lumen as the denominator for stenosis measurement.   The following velocity measurements were obtained:   RIGHT   ICA: 177/28 cm/sec   CCA: 123456 cm/sec   SYSTOLIC ICA/CCA RATIO:  1.5   ECA: 134 cm/sec   LEFT   ICA: 300/64 cm/sec   CCA: 99991111 cm/sec   SYSTOLIC ICA/CCA RATIO:  2.6   ECA: 141 cm/sec   RIGHT CAROTID ARTERY: There is a moderate amount of circumferential mixed echogenic plaque within the right carotid bulb (images 14  and 16), extending to involve the origin and proximal aspects of the right internal carotid artery (is 24, which results in elevated peak systolic velocities throughout the interrogated course the right internal carotid artery. Greatest acquired peak systolic velocity within the mid aspect the right internal carotid artery measures 177 centimeters/second (image 29).   RIGHT VERTEBRAL ARTERY:  Antegrade Flow   LEFT CAROTID ARTERY: Circumferential intimal thickening/hypoechoic plaque throughout the left common carotid artery (image 41, 44 and 45). There is a moderate to large amount of eccentric mixed echogenic plaque within the left carotid bulb (image 50), extending to involve the origin and proximal aspect the left internal carotid artery (image 58, which results in elevated peak systolic velocities within the proximal mid aspects of the left internal carotid artery. Greatest acquired peak systolic velocity within the proximal left ICA measures 300 centimeters/second (image 60).   LEFT VERTEBRAL ARTERY:  Antegrade Flow   IMPRESSION: Moderate to large amount of bilateral atherosclerotic plaque, right greater than left, results in elevated peak systolic velocities within the bilateral internal carotid arteries, the right >70%, and the left compatible with the 50-69% luminal narrowing range.   Further evaluation with CTA could be performed as clinically indicated.     Electronically Signed   By: Sandi Mariscal M.D.   On: 05/07/2019 11:01  Labs/Other Tests and Data Reviewed:    EKG:   is not done today  Recent Labs: 02/09/2022: BUN 16; Creatinine, Ser 0.86; Potassium 4.1; Sodium 142 05/07/2022: ALT 17   Recent Lipid Panel Lab Results  Component Value Date/Time   CHOL 141 05/12/2021 08:58 AM   TRIG 97 05/12/2021 08:58 AM   HDL 56 05/12/2021 08:58 AM   CHOLHDL 2.5 05/12/2021 08:58 AM   CHOLHDL 2.6 04/10/2019 11:12 AM   LDLCALC 67 05/12/2021 08:58 AM   Dated 05/10/20: A1c  5.9%. cholesterol 121, triglycerides 107, HDL 50, LDL 51. CBC, TSH normal. AST 55, ALT 54.    Wt Readings from Last 3 Encounters:  09/11/22 209 lb 12.8 oz (95.2 kg)  02/09/22 197 lb (89.4 kg)  08/11/21 197 lb 12.8 oz (89.7 kg)     Objective:    Vital Signs:  BP (!) 140/60   Pulse 66   Ht 5\' 6"  (1.676 m)   Wt 209 lb 12.8 oz (95.2 kg)   SpO2 95%   BMI 33.86 kg/m    GENERAL:  Well appearing WF in NAD HEENT:  PERRL, EOMI, sclera are clear. Oropharynx is clear. NECK:  No jugular venous distention,  carotid upstroke brisk and symmetric, no bruits, no thyromegaly or adenopathy LUNGS:  Clear to auscultation bilaterally CHEST:  Unremarkable HEART:  RRR,  PMI not displaced or sustained,S1 and S2 within normal limits, no S3, no S4: no clicks, no rubs, no murmurs ABD:  Soft, nontender. BS +, no masses or bruits. No hepatomegaly, no splenomegaly EXT:  2 + pulses throughout, trace ankle edema, no cyanosis no clubbing SKIN:  Warm and dry.  No rashes NEURO:  Alert and oriented x 3. Cranial nerves II through XII intact. PSYCH:  Cognitively intact    ASSESSMENT & PLAN:    CAD: Anterior STEMI with DES to ostial LAD on February 09, 2019.  Residual 80% mid to distal LAD lesion that will be treated medically. No angina. On ASA only now. Continue beta blocker and statin    Ischemic cardiomyopathy: Echo showed normalization of LV function in May 2020. Continue carvedilol and losartan. She is complaining of some increased SOB likely related to inactivity and weight gain. Will update Echo.     Hypertension: Blood pressure is mildly elevated today but readings at home are OK   Hyperlipidemia: now on Crestor. Will update fasting lab.   5. Carotid arterial disease- moderate bilateral. Continue risk factor modification. Last dopplers unchanged. Follow yearly  6.  Urinary incontinence - check UA.   7. Obesity. Discussed importance of regular aerobic exercise. Needs to follow low carb diet.     Medication Changes: No orders of the defined types were placed in this encounter.    Follow Up:  In Person in 6 month(s)  Signed, Blimie Vaness Martinique, MD  09/11/2022 8:42 AM    [facility]

## 2022-09-11 ENCOUNTER — Encounter: Payer: Self-pay | Admitting: Cardiology

## 2022-09-11 ENCOUNTER — Ambulatory Visit: Payer: Medicare PPO | Attending: Cardiology | Admitting: Cardiology

## 2022-09-11 VITALS — BP 140/60 | HR 66 | Ht 66.0 in | Wt 209.8 lb

## 2022-09-11 DIAGNOSIS — I2510 Atherosclerotic heart disease of native coronary artery without angina pectoris: Secondary | ICD-10-CM | POA: Diagnosis not present

## 2022-09-11 DIAGNOSIS — R0609 Other forms of dyspnea: Secondary | ICD-10-CM | POA: Diagnosis not present

## 2022-09-11 DIAGNOSIS — E785 Hyperlipidemia, unspecified: Secondary | ICD-10-CM | POA: Diagnosis not present

## 2022-09-11 DIAGNOSIS — I6523 Occlusion and stenosis of bilateral carotid arteries: Secondary | ICD-10-CM | POA: Diagnosis not present

## 2022-09-11 DIAGNOSIS — I10 Essential (primary) hypertension: Secondary | ICD-10-CM

## 2022-09-11 NOTE — Patient Instructions (Signed)
Medication Instructions:  Continue same medications *If you need a refill on your cardiac medications before your next appointment, please call your pharmacy*   Lab Work: Bmet,lipid and hepatic panels,cbc,urinalysis    Testing/Procedures: Echo  Carotid dopplers in 12/2022  Follow-Up: At [facility], you and your health needs are our priority.  As part of our continuing mission to provide you with exceptional heart care, we have created designated Provider Care Teams.  These Care Teams include your primary Cardiologist (physician) and Advanced Practice Providers (APPs -  Physician Assistants and Nurse Practitioners) who all work together to provide you with the care you need, when you need it.  We recommend signing up for the patient portal called "MyChart".  Sign up information is provided on this After Visit Summary.  MyChart is used to connect with patients for Virtual Visits (Telemedicine).  Patients are able to view lab/test results, encounter notes, upcoming appointments, etc.  Non-urgent messages can be sent to your provider as well.   To learn more about what you can do with MyChart, go to [email or web address].    Your next appointment:  6 months   Call in Nov to schedule March appointment     The format for your next appointment: Office   Provider:  Dr.Jordan   Important Information About Sugar

## 2022-09-14 ENCOUNTER — Ambulatory Visit (HOSPITAL_COMMUNITY)
Admission: RE | Admit: 2022-09-14 | Discharge: 2022-09-14 | Disposition: A | Discharge status: Home / Self Care | Payer: Medicare PPO | Source: Ambulatory Visit | Attending: Cardiology | Admitting: Cardiology

## 2022-09-14 DIAGNOSIS — R0609 Other forms of dyspnea: Secondary | ICD-10-CM

## 2022-09-14 DIAGNOSIS — I2510 Atherosclerotic heart disease of native coronary artery without angina pectoris: Secondary | ICD-10-CM

## 2022-09-14 DIAGNOSIS — I10 Essential (primary) hypertension: Secondary | ICD-10-CM | POA: Diagnosis not present

## 2022-09-14 DIAGNOSIS — I6523 Occlusion and stenosis of bilateral carotid arteries: Secondary | ICD-10-CM

## 2022-09-14 DIAGNOSIS — E785 Hyperlipidemia, unspecified: Secondary | ICD-10-CM

## 2022-09-14 LAB — ECHOCARDIOGRAM COMPLETE
Area-P 1/2: 2.56 cm2
S' Lateral: 2.7 cm

## 2022-09-14 NOTE — Progress Notes (Signed)
*  PRELIMINARY RESULTS* Echocardiogram 2D Echocardiogram has been performed.  Rachel Kennedy 09/14/2022, 10:29 AM

## 2022-10-16 DIAGNOSIS — I2510 Atherosclerotic heart disease of native coronary artery without angina pectoris: Secondary | ICD-10-CM | POA: Diagnosis not present

## 2022-10-16 DIAGNOSIS — R0609 Other forms of dyspnea: Secondary | ICD-10-CM | POA: Diagnosis not present

## 2022-10-16 DIAGNOSIS — E785 Hyperlipidemia, unspecified: Secondary | ICD-10-CM | POA: Diagnosis not present

## 2022-10-16 DIAGNOSIS — I10 Essential (primary) hypertension: Secondary | ICD-10-CM | POA: Diagnosis not present

## 2022-10-16 DIAGNOSIS — I6523 Occlusion and stenosis of bilateral carotid arteries: Secondary | ICD-10-CM | POA: Diagnosis not present

## 2022-10-17 LAB — BASIC METABOLIC PANEL
BUN/Creatinine Ratio: 15 (ref 12–28)
BUN: 11 mg/dL (ref 8–27)
CO2: 24 mmol/L (ref 20–29)
Calcium: 9.6 mg/dL (ref 8.7–10.3)
Chloride: 101 mmol/L (ref 96–106)
Creatinine, Ser: 0.71 mg/dL (ref 0.57–1.00)
Glucose: 91 mg/dL (ref 70–99)
Potassium: 4.2 mmol/L (ref 3.5–5.2)
Sodium: 140 mmol/L (ref 134–144)
eGFR: 89 mL/min/{1.73_m2} (ref >59)

## 2022-10-17 LAB — HEPATIC FUNCTION PANEL
ALT: 19 IU/L (ref 0–32)
AST: 19 IU/L (ref 0–40)
Albumin: 4.9 g/dL — ABNORMAL HIGH (ref 3.8–4.8)
Alkaline Phosphatase: 87 IU/L (ref 44–121)
Bilirubin Total: 0.5 mg/dL (ref 0.0–1.2)
Bilirubin, Direct: 0.15 mg/dL (ref 0.00–0.40)
Total Protein: 7.2 g/dL (ref 6.0–8.5)

## 2022-10-17 LAB — LIPID PANEL
Chol/HDL Ratio: 2.5 ratio (ref 0.0–4.4)
Cholesterol, Total: 138 mg/dL (ref 100–199)
HDL: 56 mg/dL (ref >39)
LDL Chol Calc (NIH): 60 mg/dL (ref 0–99)
Triglycerides: 123 mg/dL (ref 0–149)
VLDL Cholesterol Cal: 22 mg/dL (ref 5–40)

## 2022-10-17 LAB — URINALYSIS
Bilirubin, UA: NEGATIVE
Glucose, UA: NEGATIVE
Ketones, UA: NEGATIVE
Nitrite, UA: NEGATIVE
Protein,UA: NEGATIVE
RBC, UA: NEGATIVE
Specific Gravity, UA: 1.007 (ref 1.005–1.030)
Urobilinogen, Ur: 0.2 mg/dL (ref 0.2–1.0)
pH, UA: 6.5 (ref 5.0–7.5)

## 2022-10-17 LAB — CBC WITH DIFFERENTIAL/PLATELET
Basophils Absolute: 0.1 10*3/uL (ref 0.0–0.2)
Basos: 1 %
EOS (ABSOLUTE): 0 10*3/uL (ref 0.0–0.4)
Eos: 0 %
Hematocrit: 42.9 % (ref 34.0–46.6)
Hemoglobin: 14.2 g/dL (ref 11.1–15.9)
Immature Grans (Abs): 0 10*3/uL (ref 0.0–0.1)
Immature Granulocytes: 0 %
Lymphocytes Absolute: 2.7 10*3/uL (ref 0.7–3.1)
Lymphs: 36 %
MCH: 27.8 pg (ref 26.6–33.0)
MCHC: 33.1 g/dL (ref 31.5–35.7)
MCV: 84 fL (ref 79–97)
Monocytes Absolute: 0.5 10*3/uL (ref 0.1–0.9)
Monocytes: 7 %
Neutrophils Absolute: 4.2 10*3/uL (ref 1.4–7.0)
Neutrophils: 56 %
Platelets: 179 10*3/uL (ref 150–450)
RBC: 5.1 x10E6/uL (ref 3.77–5.28)
RDW: 13 % (ref 11.7–15.4)
WBC: 7.5 10*3/uL (ref 3.4–10.8)

## 2022-10-28 DIAGNOSIS — E782 Mixed hyperlipidemia: Secondary | ICD-10-CM | POA: Diagnosis not present

## 2022-10-28 DIAGNOSIS — Z0001 Encounter for general adult medical examination with abnormal findings: Secondary | ICD-10-CM | POA: Diagnosis not present

## 2022-10-28 DIAGNOSIS — Z6833 Body mass index (BMI) 33.0-33.9, adult: Secondary | ICD-10-CM | POA: Diagnosis not present

## 2022-10-28 DIAGNOSIS — Z1331 Encounter for screening for depression: Secondary | ICD-10-CM | POA: Diagnosis not present

## 2022-10-28 DIAGNOSIS — M1991 Primary osteoarthritis, unspecified site: Secondary | ICD-10-CM | POA: Diagnosis not present

## 2022-10-28 DIAGNOSIS — I2510 Atherosclerotic heart disease of native coronary artery without angina pectoris: Secondary | ICD-10-CM | POA: Diagnosis not present

## 2022-10-28 DIAGNOSIS — E119 Type 2 diabetes mellitus without complications: Secondary | ICD-10-CM | POA: Diagnosis not present

## 2022-10-28 DIAGNOSIS — E6609 Other obesity due to excess calories: Secondary | ICD-10-CM | POA: Diagnosis not present

## 2022-11-04 ENCOUNTER — Other Ambulatory Visit: Payer: Self-pay | Admitting: General Practice

## 2023-01-11 ENCOUNTER — Ambulatory Visit (HOSPITAL_COMMUNITY)
Admission: RE | Admit: 2023-01-11 | Discharge: 2023-01-11 | Disposition: A | Discharge status: Home / Self Care | Payer: Medicare PPO | Source: Ambulatory Visit | Attending: Internal Medicine | Admitting: Internal Medicine

## 2023-01-11 DIAGNOSIS — E785 Hyperlipidemia, unspecified: Secondary | ICD-10-CM | POA: Insufficient documentation

## 2023-01-11 DIAGNOSIS — I2510 Atherosclerotic heart disease of native coronary artery without angina pectoris: Secondary | ICD-10-CM | POA: Diagnosis not present

## 2023-01-11 DIAGNOSIS — I10 Essential (primary) hypertension: Secondary | ICD-10-CM | POA: Diagnosis not present

## 2023-01-11 DIAGNOSIS — R0609 Other forms of dyspnea: Secondary | ICD-10-CM | POA: Insufficient documentation

## 2023-01-11 DIAGNOSIS — I6523 Occlusion and stenosis of bilateral carotid arteries: Secondary | ICD-10-CM | POA: Diagnosis not present

## 2023-02-11 ENCOUNTER — Telehealth: Payer: Self-pay | Admitting: *Deleted

## 2023-02-11 NOTE — Progress Notes (Signed)
  Care Coordination  Outreach Note  02/11/2023 Name: Rachel Kennedy MRN: [id number] DOB: May 01, 1946   Care Coordination Outreach Attempts: An unsuccessful telephone outreach was attempted today to offer the patient information about available care coordination services as a benefit of their health plan.   Follow Up Plan:  Additional outreach attempts will be made to offer the patient care coordination information and services.   Encounter Outcome:  No Answer  [place]  Direct Dial: [phone]

## 2023-02-16 NOTE — Progress Notes (Signed)
  Care Coordination   Note   02/16/2023 Name: NETRA VESSELS MRN: [id number] DOB: Mar 07, 1946  DIAVION PINDER is a 76 y.o. year old female who sees Sharilyn Sites, MD for primary care. I reached out to Efrain Sella by phone today to offer care coordination services.  Ms. Harewood was given information about Care Coordination services today including:   The Care Coordination services include support from the care team which includes your Nurse Coordinator, Clinical Social Worker, or Pharmacist.  The Care Coordination team is here to help remove barriers to the health concerns and goals most important to you. Care Coordination services are voluntary, and the patient may decline or stop services at any time by request to their care team member.   Care Coordination Consent Status: Patient did not agree to participate in care coordination services at this time.  Encounter Outcome:  Pt. Refused she does not want to call back.   [place]  Direct Dial: [phone]

## 2023-03-13 NOTE — Progress Notes (Unsigned)
Date:  03/17/2023   ID:  Epiphany, Bockrath January 13, 1946, MRN [id number]   PCP:  Sharilyn Sites, MD  Cardiologist:  Cantrell Martus Martinique, MD  Electrophysiologist:  None   Evaluation Performed:  Follow-Up Visit  Chief Complaint:  Follow up CAD  History of Present Illness:    Rachel Kennedy is a 77 y.o. female with PMH of HTN, HLD, and  CAD.  Patient was admitted to the hospital on 02/09/2019 with code STEMI.  Initial EKG was consistent with anterior STEMI.   Initial troponin was 0.24.  Patient underwent emergent cardiac catheterization on 02/09/2019 which revealed 50% proximal to mid RCA lesion, 50% ostial to proximal left circumflex lesion, 80% mid to distal LAD lesion, 100% ostial to proximal LAD occlusion treated with Onyx resolute 3.0 x 30 mm DES, EF 25 to 35%, trivial 1+ MR.  Her troponin peaked at 57.07 before trending back down.  Echocardiogram obtained on the following day showed EF 35 to 40%, moderate hypokinesis of the LV basal anteroseptal and basal inferoseptal region, mild sclerosis of the aortic valve.  Lipid panel obtained during the hospital showed LDL 126, HDL 37.  Patient was discharged on aspirin and Brilinta along with carvedilol and losartan.  Blood pressure in the hospital was too low for Entresto.  She was admitted overnight on 05/07/19 with an episode of near syncope. She was not orthostatic. Troponin negative. Echo showed normalization of LV function. Carotid dopplers showed moderate bilateral disease with > 70% on the right. CTA revealed 60 % stenosis if left ICA and 50% in right ICA. Head CT was normal. Repeat carotid dopplers in November showed bilateral 60-79% disease. Repeat dopplers in Jan [id number] showed [id number] LICA stenosis. Stable.   On follow up today she is doing well. She is participating in the Cardiac Rehab maintenance program.  She has lost 4 lbs.  Notes some SOB that is stable. Echo in September showed good LV function. Carotid dopplers in Jan was stable.  No chest  pain. Does complain of  urinary incontinence. Has arthritic pain in her knees.   Past Medical History:  Diagnosis Date   Acute systolic (congestive) heart failure (HCC)    CAD (coronary artery disease) 05/07/2019   Depression 05/07/2019   Hyperlipidemia    Hypertension    STEMI (ST elevation myocardial infarction) [facility])    Past Surgical History:  Procedure Laterality Date   CORONARY/GRAFT ACUTE MI REVASCULARIZATION N/A 02/09/2019   Procedure: CORONARY/GRAFT ACUTE MI REVASCULARIZATION;  Surgeon: Martinique, Keerthi Hazell M, MD;  Location: [place] [facility];  Service: Cardiovascular;  Laterality: N/A;   FRACTURE SURGERY     LEFT HEART CATH AND CORONARY ANGIOGRAPHY N/A 02/09/2019   Procedure: LEFT HEART CATH AND CORONARY ANGIOGRAPHY;  Surgeon: Martinique, Anelly Samarin M, MD;  Location: [place] [facility];  Service: Cardiovascular;  Laterality: N/A;   OVARY SURGERY       Current Meds  Medication Sig   acetaminophen (TYLENOL) 500 MG tablet Take 500 mg by mouth every 6 (six) hours as needed for headache (pain).   aspirin EC 81 MG tablet Take 1 tablet (81 mg total) by mouth daily. Swallow whole.   carvedilol (COREG) 6.25 MG tablet TAKE 1 TABLET BY MOUTH 2 TIMES DAILY WITH A MEAL.   losartan (COZAAR) 25 MG tablet TAKE 1 TABLET (25 MG TOTAL) BY MOUTH DAILY.   nitroGLYCERIN (NITROSTAT) 0.4 MG SL tablet Place 1 tablet (0.4 mg total) under the tongue every 5 (five) minutes x 3  doses as needed for chest pain.   rosuvastatin (CRESTOR) 10 MG tablet TAKE 1 TABLET BY MOUTH EVERY DAY     Allergies:   Patient has no known allergies.   Social History   Tobacco Use   Smoking status: Former   Smokeless tobacco: Never   Tobacco comments:    quit in 2017. Smoked for 30 years prior to that.   Vaping Use   Vaping Use: Never used  Substance Use Topics   Alcohol use: No    Alcohol/week: 0.0 standard drinks of alcohol   Drug use: No     Family Hx: The patient's family history includes Heart disease in her father;  Transient ischemic attack in her mother.  ROS:   Please see the history of present illness.    All other systems reviewed and are negative.   Prior CV studies:   The following studies were reviewed today:  Cath 02/09/2019 Prox RCA to Mid RCA lesion is 50% stenosed. Ost Cx to Prox Cx lesion is 30% stenosed. Mid LAD to Dist LAD lesion is 80% stenosed. Ost LAD to Prox LAD lesion is 100% stenosed. Post intervention, there is a 0% residual stenosis. A drug-eluting stent was successfully placed using a STENT RESOLUTE ONYX 3.0X30. There is severe left ventricular systolic dysfunction. LV end diastolic pressure is normal. The left ventricular ejection fraction is 25-35% by visual estimate. There is trivial (1+) mitral regurgitation.   1. 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 Diffuse 50% proximal to mid RCA 3. Severe LV dysfunction with anterolateral akinesis. EF 30-35%. 4. Normal LVEDP 5. Successful PCI with stenting of the proximal LAD with DES. There is persistent diffuse narrowing of the mid to distal LAD of unclear etiology ? Spasm   Plan: DAPT for one year. Aggrastat for 6 hours. Optimize medical therapy with statin, beta blocker, ARB. If EF <35% by Echo consider Entresto. May also need to consider Lifevest at DC.      Echo 02/10/2019  1. The left ventricle has moderately reduced systolic function, with an ejection fraction of 35-40%. The cavity size was normal. Left ventricular diastolic Doppler parameters are consistent with impaired relaxation.  2. Severe hypokinesis of the left ventricular mid anteroseptum and inferoseptum, anteroapex and apical septum.  3. Moderate hypokinesis of the left ventricular basal anteroseptum and basal inferoseptum.  4. The tricuspid valve is normal in structure.  5. The pulmonic valve was normal in structure.  6. The mitral valve is normal in structure.  7. The aortic valve is normal in structure. Mild sclerosis of the aortic valve.    SUMMARY   Endocardial border not optimally visualized for wall motion abnormalities, future exams should be performed with Definity IV LV Enhancement  Echo 05/07/19: IMPRESSIONS      1. The left ventricle has normal systolic function with an ejection fraction of 55-60%. The cavity size was normal. There is moderate concentric left ventricular hypertrophy. Left ventricular diastolic Doppler parameters are consistent with impaired  relaxation. There is hypokinesis of the mid anteroseptal and apical anterior walls.  2. The right ventricle has normal systolic function. The cavity was normal. There is no increase in right ventricular wall thickness.  3. Left atrial size was mildly dilated.    Carotid dopplers 05/07/19:  CLINICAL DATA:  Syncopal episode earlier today. History of CAD, hypertension and hyperlipidemia.   EXAM: BILATERAL CAROTID DUPLEX ULTRASOUND   TECHNIQUE: Pearline Cables scale imaging, color Doppler and duplex ultrasound were performed of bilateral carotid  and vertebral arteries in the neck.   COMPARISON:  None.   FINDINGS: Criteria: Quantification of carotid stenosis is based on velocity parameters that correlate the residual internal carotid diameter with NASCET-based stenosis levels, using the diameter of the distal internal carotid lumen as the denominator for stenosis measurement.   The following velocity measurements were obtained:   RIGHT   ICA: 177/28 cm/sec   CCA: 123456 cm/sec   SYSTOLIC ICA/CCA RATIO:  1.5   ECA: 134 cm/sec   LEFT   ICA: 300/64 cm/sec   CCA: 99991111 cm/sec   SYSTOLIC ICA/CCA RATIO:  2.6   ECA: 141 cm/sec   RIGHT CAROTID ARTERY: There is a moderate amount of circumferential mixed echogenic plaque within the right carotid bulb (images 14 and 16), extending to involve the origin and proximal aspects of the right internal carotid artery (is 24, which results in elevated peak systolic velocities throughout the interrogated course the  right internal carotid artery. Greatest acquired peak systolic velocity within the mid aspect the right internal carotid artery measures 177 centimeters/second (image 29).   RIGHT VERTEBRAL ARTERY:  Antegrade Flow   LEFT CAROTID ARTERY: Circumferential intimal thickening/hypoechoic plaque throughout the left common carotid artery (image 41, 44 and 45). There is a moderate to large amount of eccentric mixed echogenic plaque within the left carotid bulb (image 50), extending to involve the origin and proximal aspect the left internal carotid artery (image 58, which results in elevated peak systolic velocities within the proximal mid aspects of the left internal carotid artery. Greatest acquired peak systolic velocity within the proximal left ICA measures 300 centimeters/second (image 60).   LEFT VERTEBRAL ARTERY:  Antegrade Flow   IMPRESSION: Moderate to large amount of bilateral atherosclerotic plaque, right greater than left, results in elevated peak systolic velocities within the bilateral internal carotid arteries, the right >70%, and the left compatible with the 50-69% luminal narrowing range.   Further evaluation with CTA could be performed as clinically indicated.     Electronically Signed   By: Sandi Mariscal M.D.   On: 05/07/2019 11:01  Echo 09/14/22: IMPRESSIONS     1. Left ventricular ejection fraction, by estimation, is 55 to 60%. The  left ventricle has normal function. The left ventricle demonstrates  regional wall motion abnormalities (see scoring diagram/findings for  description). There is moderate asymmetric  left ventricular hypertrophy of the basal segment. Left ventricular  diastolic parameters are consistent with Grade I diastolic dysfunction  (impaired relaxation).   2. Right ventricular systolic function is normal. The right ventricular  size is normal. Tricuspid regurgitation signal is inadequate for assessing  PA pressure.   3. Left atrial size was  upper normal.   4. The mitral valve is grossly normal. Trivial mitral valve  regurgitation.   5. The aortic valve is tricuspid. Aortic valve regurgitation is not  visualized. Aortic valve sclerosis is present, with no evidence of aortic  valve stenosis.   6. The inferior vena cava is normal in size with greater than 50%  respiratory variability, suggesting right atrial pressure of 3 mmHg.   Comparison(s): Prior images reviewed side by side. LVEF overall normal  range at 55-60%. Wall motion abnormalities more evidence on the current  study, previous images were less optimal.   Carotid dopplers 01/11/23: Summary:  Right Carotid: Velocities in the right ICA are consistent with a 1-39%  stenosis.   Left Carotid: Velocities in the left ICA are consistent with a 60-79%  stenosis.  Non-hemodynamically significant plaque <50% noted in the  CCA.   Vertebrals: Bilateral vertebral arteries demonstrate antegrade flow.  Subclavians: Normal flow hemodynamics were seen in bilateral subclavian               arteries.   *See table(s) above for measurements and observations.  Suggest follow up study in 12 months.    Electronically signed by Kathlyn Sacramento MD on 01/13/2023 at 8:17:21 AM   Labs/Other Tests and Data Reviewed:    EKG:   is done today. Sinus brady 59. Left posterior fascicular block. Otherwise normal. I have personally reviewed and interpreted this study.   Recent Labs: 10/16/2022: ALT 19; BUN 11; Creatinine, Ser 0.71; Hemoglobin 14.2; Platelets 179; Potassium 4.2; Sodium 140   Recent Lipid Panel Lab Results  Component Value Date/Time   CHOL 138 10/16/2022 11:35 AM   TRIG 123 10/16/2022 11:35 AM   HDL 56 10/16/2022 11:35 AM   CHOLHDL 2.5 10/16/2022 11:35 AM   CHOLHDL 2.6 04/10/2019 11:12 AM   LDLCALC 60 10/16/2022 11:35 AM   Dated 05/10/20: A1c 5.9%. cholesterol 121, triglycerides 107, HDL 50, LDL 51. CBC, TSH normal. AST 55, ALT 54.    Wt Readings from Last 3  Encounters:  03/17/23 205 lb 3.2 oz (93.1 kg)  09/11/22 209 lb 12.8 oz (95.2 kg)  02/09/22 197 lb (89.4 kg)     Objective:    Vital Signs:  BP 132/74   Pulse (!) 59   Ht 5\' 6"  (1.676 m)   Wt 205 lb 3.2 oz (93.1 kg)   SpO2 95%   BMI 33.12 kg/m    GENERAL:  Well appearing WF in NAD HEENT:  PERRL, EOMI, sclera are clear. Oropharynx is clear. NECK:  No jugular venous distention, carotid upstroke brisk and symmetric, no bruits, no thyromegaly or adenopathy LUNGS:  Clear to auscultation bilaterally CHEST:  Unremarkable HEART:  RRR,  PMI not displaced or sustained,S1 and S2 within normal limits, no S3, no S4: no clicks, no rubs, no murmurs ABD:  Soft, nontender. BS +, no masses or bruits. No hepatomegaly, no splenomegaly EXT:  2 + pulses throughout, trace ankle edema, no cyanosis no clubbing SKIN:  Warm and dry.  No rashes NEURO:  Alert and oriented x 3. Cranial nerves II through XII intact. PSYCH:  Cognitively intact    ASSESSMENT & PLAN:    CAD: Anterior STEMI with DES to ostial LAD on February 09, 2019.  Residual 80% mid to distal LAD lesion that will be treated medically. No angina. On ASA only now. Continue beta blocker and statin    Ischemic cardiomyopathy: Echo showed normalization of LV function in May 2020. Continue carvedilol and losartan. Echo in September showed normal LV function.    Hypertension: Blood pressure is well controlled.    Hyperlipidemia: now on Crestor. Last LDL 50.   5. Carotid arterial disease- moderate bilateral. Continue risk factor modification. Last dopplers unchanged. Follow yearly  6.  Obesity. Discussed importance of regular aerobic exercise. Needs to follow low carb diet.    Medication Changes: No orders of the defined types were placed in this encounter.    Follow Up:  In Person in 6 month(s)  Signed, Indira Sorenson Martinique, MD  03/17/2023 10:36 AM    Rachel Kennedy

## 2023-03-17 ENCOUNTER — Encounter: Payer: Self-pay | Admitting: Cardiology

## 2023-03-17 ENCOUNTER — Ambulatory Visit: Payer: Medicare PPO | Attending: Cardiology | Admitting: Cardiology

## 2023-03-17 VITALS — BP 132/74 | HR 59 | Ht 66.0 in | Wt 205.2 lb

## 2023-03-17 DIAGNOSIS — I25118 Atherosclerotic heart disease of native coronary artery with other forms of angina pectoris: Secondary | ICD-10-CM | POA: Diagnosis not present

## 2023-03-17 DIAGNOSIS — I6523 Occlusion and stenosis of bilateral carotid arteries: Secondary | ICD-10-CM

## 2023-03-17 DIAGNOSIS — I10 Essential (primary) hypertension: Secondary | ICD-10-CM | POA: Diagnosis not present

## 2023-03-17 DIAGNOSIS — E785 Hyperlipidemia, unspecified: Secondary | ICD-10-CM

## 2023-03-17 NOTE — Patient Instructions (Signed)
Medication Instructions:  Continue same medications *If you need a refill on your cardiac medications before your next appointment, please call your pharmacy*   Lab Work: None ordered   Testing/Procedures: None ordered   Follow-Up: At [facility], you and your health needs are our priority.  As part of our continuing mission to provide you with exceptional heart care, we have created designated Provider Care Teams.  These Care Teams include your primary Cardiologist (physician) and Advanced Practice Providers (APPs -  Physician Assistants and Nurse Practitioners) who all work together to provide you with the care you need, when you need it.  We recommend signing up for the patient portal called "MyChart".  Sign up information is provided on this After Visit Summary.  MyChart is used to connect with patients for Virtual Visits (Telemedicine).  Patients are able to view lab/test results, encounter notes, upcoming appointments, etc.  Non-urgent messages can be sent to your provider as well.   To learn more about what you can do with MyChart, go to [email or web address].    Your next appointment:  6 months    Provider:  Dr.Jordan

## 2023-03-31 DIAGNOSIS — F321 Major depressive disorder, single episode, moderate: Secondary | ICD-10-CM | POA: Diagnosis not present

## 2023-03-31 DIAGNOSIS — Z6834 Body mass index (BMI) 34.0-34.9, adult: Secondary | ICD-10-CM | POA: Diagnosis not present

## 2023-03-31 DIAGNOSIS — I2510 Atherosclerotic heart disease of native coronary artery without angina pectoris: Secondary | ICD-10-CM | POA: Diagnosis not present

## 2023-03-31 DIAGNOSIS — E6609 Other obesity due to excess calories: Secondary | ICD-10-CM | POA: Diagnosis not present

## 2023-03-31 DIAGNOSIS — E782 Mixed hyperlipidemia: Secondary | ICD-10-CM | POA: Diagnosis not present

## 2023-03-31 DIAGNOSIS — E7849 Other hyperlipidemia: Secondary | ICD-10-CM | POA: Diagnosis not present

## 2023-04-20 DIAGNOSIS — Z01818 Encounter for other preprocedural examination: Secondary | ICD-10-CM | POA: Diagnosis not present

## 2023-04-20 DIAGNOSIS — H25812 Combined forms of age-related cataract, left eye: Secondary | ICD-10-CM | POA: Diagnosis not present

## 2023-05-10 DIAGNOSIS — H25812 Combined forms of age-related cataract, left eye: Secondary | ICD-10-CM | POA: Diagnosis not present

## 2023-05-24 DIAGNOSIS — H25811 Combined forms of age-related cataract, right eye: Secondary | ICD-10-CM | POA: Diagnosis not present

## 2023-06-04 ENCOUNTER — Telehealth: Payer: Self-pay | Admitting: Student

## 2023-06-04 ENCOUNTER — Encounter (HOSPITAL_COMMUNITY): Payer: Self-pay | Admitting: Emergency Medicine

## 2023-06-04 MED ORDER — CARVEDILOL 3.125 MG PO TABS: 3.1250 mg | ORAL_TABLET | Freq: Two times a day (BID) | ORAL | 3 refills | Status: DC

## 2023-06-04 NOTE — Progress Notes (Signed)
Pts heart rate 38-45 post workout.  BP 112/62.  Pt denies any pain or discomfort.  Pt place on cardiac monitor, monitor shows SB with noted pauses, less than 3 second.  Dr Wyline Mood called, informed to call Dr Jenene Slicker. Dr Jenene Slicker called and in to see pt at 2:25.  Dr Jenene Slicker instructed pt to follow up with PCP or Cardiologist for sleep study.  Pt reports falling asleep at 5 pm and sleeping until 11 pm.  She then eats and goes back to bed.  Pt went home and says she will follow up with her doctors.

## 2023-06-04 NOTE — Telephone Encounter (Signed)
   We were notified by cardiac rehab that the patient was having bradycardia with pauses as documented in the note earlier today. I reviewed with Dr. Jenene Slicker after the patient was discharged and she did recommend that the patient reduce her Coreg to 3.125mg  BID. Please make the patient aware and send a new Rx to her pharmacy. Would encourage her to follow HR/BP at home if able and report back if recurrent bradycardia. She does have scheduled follow-up with Dr. [place].   Signed, Ellsworth Lennox, PA-C 06/04/2023, 4:27 PM

## 2023-06-04 NOTE — Telephone Encounter (Signed)
I spoke with patient.She will decrease Coreg to 3.125 mg bid and monitor home BP/HR and report back if she has recurrent bradycardia.E-scribed rx to [facility]

## 2023-06-25 DIAGNOSIS — B379 Candidiasis, unspecified: Secondary | ICD-10-CM | POA: Diagnosis not present

## 2023-06-25 DIAGNOSIS — E669 Obesity, unspecified: Secondary | ICD-10-CM | POA: Diagnosis not present

## 2023-06-25 DIAGNOSIS — Z6832 Body mass index (BMI) 32.0-32.9, adult: Secondary | ICD-10-CM | POA: Diagnosis not present

## 2023-08-09 ENCOUNTER — Telehealth (HOSPITAL_COMMUNITY): Payer: Self-pay | Admitting: Emergency Medicine

## 2023-08-09 NOTE — Telephone Encounter (Signed)
F/u with pt in maintenane. Having dental issues. Plan to attend on Wed 0821/24.

## 2023-08-15 NOTE — Progress Notes (Unsigned)
Date:  08/18/2023   ID:  Rachel, Kennedy Oct 04, 1946, MRN [id number]   PCP:  Assunta Found, MD  Cardiologist:  Easton Sivertson Swaziland, MD  Electrophysiologist:  None   Evaluation Performed:  Follow-Up Visit  Chief Complaint:  Follow up CAD  History of Present Illness:    Rachel Kennedy is a 77 y.o. female with PMH of HTN, HLD, and  CAD.  Patient was admitted to the hospital on 02/09/2019 with code STEMI.  Initial EKG was consistent with anterior STEMI.   Initial troponin was 0.24.  Patient underwent emergent cardiac catheterization on 02/09/2019 which revealed 50% proximal to mid RCA lesion, 50% ostial to proximal left circumflex lesion, 80% mid to distal LAD lesion, 100% ostial to proximal LAD occlusion treated with Onyx resolute 3.0 x 30 mm DES, EF 25 to 35%, trivial 1+ MR.  Her troponin peaked at 57.07 before trending back down.  Echocardiogram obtained on the following day showed EF 35 to 40%, moderate hypokinesis of the LV basal anteroseptal and basal inferoseptal region, mild sclerosis of the aortic valve.   Patient was discharged on aspirin and Brilinta along with carvedilol and losartan.  Blood pressure in the hospital was too low for Entresto.  She was admitted overnight on 05/07/19 with an episode of near syncope. She was not orthostatic. Troponin negative. Echo showed normalization of LV function. Carotid dopplers showed moderate bilateral disease with > 70% on the right. CTA revealed 60 % stenosis if left ICA and 50% in right ICA. Head CT was normal. Repeat carotid dopplers in November showed bilateral 60-79% disease. Repeat dopplers in Jan 2023 and Jan 2024 showed 50-79% LICA stenosis. Stable. Last Echo in Sept 2023 showed normal LV function.   On follow up today she is doing well. She is participating in the maintenance Cardiac Rehab maintenance program. BP has been well controlled there.  She has lost 2 lbs.  No chest pain or dyspnea. No TIA or CVA symptoms  Past Medical History:   Diagnosis Date   Acute systolic (congestive) heart failure (HCC)    CAD (coronary artery disease) 05/07/2019   Depression 05/07/2019   Hyperlipidemia    Hypertension    STEMI (ST elevation myocardial infarction) [facility])    Past Surgical History:  Procedure Laterality Date   CORONARY/GRAFT ACUTE MI REVASCULARIZATION N/A 02/09/2019   Procedure: CORONARY/GRAFT ACUTE MI REVASCULARIZATION;  Surgeon: Swaziland, Shaw Dobek M, MD;  Location: [facility] INVASIVE CV LAB;  Service: Cardiovascular;  Laterality: N/A;   FRACTURE SURGERY     LEFT HEART CATH AND CORONARY ANGIOGRAPHY N/A 02/09/2019   Procedure: LEFT HEART CATH AND CORONARY ANGIOGRAPHY;  Surgeon: Swaziland, Zamariya Neal M, MD;  Location: [facility] INVASIVE CV LAB;  Service: Cardiovascular;  Laterality: N/A;   OVARY SURGERY       Current Meds  Medication Sig   acetaminophen (TYLENOL) 500 MG tablet Take 500 mg by mouth every 6 (six) hours as needed for headache (pain).   aspirin EC 81 MG tablet Take 1 tablet (81 mg total) by mouth daily. Swallow whole.   carvedilol (COREG) 3.125 MG tablet Take 1 tablet (3.125 mg total) by mouth 2 (two) times daily.   losartan (COZAAR) 25 MG tablet TAKE 1 TABLET (25 MG TOTAL) BY MOUTH DAILY.   nitroGLYCERIN (NITROSTAT) 0.4 MG SL tablet Place 1 tablet (0.4 mg total) under the tongue every 5 (five) minutes x 3 doses as needed for chest pain.   rosuvastatin (CRESTOR) 10 MG tablet TAKE 1 TABLET BY  MOUTH EVERY DAY     Allergies:   Patient has no known allergies.   Social History   Tobacco Use   Smoking status: Former   Smokeless tobacco: Never   Tobacco comments:    quit in 2017. Smoked for 30 years prior to that.   Vaping Use   Vaping status: Never Used  Substance Use Topics   Alcohol use: No    Alcohol/week: 0.0 standard drinks of alcohol   Drug use: No     Family Hx: The patient's family history includes Heart disease in her father; Transient ischemic attack in her mother.  ROS:   Please see the history of present illness.     All other systems reviewed and are negative.   Prior CV studies:   The following studies were reviewed today:  Cath 02/09/2019 Prox RCA to Mid RCA lesion is 50% stenosed. Ost Cx to Prox Cx lesion is 30% stenosed. Mid LAD to Dist LAD lesion is 80% stenosed. Ost LAD to Prox LAD lesion is 100% stenosed. Post intervention, there is a 0% residual stenosis. A drug-eluting stent was successfully placed using a STENT RESOLUTE ONYX 3.0X30. There is severe left ventricular systolic dysfunction. LV end diastolic pressure is normal. The left ventricular ejection fraction is 25-35% by visual estimate. There is trivial (1+) mitral regurgitation.   1. 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 Diffuse 50% proximal to mid RCA 3. Severe LV dysfunction with anterolateral akinesis. EF 30-35%. 4. Normal LVEDP 5. Successful PCI with stenting of the proximal LAD with DES. There is persistent diffuse narrowing of the mid to distal LAD of unclear etiology ? Spasm   Plan: DAPT for one year. Aggrastat for 6 hours. Optimize medical therapy with statin, beta blocker, ARB. If EF <35% by Echo consider Entresto. May also need to consider Lifevest at DC.      Echo 02/10/2019  1. The left ventricle has moderately reduced systolic function, with an ejection fraction of 35-40%. The cavity size was normal. Left ventricular diastolic Doppler parameters are consistent with impaired relaxation.  2. Severe hypokinesis of the left ventricular mid anteroseptum and inferoseptum, anteroapex and apical septum.  3. Moderate hypokinesis of the left ventricular basal anteroseptum and basal inferoseptum.  4. The tricuspid valve is normal in structure.  5. The pulmonic valve was normal in structure.  6. The mitral valve is normal in structure.  7. The aortic valve is normal in structure. Mild sclerosis of the aortic valve.   SUMMARY   Endocardial border not optimally visualized for wall motion abnormalities, future  exams should be performed with Definity IV LV Enhancement  Echo 05/07/19: IMPRESSIONS      1. The left ventricle has normal systolic function with an ejection fraction of 55-60%. The cavity size was normal. There is moderate concentric left ventricular hypertrophy. Left ventricular diastolic Doppler parameters are consistent with impaired  relaxation. There is hypokinesis of the mid anteroseptal and apical anterior walls.  2. The right ventricle has normal systolic function. The cavity was normal. There is no increase in right ventricular wall thickness.  3. Left atrial size was mildly dilated.    Carotid dopplers 05/07/19:  CLINICAL DATA:  Syncopal episode earlier today. History of CAD, hypertension and hyperlipidemia.   EXAM: BILATERAL CAROTID DUPLEX ULTRASOUND   TECHNIQUE: Wallace Cullens scale imaging, color Doppler and duplex ultrasound were performed of bilateral carotid and vertebral arteries in the neck.   COMPARISON:  None.   FINDINGS: Criteria: Quantification of  carotid stenosis is based on velocity parameters that correlate the residual internal carotid diameter with NASCET-based stenosis levels, using the diameter of the distal internal carotid lumen as the denominator for stenosis measurement.   The following velocity measurements were obtained:   RIGHT   ICA: 177/28 cm/sec   CCA: 119/13 cm/sec   SYSTOLIC ICA/CCA RATIO:  1.5   ECA: 134 cm/sec   LEFT   ICA: 300/64 cm/sec   CCA: 113/11 cm/sec   SYSTOLIC ICA/CCA RATIO:  2.6   ECA: 141 cm/sec   RIGHT CAROTID ARTERY: There is a moderate amount of circumferential mixed echogenic plaque within the right carotid bulb (images 14 and 16), extending to involve the origin and proximal aspects of the right internal carotid artery (is 24, which results in elevated peak systolic velocities throughout the interrogated course the right internal carotid artery. Greatest acquired peak systolic velocity within the mid aspect  the right internal carotid artery measures 177 centimeters/second (image 29).   RIGHT VERTEBRAL ARTERY:  Antegrade Flow   LEFT CAROTID ARTERY: Circumferential intimal thickening/hypoechoic plaque throughout the left common carotid artery (image 41, 44 and 45). There is a moderate to large amount of eccentric mixed echogenic plaque within the left carotid bulb (image 50), extending to involve the origin and proximal aspect the left internal carotid artery (image 58, which results in elevated peak systolic velocities within the proximal mid aspects of the left internal carotid artery. Greatest acquired peak systolic velocity within the proximal left ICA measures 300 centimeters/second (image 60).   LEFT VERTEBRAL ARTERY:  Antegrade Flow   IMPRESSION: Moderate to large amount of bilateral atherosclerotic plaque, right greater than left, results in elevated peak systolic velocities within the bilateral internal carotid arteries, the right >70%, and the left compatible with the 50-69% luminal narrowing range.   Further evaluation with CTA could be performed as clinically indicated.     Electronically Signed   By: Simonne Come M.D.   On: 05/07/2019 11:01  Echo 09/14/22: IMPRESSIONS     1. Left ventricular ejection fraction, by estimation, is 55 to 60%. The  left ventricle has normal function. The left ventricle demonstrates  regional wall motion abnormalities (see scoring diagram/findings for  description). There is moderate asymmetric  left ventricular hypertrophy of the basal segment. Left ventricular  diastolic parameters are consistent with Grade I diastolic dysfunction  (impaired relaxation).   2. Right ventricular systolic function is normal. The right ventricular  size is normal. Tricuspid regurgitation signal is inadequate for assessing  PA pressure.   3. Left atrial size was upper normal.   4. The mitral valve is grossly normal. Trivial mitral valve  regurgitation.    5. The aortic valve is tricuspid. Aortic valve regurgitation is not  visualized. Aortic valve sclerosis is present, with no evidence of aortic  valve stenosis.   6. The inferior vena cava is normal in size with greater than 50%  respiratory variability, suggesting right atrial pressure of 3 mmHg.   Comparison(s): Prior images reviewed side by side. LVEF overall normal  range at 55-60%. Wall motion abnormalities more evidence on the current  study, previous images were less optimal.   Carotid dopplers 01/11/23: Summary:  Right Carotid: Velocities in the right ICA are consistent with a 1-39%  stenosis.   Left Carotid: Velocities in the left ICA are consistent with a 60-79%  stenosis.               Non-hemodynamically significant plaque <50% noted in the  CCA.   Vertebrals: Bilateral vertebral arteries demonstrate antegrade flow.  Subclavians: Normal flow hemodynamics were seen in bilateral subclavian               arteries.   *See table(s) above for measurements and observations.  Suggest follow up study in 12 months.    Electronically signed by Lorine Bears MD on 01/13/2023 at 8:17:21 AM   Labs/Other Tests and Data Reviewed:    EKG:   is not done today.    Recent Labs: 10/16/2022: ALT 19; BUN 11; Creatinine, Ser 0.71; Hemoglobin 14.2; Platelets 179; Potassium 4.2; Sodium 140   Recent Lipid Panel Lab Results  Component Value Date/Time   CHOL 138 10/16/2022 11:35 AM   TRIG 123 10/16/2022 11:35 AM   HDL 56 10/16/2022 11:35 AM   CHOLHDL 2.5 10/16/2022 11:35 AM   CHOLHDL 2.6 04/10/2019 11:12 AM   LDLCALC 60 10/16/2022 11:35 AM   Dated 05/10/20: A1c 5.9%. cholesterol 121, triglycerides 107, HDL 50, LDL 51. CBC, TSH normal. AST 55, ALT 54.    Wt Readings from Last 3 Encounters:  08/18/23 203 lb (92.1 kg)  03/17/23 205 lb 3.2 oz (93.1 kg)  09/11/22 209 lb 12.8 oz (95.2 kg)     Objective:    Vital Signs:  BP (!) 146/68   Pulse 64   Ht 5\' 6"  (1.676 m)   Wt 203 lb  (92.1 kg)   SpO2 92%   BMI 32.77 kg/m    GENERAL:  Well appearing WF in NAD HEENT:  PERRL, EOMI, sclera are clear. Oropharynx is clear. NECK:  No jugular venous distention, carotid upstroke brisk and symmetric, no bruits, no thyromegaly or adenopathy LUNGS:  Clear to auscultation bilaterally CHEST:  Unremarkable HEART:  RRR,  PMI not displaced or sustained,S1 and S2 within normal limits, no S3, no S4: no clicks, no rubs, no murmurs ABD:  Soft, nontender. BS +, no masses or bruits. No hepatomegaly, no splenomegaly EXT:  2 + pulses throughout, trace ankle edema, no cyanosis no clubbing SKIN:  Warm and dry.  No rashes NEURO:  Alert and oriented x 3. Cranial nerves II through XII intact. PSYCH:  Cognitively intact    ASSESSMENT & PLAN:    CAD: Anterior STEMI with DES to ostial LAD on February 09, 2019. Residual 80% mid to distal LAD lesion that will be treated medically. No angina. On ASA only now. Continue beta blocker and statin    Ischemic cardiomyopathy: Echo showed normalization of LV function.Continue carvedilol and losartan.    Hypertension: Blood pressure is well controlled.    Hyperlipidemia: now on Crestor. Last LDL 50 at Cardiac Rehab   5. Carotid arterial disease- moderate bilateral. Continue risk factor modification. Last dopplers unchanged. Follow yearly  6.  Obesity. Discussed importance of regular aerobic exercise. Needs to follow low carb diet.   7. Pre diabetes. Will update A1c. Would like to consider for a GLP 1 agonist to help with weight loss, reduce risk of DM and also lower CV risk. Will have her see our Pharm D.    Medication Changes: No orders of the defined types were placed in this encounter.    Follow Up:  In Person in 6 month(s)  Signed, Imari Reen Swaziland, MD  08/18/2023 10:17 AM    [facility]

## 2023-08-18 ENCOUNTER — Encounter: Payer: Self-pay | Admitting: Cardiology

## 2023-08-18 ENCOUNTER — Ambulatory Visit: Payer: Medicare PPO | Attending: Cardiology | Admitting: Cardiology

## 2023-08-18 VITALS — BP 146/68 | HR 64 | Ht 66.0 in | Wt 203.0 lb

## 2023-08-18 DIAGNOSIS — E785 Hyperlipidemia, unspecified: Secondary | ICD-10-CM | POA: Diagnosis not present

## 2023-08-18 DIAGNOSIS — I10 Essential (primary) hypertension: Secondary | ICD-10-CM

## 2023-08-18 DIAGNOSIS — I6523 Occlusion and stenosis of bilateral carotid arteries: Secondary | ICD-10-CM

## 2023-08-18 DIAGNOSIS — R7303 Prediabetes: Secondary | ICD-10-CM

## 2023-08-18 DIAGNOSIS — I25118 Atherosclerotic heart disease of native coronary artery with other forms of angina pectoris: Secondary | ICD-10-CM

## 2023-08-18 DIAGNOSIS — R739 Hyperglycemia, unspecified: Secondary | ICD-10-CM | POA: Diagnosis not present

## 2023-08-18 NOTE — Patient Instructions (Signed)
Medication Instructions:  NO CHANGES *If you need a refill on your cardiac medications before your next appointment, please call your pharmacy*   Lab Work: CMET,LIPID,A1C,CBC TODAY. If you have labs (blood work) drawn today and your tests are completely normal, you will receive your results only by: MyChart Message (if you have MyChart) OR A paper copy in the mail If you have any lab test that is abnormal or we need to change your treatment, we will call you to review the results.   Testing/Procedures: NO TESTING   Follow-Up: At [facility], you and your health needs are our priority.  As part of our continuing mission to provide you with exceptional heart care, we have created designated Provider Care Teams.  These Care Teams include your primary Cardiologist (physician) and Advanced Practice Providers (APPs -  Physician Assistants and Nurse Practitioners) who all work together to provide you with the care you need, when you need it.  Your next appointment:   6 month(s)  Provider:   Peter Swaziland, MD  Other Instructions REFERRAL TO SEE HEART CARE PHARMACY FOR WEIGHT LOSS MANAGEMENT.

## 2023-08-19 LAB — COMPREHENSIVE METABOLIC PANEL
ALT: 14 IU/L (ref 0–32)
AST: 15 IU/L (ref 0–40)
Albumin: 4.6 g/dL (ref 3.8–4.8)
Alkaline Phosphatase: 87 IU/L (ref 44–121)
BUN/Creatinine Ratio: 29 — ABNORMAL HIGH (ref 12–28)
BUN: 22 mg/dL (ref 8–27)
Bilirubin Total: 0.4 mg/dL (ref 0.0–1.2)
CO2: 24 mmol/L (ref 20–29)
Calcium: 9.8 mg/dL (ref 8.7–10.3)
Chloride: 104 mmol/L (ref 96–106)
Creatinine, Ser: 0.75 mg/dL (ref 0.57–1.00)
Globulin, Total: 2.4 g/dL (ref 1.5–4.5)
Glucose: 104 mg/dL — ABNORMAL HIGH (ref 70–99)
Potassium: 4.7 mmol/L (ref 3.5–5.2)
Sodium: 140 mmol/L (ref 134–144)
Total Protein: 7 g/dL (ref 6.0–8.5)
eGFR: 83 mL/min/{1.73_m2} (ref >59)

## 2023-08-19 LAB — LIPID PANEL
Chol/HDL Ratio: 3 ratio (ref 0.0–4.4)
Cholesterol, Total: 140 mg/dL (ref 100–199)
HDL: 46 mg/dL (ref >39)
LDL Chol Calc (NIH): 69 mg/dL (ref 0–99)
Triglycerides: 142 mg/dL (ref 0–149)
VLDL Cholesterol Cal: 25 mg/dL (ref 5–40)

## 2023-08-19 LAB — CBC
Hematocrit: 40.9 % (ref 34.0–46.6)
Hemoglobin: 13.4 g/dL (ref 11.1–15.9)
MCH: 28.2 pg (ref 26.6–33.0)
MCHC: 32.8 g/dL (ref 31.5–35.7)
MCV: 86 fL (ref 79–97)
Platelets: 181 10*3/uL (ref 150–450)
RBC: 4.75 x10E6/uL (ref 3.77–5.28)
RDW: 12.8 % (ref 11.7–15.4)
WBC: 6.4 10*3/uL (ref 3.4–10.8)

## 2023-08-19 LAB — HEMOGLOBIN A1C
Est. average glucose Bld gHb Est-mCnc: 123 mg/dL
Hgb A1c MFr Bld: 5.9 % — ABNORMAL HIGH (ref 4.8–5.6)

## 2023-08-20 ENCOUNTER — Other Ambulatory Visit: Payer: Self-pay

## 2023-08-20 DIAGNOSIS — E785 Hyperlipidemia, unspecified: Secondary | ICD-10-CM

## 2023-08-20 DIAGNOSIS — I25118 Atherosclerotic heart disease of native coronary artery with other forms of angina pectoris: Secondary | ICD-10-CM

## 2023-08-20 MED ORDER — ROSUVASTATIN CALCIUM 20 MG PO TABS: 20.0000 mg | ORAL_TABLET | Freq: Every day | ORAL | 3 refills | Status: DC

## 2023-09-07 ENCOUNTER — Other Ambulatory Visit (HOSPITAL_COMMUNITY): Payer: Self-pay

## 2023-09-07 ENCOUNTER — Ambulatory Visit: Payer: Medicare PPO | Attending: Cardiology | Admitting: Pharmacist

## 2023-09-07 ENCOUNTER — Telehealth: Payer: Self-pay | Admitting: Pharmacist

## 2023-09-07 ENCOUNTER — Encounter: Payer: Self-pay | Admitting: Pharmacist

## 2023-09-07 ENCOUNTER — Telehealth: Payer: Self-pay | Admitting: Pharmacy Technician

## 2023-09-07 VITALS — Wt 202.2 lb

## 2023-09-07 DIAGNOSIS — I25118 Atherosclerotic heart disease of native coronary artery with other forms of angina pectoris: Secondary | ICD-10-CM

## 2023-09-07 DIAGNOSIS — I2102 ST elevation (STEMI) myocardial infarction involving left anterior descending coronary artery: Secondary | ICD-10-CM

## 2023-09-07 NOTE — Patient Instructions (Addendum)
It was nice meeting you today  The medication we discussed today is called Reginal Lutes which is an injection you would take once a week  I will complete the prior authorization for you and let you know what their response is  Try to focus on vegetables, lean proteins, healthy fats and whole grains  Please let me know if you have any questions  Laural Golden, PharmD, BCACP, CDCES, CPP [address], [address] [place], [place], [address] Phone: [phone], Fax: [phone]

## 2023-09-07 NOTE — Progress Notes (Signed)
Patient ID: Rachel Kennedy                 DOB: Jul 08, 1946                    MRN: [id number]     HPI: BRECKIN BRUN is a 77 y.o. female patient referred to pharmacy clinic by Dr Swaziland to initiate GLP1-RA therapy. PMH is significant for STEMI, CHF, HTN, CAD. HLD, and obesity. Most recent BMI 32.65.  Patient presents today to discuss [facility] for CAD protection. Had STEMI in 2020 and has been attending cardiac rehab 3 times weekly.   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 Unfortunately says her neighborhood and surrounding areas are not ideal for outdoor exercise. Is considering moving back to [facility] for parks/gyms/ more amenities.  Breakfast is a bagel with cream cheese. Dinner is typically a protein and 2 vegetables with bread. Drinks water and coffee. Does not drink soda or sweet tea. Last A1c 5.9%   Labs: Lab Results  Component Value Date   HGBA1C 5.9 (H) 08/18/2023    Wt Readings from Last 1 Encounters:  08/18/23 203 lb (92.1 kg)    BP Readings from Last 1 Encounters:  08/18/23 (!) 146/68   Pulse Readings from Last 1 Encounters:  08/18/23 64       Component Value Date/Time   CHOL 140 08/18/2023 1059   TRIG 142 08/18/2023 1059   HDL 46 08/18/2023 1059   CHOLHDL 3.0 08/18/2023 1059   CHOLHDL 2.6 04/10/2019 1112   VLDL 20 04/10/2019 1112   LDLCALC 69 08/18/2023 1059    Past Medical History:  Diagnosis Date   Acute systolic (congestive) heart failure (HCC)    CAD (coronary artery disease) 05/07/2019   Depression 05/07/2019   Hyperlipidemia    Hypertension    STEMI (ST elevation myocardial infarction) [facility])     Current Outpatient Medications on File Prior to Visit  Medication Sig Dispense Refill   chlorhexidine (PERIDEX) 0.12 % solution Use as directed in the mouth or throat.     escitalopram (LEXAPRO) 10 MG tablet Take 10 mg by mouth daily.     nystatin cream (MYCOSTATIN) Apply 1 Application topically 3 (three) times daily.     prednisoLONE acetate (PRED  FORTE) 1 % ophthalmic suspension PLEASE SEE ATTACHED FOR DETAILED DIRECTIONS     acetaminophen (TYLENOL) 500 MG tablet Take 500 mg by mouth every 6 (six) hours as needed for headache (pain).     aspirin EC 81 MG tablet Take 1 tablet (81 mg total) by mouth daily. Swallow whole. 90 tablet 3   carvedilol (COREG) 3.125 MG tablet Take 1 tablet (3.125 mg total) by mouth 2 (two) times daily. 180 tablet 3   losartan (COZAAR) 25 MG tablet TAKE 1 TABLET (25 MG TOTAL) BY MOUTH DAILY. 90 tablet 3   nitroGLYCERIN (NITROSTAT) 0.4 MG SL tablet Place 1 tablet (0.4 mg total) under the tongue every 5 (five) minutes x 3 doses as needed for chest pain. 25 tablet 1   rosuvastatin (CRESTOR) 20 MG tablet Take 1 tablet (20 mg total) by mouth daily. 90 tablet 3   No current facility-administered medications on file prior to visit.    No Known Allergies   Assessment/Plan:  1. CAD/STEMI- Patient would benefit from further weight and loss and cardiac prevention. Recommended starting Wegovy. Confirmed patient  has no personal or family history of medullary thyroid carcinoma (MTC) or Multiple Endocrine Neoplasia syndrome type 2 (MEN 2). Injection technique  reviewed at today's visit. Patient able to demonstrate in room.  Advised patient on common side effects including nausea, diarrhea, dyspepsia, decreased appetite, and fatigue. Counseled patient on reducing meal size and how to titrate medication to minimize side effects. Counseled patient to call if intolerable side effects or if experiencing dehydration, abdominal pain, or dizziness. Patient will try to adhere to dietary modifications and will target at least 150 minutes of moderate intensity exercise weekly.   Laural Golden, PharmD, BCACP, CDCES, CPP [address] [place], [place], [address] Phone: [phone], Fax: [phone]

## 2023-09-07 NOTE — Telephone Encounter (Signed)
PA request has been Submitted. New Encounter created for follow up. For additional info see Pharmacy Prior Auth telephone encounter from 09/07/23.

## 2023-09-07 NOTE — Telephone Encounter (Signed)
Pharmacy Patient Advocate Encounter   Received notification from Pt Calls Messages that prior authorization for wegovy is required/requested.   Insurance verification completed.   The patient is insured through [employer] .   Per test claim: PA required; PA submitted to [facility] via CoverMyMeds Key/confirmation #/EOC [id number] Status is pending

## 2023-09-08 ENCOUNTER — Other Ambulatory Visit (HOSPITAL_COMMUNITY): Payer: Self-pay

## 2023-09-08 NOTE — Telephone Encounter (Signed)
Pharmacy Patient Advocate Encounter  Received notification from [facility] that Prior Authorization for wegovy has been APPROVED from 02/26/23 to 12/21/23. Ran test claim, Copay is $64.00. This test claim was processed through [facility]- copay amounts may vary at other pharmacies due to pharmacy/plan contracts, or as the patient moves through the different stages of their insurance plan.   PA #/Case ID/Reference #: [id number]

## 2023-09-09 NOTE — Telephone Encounter (Signed)
Called patient and LMOM that Reginal Lutes was approved and asked for call back

## 2023-09-13 ENCOUNTER — Other Ambulatory Visit (HOSPITAL_COMMUNITY): Payer: Self-pay | Admitting: Family Medicine

## 2023-09-13 DIAGNOSIS — Z1231 Encounter for screening mammogram for malignant neoplasm of breast: Secondary | ICD-10-CM

## 2023-09-14 ENCOUNTER — Other Ambulatory Visit: Payer: Self-pay

## 2023-09-14 ENCOUNTER — Encounter: Payer: Self-pay | Admitting: Pharmacist

## 2023-09-14 ENCOUNTER — Other Ambulatory Visit (HOSPITAL_COMMUNITY): Payer: Self-pay

## 2023-09-14 MED ORDER — WEGOVY 0.25 MG/0.5ML ~~LOC~~ SOAJ
0.2500 mg | SUBCUTANEOUS | 0 refills | Status: DC
  Filled 2023-09-14 – 2023-09-22 (×2): qty 2, 28d supply, fill #0

## 2023-09-14 NOTE — Addendum Note (Signed)
Addended by: Cheree Ditto on: 09/14/2023 08:21 AM   Modules accepted: Orders

## 2023-09-14 NOTE — Telephone Encounter (Signed)
Spoke with patient and let her know Wegovy approved. Requests delivery services from [employer]

## 2023-09-15 ENCOUNTER — Ambulatory Visit (HOSPITAL_COMMUNITY)
Admission: RE | Admit: 2023-09-15 | Discharge: 2023-09-15 | Disposition: A | Discharge status: Home / Self Care | Payer: Medicare PPO | Source: Ambulatory Visit | Attending: Family Medicine | Admitting: Family Medicine

## 2023-09-15 ENCOUNTER — Encounter (HOSPITAL_COMMUNITY): Payer: Self-pay

## 2023-09-15 DIAGNOSIS — Z1231 Encounter for screening mammogram for malignant neoplasm of breast: Secondary | ICD-10-CM | POA: Insufficient documentation

## 2023-09-17 ENCOUNTER — Other Ambulatory Visit: Payer: Self-pay

## 2023-09-22 ENCOUNTER — Telehealth: Payer: Self-pay | Admitting: Pharmacist

## 2023-09-22 ENCOUNTER — Other Ambulatory Visit (HOSPITAL_BASED_OUTPATIENT_CLINIC_OR_DEPARTMENT_OTHER): Payer: Self-pay

## 2023-09-22 ENCOUNTER — Other Ambulatory Visit (HOSPITAL_COMMUNITY): Payer: Self-pay

## 2023-09-22 NOTE — Telephone Encounter (Signed)
Received call from patient that she had not received her [facility]. Advised she should contact [facility] pharmacy to check on status

## 2023-09-23 ENCOUNTER — Other Ambulatory Visit: Payer: Self-pay

## 2023-09-28 ENCOUNTER — Other Ambulatory Visit (HOSPITAL_COMMUNITY): Payer: Self-pay

## 2023-10-24 ENCOUNTER — Other Ambulatory Visit: Payer: Self-pay | Admitting: Cardiology

## 2023-10-24 DIAGNOSIS — I2102 ST elevation (STEMI) myocardial infarction involving left anterior descending coronary artery: Secondary | ICD-10-CM

## 2023-10-24 DIAGNOSIS — I25118 Atherosclerotic heart disease of native coronary artery with other forms of angina pectoris: Secondary | ICD-10-CM

## 2023-10-25 ENCOUNTER — Encounter: Payer: Self-pay | Admitting: Pharmacist

## 2023-10-25 ENCOUNTER — Other Ambulatory Visit (HOSPITAL_COMMUNITY): Payer: Self-pay

## 2023-10-25 MED ORDER — WEGOVY 0.5 MG/0.5ML ~~LOC~~ SOAJ
0.5000 mg | SUBCUTANEOUS | 0 refills | Status: DC
  Filled 2023-10-25: qty 2, 28d supply, fill #0

## 2023-10-25 NOTE — Telephone Encounter (Signed)
Mychart message sent to see if pt would like to increase

## 2023-10-26 ENCOUNTER — Other Ambulatory Visit (HOSPITAL_COMMUNITY): Payer: Self-pay

## 2023-10-28 ENCOUNTER — Other Ambulatory Visit (HOSPITAL_COMMUNITY): Payer: Self-pay

## 2023-11-01 DIAGNOSIS — Z0001 Encounter for general adult medical examination with abnormal findings: Secondary | ICD-10-CM | POA: Diagnosis not present

## 2023-11-01 DIAGNOSIS — E6609 Other obesity due to excess calories: Secondary | ICD-10-CM | POA: Diagnosis not present

## 2023-11-01 DIAGNOSIS — I2510 Atherosclerotic heart disease of native coronary artery without angina pectoris: Secondary | ICD-10-CM | POA: Diagnosis not present

## 2023-11-01 DIAGNOSIS — Z6832 Body mass index (BMI) 32.0-32.9, adult: Secondary | ICD-10-CM | POA: Diagnosis not present

## 2023-11-01 DIAGNOSIS — F321 Major depressive disorder, single episode, moderate: Secondary | ICD-10-CM | POA: Diagnosis not present

## 2023-11-01 DIAGNOSIS — Z23 Encounter for immunization: Secondary | ICD-10-CM | POA: Diagnosis not present

## 2023-11-01 DIAGNOSIS — E1159 Type 2 diabetes mellitus with other circulatory complications: Secondary | ICD-10-CM | POA: Diagnosis not present

## 2023-11-01 DIAGNOSIS — Z1331 Encounter for screening for depression: Secondary | ICD-10-CM | POA: Diagnosis not present

## 2023-11-01 DIAGNOSIS — E782 Mixed hyperlipidemia: Secondary | ICD-10-CM | POA: Diagnosis not present

## 2023-11-10 ENCOUNTER — Other Ambulatory Visit (HOSPITAL_COMMUNITY): Payer: Self-pay

## 2023-11-19 ENCOUNTER — Other Ambulatory Visit (HOSPITAL_COMMUNITY): Payer: Self-pay

## 2023-11-19 ENCOUNTER — Other Ambulatory Visit: Payer: Self-pay | Admitting: Cardiology

## 2023-11-19 DIAGNOSIS — I25118 Atherosclerotic heart disease of native coronary artery with other forms of angina pectoris: Secondary | ICD-10-CM

## 2023-11-19 DIAGNOSIS — I2102 ST elevation (STEMI) myocardial infarction involving left anterior descending coronary artery: Secondary | ICD-10-CM

## 2023-11-21 MED ORDER — WEGOVY 1 MG/0.5ML ~~LOC~~ SOAJ
1.0000 mg | SUBCUTANEOUS | 0 refills | Status: DC
  Filled 2023-11-21: qty 2, 28d supply, fill #0

## 2023-11-22 ENCOUNTER — Other Ambulatory Visit: Payer: Self-pay

## 2023-11-22 ENCOUNTER — Other Ambulatory Visit (HOSPITAL_COMMUNITY): Payer: Self-pay

## 2023-11-23 ENCOUNTER — Other Ambulatory Visit (HOSPITAL_COMMUNITY): Payer: Self-pay

## 2023-12-17 ENCOUNTER — Other Ambulatory Visit: Payer: Self-pay | Admitting: Cardiology

## 2023-12-17 ENCOUNTER — Other Ambulatory Visit (HOSPITAL_COMMUNITY): Payer: Self-pay

## 2023-12-17 DIAGNOSIS — I2102 ST elevation (STEMI) myocardial infarction involving left anterior descending coronary artery: Secondary | ICD-10-CM

## 2023-12-17 DIAGNOSIS — I25118 Atherosclerotic heart disease of native coronary artery with other forms of angina pectoris: Secondary | ICD-10-CM

## 2023-12-17 MED ORDER — WEGOVY 1 MG/0.5ML ~~LOC~~ SOAJ
1.0000 mg | SUBCUTANEOUS | 0 refills | Status: DC
  Filled 2023-12-17: qty 2, 28d supply, fill #0

## 2023-12-20 ENCOUNTER — Other Ambulatory Visit (HOSPITAL_COMMUNITY): Payer: Self-pay

## 2024-01-18 ENCOUNTER — Other Ambulatory Visit (HOSPITAL_COMMUNITY): Payer: Self-pay

## 2024-01-18 ENCOUNTER — Other Ambulatory Visit: Payer: Self-pay | Admitting: Cardiology

## 2024-01-18 DIAGNOSIS — I25118 Atherosclerotic heart disease of native coronary artery with other forms of angina pectoris: Secondary | ICD-10-CM

## 2024-01-18 DIAGNOSIS — I2102 ST elevation (STEMI) myocardial infarction involving left anterior descending coronary artery: Secondary | ICD-10-CM

## 2024-01-18 MED ORDER — WEGOVY 1 MG/0.5ML ~~LOC~~ SOAJ
1.0000 mg | SUBCUTANEOUS | 0 refills | Status: DC
  Filled 2024-01-18: qty 2, 28d supply, fill #0

## 2024-01-19 ENCOUNTER — Other Ambulatory Visit (HOSPITAL_COMMUNITY): Payer: Self-pay

## 2024-02-12 ENCOUNTER — Other Ambulatory Visit (HOSPITAL_COMMUNITY): Payer: Self-pay

## 2024-02-14 ENCOUNTER — Other Ambulatory Visit: Payer: Self-pay | Admitting: Cardiology

## 2024-02-14 DIAGNOSIS — I2102 ST elevation (STEMI) myocardial infarction involving left anterior descending coronary artery: Secondary | ICD-10-CM

## 2024-02-14 DIAGNOSIS — I25118 Atherosclerotic heart disease of native coronary artery with other forms of angina pectoris: Secondary | ICD-10-CM

## 2024-02-15 ENCOUNTER — Other Ambulatory Visit: Payer: Self-pay | Admitting: Cardiology

## 2024-02-15 ENCOUNTER — Other Ambulatory Visit (HOSPITAL_COMMUNITY): Payer: Self-pay

## 2024-02-15 DIAGNOSIS — I25118 Atherosclerotic heart disease of native coronary artery with other forms of angina pectoris: Secondary | ICD-10-CM

## 2024-02-15 DIAGNOSIS — I2102 ST elevation (STEMI) myocardial infarction involving left anterior descending coronary artery: Secondary | ICD-10-CM

## 2024-02-15 MED ORDER — WEGOVY 1.7 MG/0.75ML ~~LOC~~ SOAJ
1.7000 mg | SUBCUTANEOUS | 0 refills | Status: DC
  Filled 2024-02-15: qty 3, 30d supply, fill #0

## 2024-03-01 ENCOUNTER — Other Ambulatory Visit (HOSPITAL_COMMUNITY): Payer: Self-pay

## 2024-03-13 ENCOUNTER — Other Ambulatory Visit (HOSPITAL_COMMUNITY): Payer: Self-pay

## 2024-03-13 ENCOUNTER — Other Ambulatory Visit: Payer: Self-pay | Admitting: Cardiology

## 2024-03-13 DIAGNOSIS — I2102 ST elevation (STEMI) myocardial infarction involving left anterior descending coronary artery: Secondary | ICD-10-CM

## 2024-03-13 DIAGNOSIS — I25118 Atherosclerotic heart disease of native coronary artery with other forms of angina pectoris: Secondary | ICD-10-CM

## 2024-03-16 ENCOUNTER — Other Ambulatory Visit (HOSPITAL_COMMUNITY): Payer: Self-pay

## 2024-03-17 ENCOUNTER — Other Ambulatory Visit (HOSPITAL_COMMUNITY): Payer: Self-pay

## 2024-03-17 DIAGNOSIS — I25118 Atherosclerotic heart disease of native coronary artery with other forms of angina pectoris: Secondary | ICD-10-CM | POA: Diagnosis not present

## 2024-03-17 DIAGNOSIS — E785 Hyperlipidemia, unspecified: Secondary | ICD-10-CM | POA: Diagnosis not present

## 2024-03-17 MED ORDER — WEGOVY 2.4 MG/0.75ML ~~LOC~~ SOAJ
2.4000 mg | SUBCUTANEOUS | 5 refills | Status: DC
  Filled 2024-03-17: qty 3, 28d supply, fill #0
  Filled 2024-05-08: qty 3, 28d supply, fill #1
  Filled 2024-06-05: qty 3, 28d supply, fill #2
  Filled 2024-07-04: qty 3, 28d supply, fill #3
  Filled 2024-08-02: qty 3, 28d supply, fill #4
  Filled 2024-09-06: qty 3, 28d supply, fill #5

## 2024-03-17 MED ORDER — WEGOVY 1.7 MG/0.75ML ~~LOC~~ SOAJ
1.7000 mg | SUBCUTANEOUS | 0 refills | Status: DC
  Filled 2024-03-17: qty 3, 28d supply, fill #0

## 2024-03-17 NOTE — Addendum Note (Signed)
 Addended by: Cheree Ditto on: 03/17/2024 11:55 AM   Modules accepted: Orders

## 2024-03-18 LAB — HEPATIC FUNCTION PANEL
ALT: 10 IU/L (ref 0–32)
AST: 14 IU/L (ref 0–40)
Albumin: 4.4 g/dL (ref 3.8–4.8)
Alkaline Phosphatase: 87 IU/L (ref 44–121)
Bilirubin Total: 0.4 mg/dL (ref 0.0–1.2)
Bilirubin, Direct: 0.15 mg/dL (ref 0.00–0.40)
Total Protein: 6.7 g/dL (ref 6.0–8.5)

## 2024-03-18 LAB — LIPID PANEL
Chol/HDL Ratio: 2.2 ratio (ref 0.0–4.4)
Cholesterol, Total: 100 mg/dL (ref 100–199)
HDL: 45 mg/dL (ref >39)
LDL Chol Calc (NIH): 37 mg/dL (ref 0–99)
Triglycerides: 90 mg/dL (ref 0–149)
VLDL Cholesterol Cal: 18 mg/dL (ref 5–40)

## 2024-03-28 ENCOUNTER — Other Ambulatory Visit (HOSPITAL_COMMUNITY): Payer: Self-pay

## 2024-05-05 NOTE — Progress Notes (Signed)
 Date:  05/09/2024   ID:  [name], [name] 12-01-1946, MRN [id number]   PCP:  Minus Amel, MD  Cardiologist:  Saivion Goettel [place], MD  Electrophysiologist:  None   Evaluation Performed:  Follow-Up Visit  Chief Complaint:  Follow up CAD  History of Present Illness:    Rachel Kennedy is a 78 y.o. female with PMH of HTN, HLD, and  CAD.  Patient was admitted to the hospital on 02/09/2019 with code STEMI.  Initial EKG was consistent with anterior STEMI.   Initial troponin was 0.24.  Patient underwent emergent cardiac catheterization on 02/09/2019 which revealed 50% proximal to mid RCA lesion, 50% ostial to proximal left circumflex lesion, 80% mid to distal LAD lesion, 100% ostial to proximal LAD occlusion treated with Onyx resolute 3.0 x 30 mm DES, EF 25 to 35%, trivial 1+ MR.  Her troponin peaked at 57.07 before trending back down.  Echocardiogram obtained on the following day showed EF 35 to 40%, moderate hypokinesis of the LV basal anteroseptal and basal inferoseptal region, mild sclerosis of the aortic valve.   Patient was discharged on aspirin  and Brilinta  along with carvedilol  and losartan .  Blood pressure in the hospital was too low for Entresto.  She was admitted overnight on 05/07/19 with an episode of near syncope. She was not orthostatic. Troponin negative. Echo showed normalization of LV function. Carotid dopplers showed moderate bilateral disease with > 70% on the right. CTA revealed 60 % stenosis if left ICA and 50% in right ICA. Head CT was normal. Repeat carotid dopplers in November showed bilateral 60-79% disease. Repeat dopplers in Jan 2023 and Jan 2024 showed 50-79% LICA stenosis. Stable. Last Echo in Sept 2023 showed normal LV function.   On follow up today she is doing well. She is participating in the maintenance Cardiac Rehab maintenance program. BP has been well controlled there.  She has been on Wegovy  and has lost 31 lbs. She feels very well. No chest pain.  Past Medical  History:  Diagnosis Date   Acute systolic (congestive) heart failure (HCC)    CAD (coronary artery disease) 05/07/2019   Depression 05/07/2019   Hyperlipidemia    Hypertension    STEMI (ST elevation myocardial infarction) [facility])    Past Surgical History:  Procedure Laterality Date   CORONARY/GRAFT ACUTE MI REVASCULARIZATION N/A 02/09/2019   Procedure: CORONARY/GRAFT ACUTE MI REVASCULARIZATION;  Surgeon: [place], Carrianne Hyun M, MD;  Location: [facility] INVASIVE CV LAB;  Service: Cardiovascular;  Laterality: N/A;   FRACTURE SURGERY     LEFT HEART CATH AND CORONARY ANGIOGRAPHY N/A 02/09/2019   Procedure: LEFT HEART CATH AND CORONARY ANGIOGRAPHY;  Surgeon: [place], Karanvir Balderston M, MD;  Location: [facility] INVASIVE CV LAB;  Service: Cardiovascular;  Laterality: N/A;   OVARY SURGERY       Current Meds  Medication Sig   acetaminophen  (TYLENOL ) 500 MG tablet Take 500 mg by mouth every 6 (six) hours as needed for headache (pain).   aspirin  EC 81 MG tablet Take 1 tablet (81 mg total) by mouth daily. Swallow whole.   escitalopram (LEXAPRO) 10 MG tablet Take 10 mg by mouth daily.   losartan  (COZAAR ) 25 MG tablet TAKE 1 TABLET (25 MG TOTAL) BY MOUTH DAILY.   nitroGLYCERIN  (NITROSTAT ) 0.4 MG SL tablet Place 1 tablet (0.4 mg total) under the tongue every 5 (five) minutes x 3 doses as needed for chest pain.   Semaglutide -Weight Management (WEGOVY ) 2.4 MG/0.75ML SOAJ Inject 2.4 mg into the skin once a  week.   [DISCONTINUED] carvedilol  (COREG ) 3.125 MG tablet Take 1 tablet (3.125 mg total) by mouth 2 (two) times daily.   [DISCONTINUED] rosuvastatin  (CRESTOR ) 20 MG tablet Take 1 tablet (20 mg total) by mouth daily.     Allergies:   Patient has no known allergies.   Social History   Tobacco Use   Smoking status: Former   Smokeless tobacco: Never   Tobacco comments:    quit in 2017. Smoked for 30 years prior to that.   Vaping Use   Vaping status: Never Used  Substance Use Topics   Alcohol use: No    Alcohol/week: 0.0 standard  drinks of alcohol   Drug use: No     Family Hx: The patient's family history includes Heart disease in her father; Transient ischemic attack in her mother.  ROS:   Please see the history of present illness.    All other systems reviewed and are negative.   Prior CV studies:   The following studies were reviewed today:  Cath 02/09/2019 Prox RCA to Mid RCA lesion is 50% stenosed. Ost Cx to Prox Cx lesion is 30% stenosed. Mid LAD to Dist LAD lesion is 80% stenosed. Ost LAD to Prox LAD lesion is 100% stenosed. Post intervention, there is a 0% residual stenosis. A drug-eluting stent was successfully placed using a STENT RESOLUTE ONYX 3.0X30. There is severe left ventricular systolic dysfunction. LV end diastolic pressure is normal. The left ventricular ejection fraction is 25-35% by visual estimate. There is trivial (1+) mitral regurgitation.   1. 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 Diffuse 50% proximal to mid RCA 3. Severe LV dysfunction with anterolateral akinesis. EF 30-35%. 4. Normal LVEDP 5. Successful PCI with stenting of the proximal LAD with DES. There is persistent diffuse narrowing of the mid to distal LAD of unclear etiology ? Spasm   Plan: DAPT for one year. Aggrastat  for 6 hours. Optimize medical therapy with statin, beta blocker, ARB. If EF <35% by Echo consider Entresto. May also need to consider Lifevest at DC.      Echo 02/10/2019  1. The left ventricle has moderately reduced systolic function, with an ejection fraction of 35-40%. The cavity size was normal. Left ventricular diastolic Doppler parameters are consistent with impaired relaxation.  2. Severe hypokinesis of the left ventricular mid anteroseptum and inferoseptum, anteroapex and apical septum.  3. Moderate hypokinesis of the left ventricular basal anteroseptum and basal inferoseptum.  4. The tricuspid valve is normal in structure.  5. The pulmonic valve was normal in structure.  6. The  mitral valve is normal in structure.  7. The aortic valve is normal in structure. Mild sclerosis of the aortic valve.   SUMMARY   Endocardial border not optimally visualized for wall motion abnormalities, future exams should be performed with Definity IV LV Enhancement  Echo 05/07/19: IMPRESSIONS      1. The left ventricle has normal systolic function with an ejection fraction of 55-60%. The cavity size was normal. There is moderate concentric left ventricular hypertrophy. Left ventricular diastolic Doppler parameters are consistent with impaired  relaxation. There is hypokinesis of the mid anteroseptal and apical anterior walls.  2. The right ventricle has normal systolic function. The cavity was normal. There is no increase in right ventricular wall thickness.  3. Left atrial size was mildly dilated.    Carotid dopplers 05/07/19:  CLINICAL DATA:  Syncopal episode earlier today. History of CAD, hypertension and hyperlipidemia.   EXAM: BILATERAL CAROTID  DUPLEX ULTRASOUND   TECHNIQUE: Martina Sledge scale imaging, color Doppler and duplex ultrasound were performed of bilateral carotid and vertebral arteries in the neck.   COMPARISON:  None.   FINDINGS: Criteria: Quantification of carotid stenosis is based on velocity parameters that correlate the residual internal carotid diameter with NASCET-based stenosis levels, using the diameter of the distal internal carotid lumen as the denominator for stenosis measurement.   The following velocity measurements were obtained:   RIGHT   ICA: 177/28 cm/sec   CCA: 119/13 cm/sec   SYSTOLIC ICA/CCA RATIO:  1.5   ECA: 134 cm/sec   LEFT   ICA: 300/64 cm/sec   CCA: 113/11 cm/sec   SYSTOLIC ICA/CCA RATIO:  2.6   ECA: 141 cm/sec   RIGHT CAROTID ARTERY: There is a moderate amount of circumferential mixed echogenic plaque within the right carotid bulb (images 14 and 16), extending to involve the origin and proximal aspects of the right  internal carotid artery (is 24, which results in elevated peak systolic velocities throughout the interrogated course the right internal carotid artery. Greatest acquired peak systolic velocity within the mid aspect the right internal carotid artery measures 177 centimeters/second (image 29).   RIGHT VERTEBRAL ARTERY:  Antegrade Flow   LEFT CAROTID ARTERY: Circumferential intimal thickening/hypoechoic plaque throughout the left common carotid artery (image 41, 44 and 45). There is a moderate to large amount of eccentric mixed echogenic plaque within the left carotid bulb (image 50), extending to involve the origin and proximal aspect the left internal carotid artery (image 58, which results in elevated peak systolic velocities within the proximal mid aspects of the left internal carotid artery. Greatest acquired peak systolic velocity within the proximal left ICA measures 300 centimeters/second (image 60).   LEFT VERTEBRAL ARTERY:  Antegrade Flow   IMPRESSION: Moderate to large amount of bilateral atherosclerotic plaque, right greater than left, results in elevated peak systolic velocities within the bilateral internal carotid arteries, the right >70%, and the left compatible with the 50-69% luminal narrowing range.   Further evaluation with CTA could be performed as clinically indicated.     Electronically Signed   By: Robbi Childs M.D.   On: 05/07/2019 11:01  Echo 09/14/22: IMPRESSIONS     1. Left ventricular ejection fraction, by estimation, is 55 to 60%. The  left ventricle has normal function. The left ventricle demonstrates  regional wall motion abnormalities (see scoring diagram/findings for  description). There is moderate asymmetric  left ventricular hypertrophy of the basal segment. Left ventricular  diastolic parameters are consistent with Grade I diastolic dysfunction  (impaired relaxation).   2. Right ventricular systolic function is normal. The right  ventricular  size is normal. Tricuspid regurgitation signal is inadequate for assessing  PA pressure.   3. Left atrial size was upper normal.   4. The mitral valve is grossly normal. Trivial mitral valve  regurgitation.   5. The aortic valve is tricuspid. Aortic valve regurgitation is not  visualized. Aortic valve sclerosis is present, with no evidence of aortic  valve stenosis.   6. The inferior vena cava is normal in size with greater than 50%  respiratory variability, suggesting right atrial pressure of 3 mmHg.   Comparison(s): Prior images reviewed side by side. LVEF overall normal  range at 55-60%. Wall motion abnormalities more evidence on the current  study, previous images were less optimal.   Carotid dopplers 01/11/23: Summary:  Right Carotid: Velocities in the right ICA are consistent with a 1-39%  stenosis.  Left Carotid: Velocities in the left ICA are consistent with a 60-79%  stenosis.               Non-hemodynamically significant plaque <50% noted in the  CCA.   Vertebrals: Bilateral vertebral arteries demonstrate antegrade flow.  Subclavians: Normal flow hemodynamics were seen in bilateral subclavian               arteries.   *See table(s) above for measurements and observations.  Suggest follow up study in 12 months.    Electronically signed by Antionette Kirks MD on 01/13/2023 at 8:17:21 AM   Labs/Other Tests and Data Reviewed:    EKG:   is not done today.    Recent Labs: 08/18/2023: BUN 22; Creatinine, Ser 0.75; Hemoglobin 13.4; Platelets 181; Potassium 4.7; Sodium 140 03/17/2024: ALT 10   Recent Lipid Panel Lab Results  Component Value Date/Time   CHOL 100 03/17/2024 08:45 AM   TRIG 90 03/17/2024 08:45 AM   HDL 45 03/17/2024 08:45 AM   CHOLHDL 2.2 03/17/2024 08:45 AM   CHOLHDL 2.6 04/10/2019 11:12 AM   LDLCALC 37 03/17/2024 08:45 AM   Dated 05/10/20: A1c 5.9%. cholesterol 121, triglycerides 107, HDL 50, LDL 51. CBC, TSH normal. AST 55, ALT 54.     EKG Interpretation Date/Time:  Tuesday May 09 2024 11:40:03 EDT Ventricular Rate:  60 PR Interval:  166 QRS Duration:  92 QT Interval:  422 QTC Calculation: 422 R Axis:   47  Text Interpretation: Normal sinus rhythm Normal ECG When compared with ECG of March 27,2024 suspect reversal of leads V1 and V3. otherwise no change Confirmed by [place], Azaiah Licciardi [phone]) on 05/09/2024 11:45:24 AM    Wt Readings from Last 3 Encounters:  05/09/24 171 lb 9.6 oz (77.8 kg)  09/07/23 202 lb 3.2 oz (91.7 kg)  08/18/23 203 lb (92.1 kg)     Objective:    Vital Signs:  BP (!) 143/72   Pulse 60   Ht 5\' 6"  (1.676 m)   Wt 171 lb 9.6 oz (77.8 kg)   SpO2 98%   BMI 27.70 kg/m    GENERAL:  Well appearing WF in NAD HEENT:  PERRL, EOMI, sclera are clear. Oropharynx is clear. NECK:  No jugular venous distention, carotid upstroke brisk and symmetric, no bruits, no thyromegaly or adenopathy LUNGS:  Clear to auscultation bilaterally CHEST:  Unremarkable HEART:  RRR,  PMI not displaced or sustained,S1 and S2 within normal limits, no S3, no S4: no clicks, no rubs, no murmurs ABD:  Soft, nontender. BS +, no masses or bruits. No hepatomegaly, no splenomegaly EXT:  2 + pulses throughout, trace ankle edema, no cyanosis no clubbing SKIN:  Warm and dry.  No rashes NEURO:  Alert and oriented x 3. Cranial nerves II through XII intact. PSYCH:  Cognitively intact    ASSESSMENT & PLAN:    CAD: Anterior STEMI with DES to ostial LAD on February 09, 2019. Residual 80% mid to distal LAD lesion that will be treated medically. No angina. On ASA only now. Continue Coreg  and statin    Ischemic cardiomyopathy: Echo showed normalization of LV function.Continue carvedilol  and losartan .    Hypertension: Blood pressure is well controlled at Rehab   Hyperlipidemia: now on Crestor . Last LDL 37  5. Carotid arterial disease- moderate bilateral. Continue risk factor modification. Will update now  6.  Obesity. Excellent  response to Wegovy   7. Pre diabetes.    Medication Changes: Meds ordered this encounter  Medications  carvedilol  (COREG ) 3.125 MG tablet    Sig: Take 1 tablet (3.125 mg total) by mouth 2 (two) times daily.    Dispense:  180 tablet    Refill:  3    06/04/23 reduced from 6.25 mg bid due to bradycardia   rosuvastatin  (CRESTOR ) 20 MG tablet    Sig: Take 1 tablet (20 mg total) by mouth daily.    Dispense:  90 tablet    Refill:  3     Follow Up:  in one year  Signed, Britainy Kozub [place], MD  05/09/2024 11:54 AM    [facility]

## 2024-05-08 ENCOUNTER — Other Ambulatory Visit (HOSPITAL_COMMUNITY): Payer: Self-pay

## 2024-05-09 ENCOUNTER — Ambulatory Visit: Attending: Cardiology | Admitting: Cardiology

## 2024-05-09 ENCOUNTER — Encounter: Payer: Self-pay | Admitting: Cardiology

## 2024-05-09 VITALS — BP 143/72 | HR 60 | Ht 66.0 in | Wt 171.6 lb

## 2024-05-09 DIAGNOSIS — I10 Essential (primary) hypertension: Secondary | ICD-10-CM | POA: Diagnosis not present

## 2024-05-09 DIAGNOSIS — I2102 ST elevation (STEMI) myocardial infarction involving left anterior descending coronary artery: Secondary | ICD-10-CM | POA: Diagnosis not present

## 2024-05-09 DIAGNOSIS — I25118 Atherosclerotic heart disease of native coronary artery with other forms of angina pectoris: Secondary | ICD-10-CM

## 2024-05-09 DIAGNOSIS — E785 Hyperlipidemia, unspecified: Secondary | ICD-10-CM

## 2024-05-09 MED ORDER — ROSUVASTATIN CALCIUM 20 MG PO TABS: 20.0000 mg | ORAL_TABLET | Freq: Every day | ORAL | 3 refills | Status: AC

## 2024-05-09 MED ORDER — CARVEDILOL 3.125 MG PO TABS: 3.1250 mg | ORAL_TABLET | Freq: Two times a day (BID) | ORAL | 3 refills | Status: AC

## 2024-05-09 NOTE — Patient Instructions (Signed)
 Medication Instructions:  Continue same medications *If you need a refill on your cardiac medications before your next appointment, please call your pharmacy*  Lab Work: None ordered  Testing/Procedures: Carotid doppler  first available   Follow-Up: At [facility], you and your health needs are our priority.  As part of our continuing mission to provide you with exceptional heart care, our providers are all part of one team.  This team includes your primary Cardiologist (physician) and Advanced Practice Providers or APPs (Physician Assistants and Nurse Practitioners) who all work together to provide you with the care you need, when you need it.  Your next appointment:  1 year   Call in Feb to schedule May appointment     Provider:  Dr.Jordan    We recommend signing up for the patient portal called "MyChart".  Sign up information is provided on this After Visit Summary.  MyChart is used to connect with patients for Virtual Visits (Telemedicine).  Patients are able to view lab/test results, encounter notes, upcoming appointments, etc.  Non-urgent messages can be sent to your provider as well.   To learn more about what you can do with MyChart, go to [email or web address].

## 2024-06-06 ENCOUNTER — Ambulatory Visit: Payer: Self-pay | Admitting: Cardiology

## 2024-06-06 ENCOUNTER — Ambulatory Visit (HOSPITAL_COMMUNITY)
Admission: RE | Admit: 2024-06-06 | Discharge: 2024-06-06 | Disposition: A | Discharge status: Home / Self Care | Source: Ambulatory Visit | Attending: Cardiology | Admitting: Cardiology

## 2024-06-06 DIAGNOSIS — I6522 Occlusion and stenosis of left carotid artery: Secondary | ICD-10-CM

## 2024-06-06 DIAGNOSIS — I10 Essential (primary) hypertension: Secondary | ICD-10-CM

## 2024-06-06 DIAGNOSIS — E785 Hyperlipidemia, unspecified: Secondary | ICD-10-CM

## 2024-06-06 DIAGNOSIS — I25118 Atherosclerotic heart disease of native coronary artery with other forms of angina pectoris: Secondary | ICD-10-CM

## 2024-06-06 DIAGNOSIS — I2102 ST elevation (STEMI) myocardial infarction involving left anterior descending coronary artery: Secondary | ICD-10-CM

## 2024-06-09 ENCOUNTER — Other Ambulatory Visit: Payer: Self-pay

## 2024-06-09 DIAGNOSIS — I6523 Occlusion and stenosis of bilateral carotid arteries: Secondary | ICD-10-CM

## 2024-07-07 ENCOUNTER — Other Ambulatory Visit (HOSPITAL_COMMUNITY): Payer: Self-pay

## 2024-08-02 ENCOUNTER — Other Ambulatory Visit (HOSPITAL_COMMUNITY): Payer: Self-pay

## 2024-09-01 ENCOUNTER — Other Ambulatory Visit (HOSPITAL_COMMUNITY): Payer: Self-pay | Admitting: Family Medicine

## 2024-09-01 DIAGNOSIS — Z1231 Encounter for screening mammogram for malignant neoplasm of breast: Secondary | ICD-10-CM

## 2024-09-06 ENCOUNTER — Other Ambulatory Visit (HOSPITAL_COMMUNITY): Payer: Self-pay

## 2024-09-20 ENCOUNTER — Encounter (HOSPITAL_COMMUNITY): Payer: Self-pay

## 2024-09-20 ENCOUNTER — Ambulatory Visit (HOSPITAL_COMMUNITY)
Admission: RE | Admit: 2024-09-20 | Discharge: 2024-09-20 | Disposition: A | Discharge status: Home / Self Care | Source: Ambulatory Visit | Attending: Family Medicine | Admitting: Family Medicine

## 2024-09-20 DIAGNOSIS — Z1231 Encounter for screening mammogram for malignant neoplasm of breast: Secondary | ICD-10-CM | POA: Diagnosis present

## 2024-10-04 ENCOUNTER — Other Ambulatory Visit: Payer: Self-pay | Admitting: Cardiology

## 2024-10-04 DIAGNOSIS — I2102 ST elevation (STEMI) myocardial infarction involving left anterior descending coronary artery: Secondary | ICD-10-CM

## 2024-10-04 DIAGNOSIS — I25118 Atherosclerotic heart disease of native coronary artery with other forms of angina pectoris: Secondary | ICD-10-CM

## 2024-10-05 ENCOUNTER — Other Ambulatory Visit (HOSPITAL_COMMUNITY): Payer: Self-pay

## 2024-10-05 MED ORDER — WEGOVY 2.4 MG/0.75ML ~~LOC~~ SOAJ
2.4000 mg | SUBCUTANEOUS | 5 refills | Status: AC
  Filled 2024-10-05: qty 3, 28d supply, fill #0
  Filled 2024-11-01: qty 3, 28d supply, fill #1
  Filled 2024-11-28: qty 3, 28d supply, fill #2
  Filled 2024-12-26 – 2024-12-28 (×2): qty 3, 28d supply, fill #3
  Filled 2025-01-23: qty 3, 28d supply, fill #4

## 2024-12-27 ENCOUNTER — Other Ambulatory Visit: Payer: Self-pay

## 2024-12-27 ENCOUNTER — Encounter: Payer: Self-pay | Admitting: Pharmacy Technician

## 2024-12-28 ENCOUNTER — Other Ambulatory Visit (HOSPITAL_COMMUNITY): Payer: Self-pay

## 2025-01-23 ENCOUNTER — Other Ambulatory Visit (HOSPITAL_COMMUNITY): Payer: Self-pay

## 2025-01-25 ENCOUNTER — Other Ambulatory Visit (HOSPITAL_COMMUNITY): Payer: Self-pay | Admitting: Family Medicine

## 2025-01-25 DIAGNOSIS — M19019 Primary osteoarthritis, unspecified shoulder: Secondary | ICD-10-CM

## 2025-02-02 ENCOUNTER — Other Ambulatory Visit (HOSPITAL_COMMUNITY)
